# Patient Record
Sex: Male | Born: 1965
Health system: Southern US, Community
[De-identification: ages and names within clinical notes are randomized; demographics above are authoritative.]

## PROBLEM LIST (undated history)

## (undated) DIAGNOSIS — K219 Gastro-esophageal reflux disease without esophagitis: Secondary | ICD-10-CM

## (undated) DIAGNOSIS — E785 Hyperlipidemia, unspecified: Secondary | ICD-10-CM

## (undated) DIAGNOSIS — E119 Type 2 diabetes mellitus without complications: Secondary | ICD-10-CM

## (undated) DIAGNOSIS — I10 Essential (primary) hypertension: Secondary | ICD-10-CM

## (undated) HISTORY — PX: NO PAST SURGERIES: SHX2092

## (undated) HISTORY — DX: Hyperlipidemia, unspecified: E78.5

## (undated) HISTORY — PX: WISDOM TOOTH EXTRACTION: SHX21

---

## 1997-11-13 ENCOUNTER — Emergency Department (HOSPITAL_COMMUNITY): Admission: EM | Admit: 1997-11-13 | Discharge: 1997-11-13 | Payer: Self-pay | Admitting: Emergency Medicine

## 2000-07-29 ENCOUNTER — Emergency Department (HOSPITAL_COMMUNITY): Admission: EM | Admit: 2000-07-29 | Discharge: 2000-07-29 | Payer: Self-pay | Admitting: Emergency Medicine

## 2000-07-29 ENCOUNTER — Encounter: Payer: Self-pay | Admitting: Emergency Medicine

## 2003-08-03 ENCOUNTER — Emergency Department (HOSPITAL_COMMUNITY): Admission: EM | Admit: 2003-08-03 | Discharge: 2003-08-03 | Payer: Self-pay | Admitting: Emergency Medicine

## 2004-03-24 ENCOUNTER — Emergency Department (HOSPITAL_COMMUNITY): Admission: EM | Admit: 2004-03-24 | Discharge: 2004-03-24 | Payer: Self-pay | Admitting: Emergency Medicine

## 2004-04-01 ENCOUNTER — Emergency Department (HOSPITAL_COMMUNITY): Admission: EM | Admit: 2004-04-01 | Discharge: 2004-04-01 | Payer: Self-pay | Admitting: Family Medicine

## 2004-08-10 ENCOUNTER — Emergency Department (HOSPITAL_COMMUNITY): Admission: EM | Admit: 2004-08-10 | Discharge: 2004-08-10 | Payer: Self-pay | Admitting: Emergency Medicine

## 2005-03-11 ENCOUNTER — Emergency Department (HOSPITAL_COMMUNITY): Admission: EM | Admit: 2005-03-11 | Discharge: 2005-03-11 | Payer: Self-pay | Admitting: Emergency Medicine

## 2006-01-25 ENCOUNTER — Emergency Department (HOSPITAL_COMMUNITY): Admission: EM | Admit: 2006-01-25 | Discharge: 2006-01-25 | Payer: Self-pay | Admitting: Emergency Medicine

## 2006-01-27 ENCOUNTER — Emergency Department (HOSPITAL_COMMUNITY): Admission: EM | Admit: 2006-01-27 | Discharge: 2006-01-27 | Payer: Self-pay | Admitting: Emergency Medicine

## 2006-04-27 ENCOUNTER — Emergency Department (HOSPITAL_COMMUNITY): Admission: EM | Admit: 2006-04-27 | Discharge: 2006-04-27 | Payer: Self-pay | Admitting: Emergency Medicine

## 2007-04-18 ENCOUNTER — Emergency Department (HOSPITAL_COMMUNITY): Admission: EM | Admit: 2007-04-18 | Discharge: 2007-04-18 | Payer: Self-pay | Admitting: Emergency Medicine

## 2007-09-14 ENCOUNTER — Emergency Department (HOSPITAL_COMMUNITY): Admission: EM | Admit: 2007-09-14 | Discharge: 2007-09-14 | Payer: Self-pay | Admitting: Emergency Medicine

## 2009-01-01 ENCOUNTER — Emergency Department (HOSPITAL_COMMUNITY): Admission: EM | Admit: 2009-01-01 | Discharge: 2009-01-01 | Payer: Self-pay | Admitting: Emergency Medicine

## 2009-03-17 ENCOUNTER — Emergency Department (HOSPITAL_COMMUNITY): Admission: EM | Admit: 2009-03-17 | Discharge: 2009-03-17 | Payer: Self-pay | Admitting: Emergency Medicine

## 2010-05-21 LAB — GC/CHLAMYDIA PROBE AMP, GENITAL
Chlamydia, DNA Probe: NEGATIVE
GC Probe Amp, Genital: NEGATIVE

## 2010-11-10 LAB — URINE MICROSCOPIC-ADD ON

## 2010-11-10 LAB — URINALYSIS, ROUTINE W REFLEX MICROSCOPIC
Bilirubin Urine: NEGATIVE
Hgb urine dipstick: NEGATIVE
Specific Gravity, Urine: 1.023
pH: 5.5

## 2010-11-10 LAB — URINE CULTURE

## 2010-11-10 LAB — GC/CHLAMYDIA PROBE AMP, GENITAL: Chlamydia, DNA Probe: NEGATIVE

## 2010-11-14 LAB — COMPREHENSIVE METABOLIC PANEL
ALT: 56 — ABNORMAL HIGH
Alkaline Phosphatase: 92
BUN: 9
CO2: 26
GFR calc non Af Amer: >60
Glucose, Bld: 118 — ABNORMAL HIGH
Potassium: 3.9
Total Bilirubin: 1
Total Protein: 6.8

## 2010-11-14 LAB — CBC
HCT: 47.1
Hemoglobin: 15.8
RBC: 5.24
RDW: 13.2

## 2010-11-14 LAB — DIFFERENTIAL
Basophils Absolute: 0.1
Basophils Relative: 1
Eosinophils Absolute: 0.1
Monocytes Relative: 8
Neutro Abs: 7.2
Neutrophils Relative %: 67

## 2010-11-14 LAB — LIPASE, BLOOD: Lipase: 18

## 2010-11-14 LAB — POCT CARDIAC MARKERS: Myoglobin, poc: 55.4

## 2010-11-14 LAB — D-DIMER, QUANTITATIVE: D-Dimer, Quant: 0.25

## 2011-07-13 ENCOUNTER — Encounter (HOSPITAL_COMMUNITY): Payer: Self-pay | Admitting: Emergency Medicine

## 2011-07-13 ENCOUNTER — Emergency Department (HOSPITAL_COMMUNITY)
Admission: EM | Admit: 2011-07-13 | Discharge: 2011-07-13 | Disposition: A | Discharge status: Home / Self Care | Payer: Self-pay | Attending: Emergency Medicine | Admitting: Emergency Medicine

## 2011-07-13 DIAGNOSIS — I10 Essential (primary) hypertension: Secondary | ICD-10-CM | POA: Insufficient documentation

## 2011-07-13 DIAGNOSIS — M545 Low back pain, unspecified: Secondary | ICD-10-CM | POA: Insufficient documentation

## 2011-07-13 DIAGNOSIS — Y92410 Unspecified street and highway as the place of occurrence of the external cause: Secondary | ICD-10-CM | POA: Insufficient documentation

## 2011-07-13 HISTORY — DX: Essential (primary) hypertension: I10

## 2011-07-13 MED ORDER — CYCLOBENZAPRINE HCL 10 MG PO TABS: 10.0000 mg | ORAL_TABLET | Freq: Two times a day (BID) | ORAL | Status: AC | PRN

## 2011-07-13 MED ORDER — ACETAMINOPHEN 325 MG PO TABS
650.0000 mg | ORAL_TABLET | Freq: Once | ORAL | Status: AC
Start: 2011-07-13 — End: 2011-07-13
  Administered 2011-07-13: 650 mg via ORAL
  Filled 2011-07-13: qty 2

## 2011-07-13 MED ORDER — HYDROCODONE-ACETAMINOPHEN 5-325 MG PO TABS: 1.0000 | ORAL_TABLET | ORAL | Status: AC | PRN
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
## 2011-07-13 NOTE — Discharge Instructions (Signed)
Back Exercises   Back exercises help treat and prevent back injuries. The goal of back exercises is to increase the strength of your abdominal and back muscles and the flexibility of your back. These exercises should be started when you no longer have back pain. Back exercises include:   Pelvic Tilt. Lie on your back with your knees bent. Tilt your pelvis until the lower part of your back is against the floor. Hold this position 5 to 10 sec and repeat 5 to 10 times.   Knee to Chest. Pull first 1 knee up against your chest and hold for 20 to 30 seconds, repeat this with the other knee, and then both knees. This may be done with the other leg straight or bent, whichever feels better.   Sit-Ups or Curl-Ups. Bend your knees 90 degrees. Start with tilting your pelvis, and do a partial, slow sit-up, lifting your trunk only 30 to 45 degrees off the floor. Take at least 2 to 3 seconds for each sit-up. Do not do sit-ups with your knees out straight. If partial sit-ups are difficult, simply do the above but with only tightening your abdominal muscles and holding it as directed.   Hip-Lift. Lie on your back with your knees flexed 90 degrees. Push down with your feet and shoulders as you raise your hips a couple inches off the floor; hold for 10 seconds, repeat 5 to 10 times.   Back arches. Lie on your stomach, propping yourself up on bent elbows. Slowly press on your hands, causing an arch in your low back. Repeat 3 to 5 times. Any initial stiffness and discomfort should lessen with repetition over time.   Shoulder-Lifts. Lie face down with arms beside your body. Keep hips and torso pressed to floor as you slowly lift your head and shoulders off the floor.   Do not overdo your exercises, especially in the beginning. Exercises may cause you some mild back discomfort which lasts for a few minutes; however, if the pain is more severe, or lasts for more than 15 minutes, do not continue exercises until you see your caregiver.  Improvement with exercise therapy for back problems is slow.   See your caregivers for assistance with developing a proper back exercise program.   Document Released: 03/12/2004 Document Revised: 01/22/2011 Document Reviewed: 02/02/2005   Exit[employer]® Patient Information ©2012 [employer].     Back Pain, Adult   Low back pain is very common. About 1 in 5 people have back pain. The cause of low back pain is rarely dangerous. The pain often gets better over time. About half of people with a sudden onset of back pain feel better in just 2 weeks. About 8 in 10 people feel better by 6 weeks.   CAUSES   Some common causes of back pain include:   Strain of the muscles or ligaments supporting the spine.   Wear and tear (degeneration) of the spinal discs.   Arthritis.   Direct injury to the back.   DIAGNOSIS   Most of the time, the direct cause of low back pain is not known. However, back pain can be treated effectively even when the exact cause of the pain is unknown. Answering your caregiver's questions about your overall health and symptoms is one of the most accurate ways to make sure the cause of your pain is not dangerous. If your caregiver needs more information, he or she may order lab work or imaging tests (X-rays or MRIs). However, even   if imaging tests show changes in your back, this usually does not require surgery.   HOME CARE INSTRUCTIONS   For many people, back pain returns. Since low back pain is rarely dangerous, it is often a condition that people can learn to manage on their own.   Remain active. It is stressful on the back to sit or stand in one place. Do not sit, drive, or stand in one place for more than 30 minutes at a time. Take short walks on level surfaces as soon as pain allows. Try to increase the length of time you walk each day.   Do not stay in bed. Resting more than 1 or 2 days can delay your recovery.   Do not avoid exercise or work. Your body is made to move. It is not dangerous to be active,  even though your back may hurt. Your back will likely heal faster if you return to being active before your pain is gone.   Pay attention to your body when you bend and lift. Many people have less discomfort when lifting if they bend their knees, keep the load close to their bodies, and avoid twisting. Often, the most comfortable positions are those that put less stress on your recovering back.   Find a comfortable position to sleep. Use a firm mattress and lie on your side with your knees slightly bent. If you lie on your back, put a pillow under your knees.   Only take over-the-counter or prescription medicines as directed by your caregiver. Over-the-counter medicines to reduce pain and inflammation are often the most helpful. Your caregiver may prescribe muscle relaxant drugs. These medicines help dull your pain so you can more quickly return to your normal activities and healthy exercise.   Put ice on the injured area.   Put ice in a plastic bag.   Place a towel between your skin and the bag.   Leave the ice on for 15 to 20 minutes, 3 to 4 times a day for the first 2 to 3 days. After that, ice and heat may be alternated to reduce pain and spasms.   Ask your caregiver about trying back exercises and gentle massage. This may be of some benefit.   Avoid feeling anxious or stressed. Stress increases muscle tension and can worsen back pain. It is important to recognize when you are anxious or stressed and learn ways to manage it. Exercise is a great option.   SEEK MEDICAL CARE IF:   You have pain that is not relieved with rest or medicine.   You have pain that does not improve in 1 week.   You have new symptoms.   You are generally not feeling well.   SEEK IMMEDIATE MEDICAL CARE IF:   You have pain that radiates from your back into your legs.   You develop new bowel or bladder control problems.   You have unusual weakness or numbness in your arms or legs.   You develop nausea or vomiting.   You develop abdominal  pain.   You feel faint.   Document Released: 02/02/2005 Document Revised: 01/22/2011 Document Reviewed: 06/23/2010   ExitCare® Patient Information ©2012 [employer].
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
## 2011-07-15 NOTE — ED Provider Notes (Signed)
Medical screening examination/treatment/procedure(s) were performed by non-physician practitioner and as supervising physician I was immediately available for consultation/collaboration.  Flint Melter, MD 07/15/11 [phone]

## 2012-07-09 ENCOUNTER — Emergency Department (HOSPITAL_COMMUNITY): Admission: EM | Admit: 2012-07-09 | Discharge: 2012-07-09 | Discharge status: Against Medical Advice | Payer: Self-pay

## 2012-07-09 NOTE — ED Notes (Signed)
Registration clerk states that pt did not want to wait to be seen and left ED.

## 2016-02-04 ENCOUNTER — Emergency Department (HOSPITAL_COMMUNITY): Payer: No Typology Code available for payment source

## 2016-02-04 ENCOUNTER — Emergency Department (HOSPITAL_COMMUNITY)
Admission: EM | Admit: 2016-02-04 | Discharge: 2016-02-04 | Disposition: A | Discharge status: Home / Self Care | Payer: No Typology Code available for payment source | Attending: Emergency Medicine | Admitting: Emergency Medicine

## 2016-02-04 ENCOUNTER — Encounter (HOSPITAL_COMMUNITY): Payer: Self-pay

## 2016-02-04 DIAGNOSIS — Y939 Activity, unspecified: Secondary | ICD-10-CM | POA: Insufficient documentation

## 2016-02-04 DIAGNOSIS — I10 Essential (primary) hypertension: Secondary | ICD-10-CM | POA: Diagnosis not present

## 2016-02-04 DIAGNOSIS — F17210 Nicotine dependence, cigarettes, uncomplicated: Secondary | ICD-10-CM | POA: Diagnosis not present

## 2016-02-04 DIAGNOSIS — Y999 Unspecified external cause status: Secondary | ICD-10-CM | POA: Diagnosis not present

## 2016-02-04 DIAGNOSIS — Y92481 Parking lot as the place of occurrence of the external cause: Secondary | ICD-10-CM | POA: Diagnosis not present

## 2016-02-04 DIAGNOSIS — M25512 Pain in left shoulder: Secondary | ICD-10-CM | POA: Insufficient documentation

## 2016-02-04 MED ORDER — METHOCARBAMOL 500 MG PO TABS: 500.0000 mg | ORAL_TABLET | Freq: Two times a day (BID) | ORAL | 0 refills | Status: DC

## 2016-02-04 MED ORDER — LIDOCAINE 5 % EX PTCH: 1.0000 | MEDICATED_PATCH | CUTANEOUS | 0 refills | Status: DC

## 2016-02-04 MED ORDER — NAPROXEN 500 MG PO TABS: 500.0000 mg | ORAL_TABLET | Freq: Two times a day (BID) | ORAL | 0 refills | Status: DC

## 2016-02-04 MED ORDER — IBUPROFEN 200 MG PO TABS
600.0000 mg | ORAL_TABLET | Freq: Once | ORAL | Status: AC
  Administered 2016-02-04: 600 mg via ORAL
  Filled 2016-02-04: qty 3
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
## 2016-02-04 NOTE — Discharge Instructions (Signed)
There were no abnormalities on the x-ray today. Expect your soreness to increase over the next 2-3 days. Take it easy, but do not lay around too much as this may make any stiffness worse. Take 500 mg of naproxen every 12 hours or 800 mg of ibuprofen every 8 hours for the next 3 days. Take these medications with food to avoid upset stomach. Robaxin is a muscle relaxer and may help loosen stiff muscles. Do not take the Robaxin while driving or performing other dangerous activities. Be sure to perform the attached exercises starting with three times a week and working up to performing them daily. This is an essential part of preventing long term problems. Follow up with a primary care provider for any future management of these complaints.

## 2017-02-11 ENCOUNTER — Encounter: Payer: Self-pay | Admitting: Specialist

## 2017-03-15 ENCOUNTER — Emergency Department (HOSPITAL_COMMUNITY): Payer: BLUE CROSS/BLUE SHIELD

## 2017-03-15 ENCOUNTER — Encounter (HOSPITAL_COMMUNITY): Payer: Self-pay | Admitting: Emergency Medicine

## 2017-03-15 ENCOUNTER — Inpatient Hospital Stay (HOSPITAL_COMMUNITY)
Admission: EM | Admit: 2017-03-15 | Discharge: 2017-03-19 | DRG: 645 | Disposition: A | Discharge status: Home / Self Care | Payer: BLUE CROSS/BLUE SHIELD | Attending: Internal Medicine | Admitting: Internal Medicine

## 2017-03-15 DIAGNOSIS — D497 Neoplasm of unspecified behavior of endocrine glands and other parts of nervous system: Secondary | ICD-10-CM | POA: Diagnosis not present

## 2017-03-15 DIAGNOSIS — M21372 Foot drop, left foot: Secondary | ICD-10-CM | POA: Diagnosis present

## 2017-03-15 DIAGNOSIS — I10 Essential (primary) hypertension: Secondary | ICD-10-CM | POA: Diagnosis not present

## 2017-03-15 DIAGNOSIS — M899 Disorder of bone, unspecified: Secondary | ICD-10-CM

## 2017-03-15 DIAGNOSIS — Z79899 Other long term (current) drug therapy: Secondary | ICD-10-CM

## 2017-03-15 DIAGNOSIS — F17210 Nicotine dependence, cigarettes, uncomplicated: Secondary | ICD-10-CM | POA: Diagnosis present

## 2017-03-15 DIAGNOSIS — R209 Unspecified disturbances of skin sensation: Secondary | ICD-10-CM

## 2017-03-15 DIAGNOSIS — E1142 Type 2 diabetes mellitus with diabetic polyneuropathy: Secondary | ICD-10-CM | POA: Diagnosis present

## 2017-03-15 DIAGNOSIS — G9589 Other specified diseases of spinal cord: Secondary | ICD-10-CM | POA: Diagnosis present

## 2017-03-15 DIAGNOSIS — F1010 Alcohol abuse, uncomplicated: Secondary | ICD-10-CM | POA: Diagnosis not present

## 2017-03-15 DIAGNOSIS — E119 Type 2 diabetes mellitus without complications: Secondary | ICD-10-CM

## 2017-03-15 DIAGNOSIS — Z794 Long term (current) use of insulin: Secondary | ICD-10-CM

## 2017-03-15 DIAGNOSIS — M21371 Foot drop, right foot: Secondary | ICD-10-CM

## 2017-03-15 DIAGNOSIS — R531 Weakness: Secondary | ICD-10-CM | POA: Diagnosis not present

## 2017-03-15 DIAGNOSIS — E1165 Type 2 diabetes mellitus with hyperglycemia: Secondary | ICD-10-CM | POA: Diagnosis present

## 2017-03-15 DIAGNOSIS — IMO0001 Reserved for inherently not codable concepts without codable children: Secondary | ICD-10-CM

## 2017-03-15 HISTORY — DX: Type 2 diabetes mellitus without complications: E11.9

## 2017-03-15 LAB — BASIC METABOLIC PANEL
ANION GAP: 6 (ref 5–15)
BUN: 13 mg/dL (ref 6–20)
CO2: 27 mmol/L (ref 22–32)
Calcium: 9.5 mg/dL (ref 8.9–10.3)
Chloride: 106 mmol/L (ref 101–111)
Creatinine, Ser: 0.67 mg/dL (ref 0.61–1.24)
Glucose, Bld: 208 mg/dL — ABNORMAL HIGH (ref 65–99)
POTASSIUM: 3.8 mmol/L (ref 3.5–5.1)
SODIUM: 139 mmol/L (ref 135–145)

## 2017-03-15 LAB — MAGNESIUM: MAGNESIUM: 1.9 mg/dL (ref 1.7–2.4)

## 2017-03-15 LAB — CBC WITH DIFFERENTIAL/PLATELET
BASOS ABS: 0 10*3/uL (ref 0.0–0.1)
BASOS PCT: 0 %
EOS ABS: 0.1 10*3/uL (ref 0.0–0.7)
EOS PCT: 1 %
HCT: 43.4 % (ref 39.0–52.0)
Hemoglobin: 14.6 g/dL (ref 13.0–17.0)
LYMPHS ABS: 3.3 10*3/uL (ref 0.7–4.0)
LYMPHS PCT: 34 %
MCH: 29.5 pg (ref 26.0–34.0)
MCHC: 33.6 g/dL (ref 30.0–36.0)
MCV: 87.7 fL (ref 78.0–100.0)
Monocytes Absolute: 0.8 10*3/uL (ref 0.1–1.0)
Monocytes Relative: 8 %
NEUTROS ABS: 5.3 10*3/uL (ref 1.7–7.7)
NEUTROS PCT: 57 %
PLATELETS: 327 10*3/uL (ref 150–400)
RBC: 4.95 MIL/uL (ref 4.22–5.81)
RDW: 12 % (ref 11.5–15.5)
WBC: 9.5 10*3/uL (ref 4.0–10.5)

## 2017-03-15 LAB — ETHANOL: Alcohol, Ethyl (B): <10 mg/dL (ref <10)

## 2017-03-15 LAB — VITAMIN B12: Vitamin B-12: 1224 pg/mL — ABNORMAL HIGH (ref 180–914)

## 2017-03-15 LAB — CK: Total CK: 77 U/L (ref 49–397)

## 2017-03-15 MED ORDER — GADOBENATE DIMEGLUMINE 529 MG/ML IV SOLN
20.0000 mL | Freq: Once | INTRAVENOUS | Status: AC | PRN
  Administered 2017-03-15: 17 mL via INTRAVENOUS

## 2017-03-15 NOTE — ED Notes (Addendum)
MRI states unable to do the 3 MRIs ordered tonight due to the fact that pt has already had contrast, so must be done tomorrow

## 2017-03-15 NOTE — ED Notes (Signed)
Patient transported to MRI 

## 2017-03-15 NOTE — ED Provider Notes (Signed)
[facility] DEPT Provider Note   CSN: [id number] Arrival date & time: 03/15/17  1304     History   Chief Complaint Chief Complaint  Patient presents with  . Foot Pain    HPI Kyle Novak is a 52 y.o. male with a PMHx of HTN and DM2, who presents to the ED with complaints of bilateral feet and leg paresthesias x 1 month with associated left worse than right foot drop/weakness.  Patient states that one month ago he started having paresthesias and tingling in his feet which occasionally radiate up to his thighs, describes the pain as 6/10 constant tingling in both feet, which seems to worsen when he drinks alcohol, with no treatments tried prior to arrival and no known alleviating factors.  He states that he started noticing that when he walks he has to lift his legs up high because his feet won't come up properly and he tends to slap them down on the ground when he walks.  He has been having worsening weakness in his left foot more than his right, but both feet seem to not dorsiflex properly, and occasionally he'll trip and fall when his feet don't clear the ground fully.  He is compliant with his Tresiba 22 units every evening as well as his Hyzaar, he is not on any other medications at this time.  He denies any injuries or trauma to his feet, legs, or back.  He states that he had some back pain last week, but that self resolved.  He admits that he drinks alcohol almost daily, usually about 2-4 shots of liquor and a few beers, occasionally he will have more up to a pint of liquor in addition to a few beers.  His PCP is at the [facility] clinic.  Of note, he has an appt with a neurologist on 03/31/17 for this issue, that his PCP made him; he's never seen a neurologist before, and he doesn't know the name of the neurologist that he's got the appt with next month.    He denies fevers, chills, CP, SOB, abd pain, N/V/D/C, hematuria, dysuria, incontinence of urine/stool,  saddle anesthesia/cauda equina symptoms, focal myalgias/arthralgias, complete numbness, or any other complaints at this time.   The history is provided by the patient and medical records. No language interpreter was used.  Foot Pain  This is a new problem. The current episode started more than 1 week ago. The problem occurs constantly. The problem has been gradually worsening. Pertinent negatives include no chest pain, no abdominal pain and no shortness of breath. The symptoms are aggravated by drinking. Nothing relieves the symptoms. He has tried nothing for the symptoms. The treatment provided no relief.    Past Medical History:  Diagnosis Date  . Diabetes mellitus without complication ([place])   . Hypertension     There are no active problems to display for this patient.   History reviewed. No pertinent surgical history.     Home Medications    Prior to Admission medications   Medication Sig Start Date End Date Taking? Authorizing Provider  losartan-hydrochlorothiazide (HYZAAR) 100-12.5 MG tablet Take 1 tablet by mouth daily. 01/22/17  Yes [provider]  Multiple Vitamin (MULITIVITAMIN WITH MINERALS) TABS Take 1 tablet by mouth daily.   Yes [provider]  TRESIBA FLEXTOUCH 200 UNIT/ML SOPN Inject 1 each into the skin daily. 01/22/17  Yes [provider]  lidocaine (LIDODERM) 5 % Place 1 patch onto the skin daily. Remove &  Discard patch within 12 hours or as directed by MD Patient not taking: Reported on 03/15/2017 02/04/16   Joy, Helane Gunther, PA-C  methocarbamol (ROBAXIN) 500 MG tablet Take 1 tablet (500 mg total) by mouth 2 (two) times daily. Patient not taking: Reported on 03/15/2017 02/04/16   Joy, Raquel Sarna C, PA-C  naproxen (NAPROSYN) 500 MG tablet Take 1 tablet (500 mg total) by mouth 2 (two) times daily. Patient not taking: Reported on 03/15/2017 02/04/16   Lorayne Bender, PA-C    Family History No family history on file.  Social History Social History     Tobacco Use  . Smoking status: Current Every Day Smoker    Types: Cigarettes  . Smokeless tobacco: Never Used  Substance Use Topics  . Alcohol use: Yes    Comment: OCCASIONAL  . Drug use: No     Allergies   Patient has no known allergies.   Review of Systems Review of Systems  Constitutional: Negative for chills and fever.  Respiratory: Negative for shortness of breath.   Cardiovascular: Negative for chest pain.  Gastrointestinal: Negative for abdominal pain, constipation, diarrhea, nausea and vomiting.  Genitourinary: Negative for difficulty urinating (no incontinence), dysuria and hematuria.  Musculoskeletal: Positive for gait problem. Negative for arthralgias, back pain and myalgias.  Skin: Negative for color change.  Allergic/Immunologic: Positive for immunocompromised state (DM2).  Neurological: Positive for weakness (feet/legs) and numbness (paresthesias feet/legs).  Psychiatric/Behavioral: Negative for confusion.   All other systems reviewed and are negative for acute change except as noted in the HPI.    Physical Exam Updated Vital Signs BP (!) 138/104 (BP Location: Left Arm)   Pulse 98   Temp 98.2 F (36.8 C) (Oral)   Resp 16   SpO2 100%   Physical Exam  Constitutional: He is oriented to person, place, and time. Vital signs are normal. He appears well-developed and well-nourished.  Non-toxic appearance. No distress.  Afebrile, nontoxic, NAD  HENT:  Head: Normocephalic and atraumatic.  Mouth/Throat: Oropharynx is clear and moist and mucous membranes are normal.  Eyes: Conjunctivae and EOM are normal. Right eye exhibits no discharge. Left eye exhibits no discharge.  Neck: Normal range of motion. Neck supple.  Cardiovascular: Normal rate, regular rhythm, normal heart sounds and intact distal pulses. Exam reveals no gallop and no friction rub.  No murmur heard. Pulmonary/Chest: Effort normal and breath sounds normal. No respiratory distress. He has no  decreased breath sounds. He has no wheezes. He has no rhonchi. He has no rales.  Abdominal: Soft. Normal appearance and bowel sounds are normal. He exhibits no distension. There is no tenderness. There is no rigidity, no rebound, no guarding, no CVA tenderness, no tenderness at McBurney's point and negative Murphy's sign.  Musculoskeletal: Normal range of motion.       Lumbar back: Normal.  All spinal levels with FROM intact without spinous process TTP, no bony stepoffs or deformities, no paraspinous muscle TTP or muscle spasms. No overlying skin changes. Neg SLR bilaterally. No focal bony TTP to BLEs, no swelling or crepitus, no deformities. No tenderness to fibular head region.  B/l foot drop with weakness on dorsiflexion L>R, plantarflexion strength 5/5 bilaterally, R great toe extension 4/5 but L great toe extension 3/5, globally diminished sensation on dorsums of feet and up b/l legs without specific dermatomal distribution, sensation on plantar aspects of feet intact, babinski's downgoing bilaterally. Knee/hip strength grossly intact, distal pulses intact. Soft compartments. Pt has to lift legs up higher at from hips  in order to clear his feet when ambulating due to the foot drop.   Neurological: He is alert and oriented to person, place, and time. A sensory deficit is present. Gait abnormal. He displays no Babinski's sign on the right side. He displays no Babinski's sign on the left side.  See MsK exam  Skin: Skin is warm, dry and intact. No rash noted.  Psychiatric: He has a normal mood and affect.  Nursing note and vitals reviewed.    ED Treatments / Results  Labs (all labs ordered are listed, but only abnormal results are displayed) Labs Reviewed  BASIC METABOLIC PANEL - Abnormal; Notable for the following components:      Result Value   Glucose, Bld 208 (*)    All other components within normal limits  CBC WITH DIFFERENTIAL/PLATELET  MAGNESIUM  CK  ETHANOL  VITAMIN B12     EKG  EKG Interpretation None       Radiology Mr Lumbar Spine W Wo Contrast  Result Date: 03/15/2017 CLINICAL DATA:  Back pain for 1 month. Multiple falls. Tingling in both legs. EXAM: MRI LUMBAR SPINE WITHOUT AND WITH CONTRAST TECHNIQUE: Multiplanar and multiecho pulse sequences of the lumbar spine were obtained without and with intravenous contrast. CONTRAST:  82mL MULTIHANCE GADOBENATE DIMEGLUMINE 529 MG/ML IV SOLN COMPARISON:  None. FINDINGS: Segmentation:  Standard. Alignment:  Anatomic. Vertebrae:  No worrisome osseous lesion. Conus medullaris and cauda equina: Conus extends to the L1 level. Normal signal and morphology to the conus. At the L2-3 level, there is an enhancing 5 mm spherical lesion, midline location intradural, which is adjacent to the filum terminale and adjacent to multiple cauda equina nerve roots. The lesion demonstrates intrinsic T1 and T2 hypointensity, with a small area of suspected central necrosis. No other similar lesions are seen. Paraspinal and other soft tissues: Unremarkable. Disc levels: L1-L2:  Normal. L2-L3:  Normal. L3-L4:  Normal disc space.  Mild facet arthropathy.  No impingement. L4-L5:  Normal disc space.  Mild facet arthropathy.  No impingement. L5-S1: Central protrusion. Mild facet arthropathy. No impingement. IMPRESSION: Enhancing intradural midline 5 mm spherical lesion, adjacent to the filum terminale and multiple cauda equina nerve roots. 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 Short-term follow-up 3-6 months recommended with repeat lumbar MRI without and with contrast. In addition, elective, not urgent, neurosurgical consultation may be warranted. L5-S1 central protrusion.  No impingement. Electronically Signed   By: Staci Righter M.D.   On: 03/15/2017 20:47    Procedures Procedures (including critical care time)  Medications Ordered in ED Medications   gadobenate dimeglumine (MULTIHANCE) injection 20 mL (17 mLs Intravenous Contrast Given 03/15/17 2025)     Initial Impression / Assessment and Plan / ED Course  I have reviewed the triage vital signs and the nursing notes.  Pertinent labs & imaging results that were available during my care of the patient were reviewed by me and considered in my medical decision making (see chart for details).     52 y.o. male here with 1 month of b/l feet and leg paresthesias and L>R foot weakness/foot drop. Admits to drinking alcohol often, and feels as though alcohol makes his symptoms worse. Has been falling because his feet don't lift up all the way and he trips. He denies any back pain or cord compression symptoms. On exam, b/l foot drop with weakness on dorsiflexion L>R, plantarflexion strength 5/5, R great toe extension  4/5 but L great toe extension 3/5, globally diminished sensation on dorsums of feet and up legs without dermatomal distribution, sensation on plantar aspects of feet intact; no focal bony TTP; no swelling/crepitus/deformity. No midline spinal TTP. Babinski's downgoing. Pt lifts legs up at the hip to ambulate in order to clear his feet which have foot drop. Very odd presentation, could be neuropathy from diabetes but this shouldn't cause motor issue; or from alcoholism vs vitamin deficiency. Will get labs and lumbar MRI and reassess shortly. Pt declines anything for pain at this time. Discussed case with my attending Dr. Tomi Bamberger who agrees with plan.   9:28 PM CBC w/diff WNL. BMP with hyperglycemia 208 but otherwise unremarkable, with no anion gap or abnormal bicarb. Mg level WNL. CK WNL. EtOH level undetectable. Vitamin B12 pending. MRI lumbar showing enhancing intradural midline 73mm spherical lesion adjacent to the filum terminale and multiple cauda equina nerve roots, could represent schwannoma vs ependymoma vs drop mets; also with L5-S1 central disc protrusion but no impingement. This lesion  could possibly contribute to his paresthesias, but I'm not sure this would explain is b/l foot drop. Will consult neurology to discuss further.   9:42 PM Dr. Cheral Marker of neurology returning page, reviewed images and says this lesion would best explain the symptoms he's having, and could represent a cancerous mass; DDx also includes guillain barre vs CIDP/AIDP vs atypical alcoholic neuropathy although all of these are felt to be less likely; recommends neurosurgical consult and likely admission, wants MRI brain/cervical/thoracic without contrast now since he just got contrast so these will essentially be contrast studies, and he'll likely need LP w/ cell count and diff, protein, glucose, no gram stain, flow cytology, and epstein barr virus labs. Given that he will need admission for this work up, will hold off on LP now, and instead proceed with the MRI brain/cervical/thoracic to try to get those done ASAP. Will consult neurosurgeon now. Dr. Cheral Marker of neurology reports he will consult on pt while he's admitted.   10:05 PM Dr. Cyndy Freeze of neurosurgery returning page and will consult on pt tomorrow and follow along during pt's admission. Will proceed with admission via hospitalist.   10:22 PM Megan of radiology calling stating that because the pt has had contrast he has to wait 24hrs before the brain/cervical/thoracic MRIs can be done, because the protocol would be to have it with and without contrast and since he's had the contrast, he won't have the without contrast images to compare to; she spoke to radiologist who stated that this would not be able to be done tonight due to this issue; the MRI tech also states that they leave now so it wouldn't be able to be done anyway. Will let Dr. Cheral Marker know this issue.   10:31 PM Dr. Cheral Marker calling back about the MRI issue; wants them ordered for 24hrs after his MRI from today (so tomorrow at 8pm) for with and without contrast, still of brain/cervical/thoracic. I  have placed these orders in the system. Still awaiting admission call.  10:46 PM Dr. Aggie Moats of [facility] returning page and will admit. Holding orders to be placed by admitting team. Please see their notes for further documentation of care. I appreciate their help with this pleasant pt's care. Pt stable at time of admission.    Final Clinical Impressions(s) / ED Diagnoses   Final diagnoses:  Paresthesias/numbness  Bilateral foot-drop  Type 2 diabetes mellitus with hyperglycemia, with long-term current use of insulin ([place])  Alcohol abuse  Lesion of lumbar spine    ED Discharge Orders    [address] [place], [place] 03/15/17 2246    Dorie Rank, MD 03/16/17 0002

## 2017-03-15 NOTE — ED Triage Notes (Signed)
Patient c/o tingling to bilateral feet x1 month since being diagnosed with diabetes. Pulses present in bilateral feet with cap refill < 3 seconds. Ambulatory.

## 2017-03-16 ENCOUNTER — Encounter (HOSPITAL_COMMUNITY): Payer: Self-pay | Admitting: Family Medicine

## 2017-03-16 ENCOUNTER — Inpatient Hospital Stay (HOSPITAL_COMMUNITY): Payer: BLUE CROSS/BLUE SHIELD

## 2017-03-16 ENCOUNTER — Other Ambulatory Visit: Payer: Self-pay

## 2017-03-16 DIAGNOSIS — R531 Weakness: Secondary | ICD-10-CM | POA: Diagnosis present

## 2017-03-16 DIAGNOSIS — I10 Essential (primary) hypertension: Secondary | ICD-10-CM | POA: Diagnosis present

## 2017-03-16 DIAGNOSIS — E119 Type 2 diabetes mellitus without complications: Secondary | ICD-10-CM

## 2017-03-16 DIAGNOSIS — M21372 Foot drop, left foot: Secondary | ICD-10-CM | POA: Diagnosis present

## 2017-03-16 DIAGNOSIS — Z794 Long term (current) use of insulin: Secondary | ICD-10-CM | POA: Diagnosis not present

## 2017-03-16 DIAGNOSIS — M899 Disorder of bone, unspecified: Secondary | ICD-10-CM | POA: Diagnosis not present

## 2017-03-16 DIAGNOSIS — F17210 Nicotine dependence, cigarettes, uncomplicated: Secondary | ICD-10-CM | POA: Diagnosis present

## 2017-03-16 DIAGNOSIS — M21371 Foot drop, right foot: Secondary | ICD-10-CM

## 2017-03-16 DIAGNOSIS — D497 Neoplasm of unspecified behavior of endocrine glands and other parts of nervous system: Principal | ICD-10-CM | POA: Diagnosis present

## 2017-03-16 DIAGNOSIS — E1165 Type 2 diabetes mellitus with hyperglycemia: Secondary | ICD-10-CM | POA: Diagnosis present

## 2017-03-16 DIAGNOSIS — G9589 Other specified diseases of spinal cord: Secondary | ICD-10-CM | POA: Diagnosis present

## 2017-03-16 DIAGNOSIS — E1142 Type 2 diabetes mellitus with diabetic polyneuropathy: Secondary | ICD-10-CM | POA: Diagnosis present

## 2017-03-16 DIAGNOSIS — Z79899 Other long term (current) drug therapy: Secondary | ICD-10-CM | POA: Diagnosis not present

## 2017-03-16 DIAGNOSIS — F1010 Alcohol abuse, uncomplicated: Secondary | ICD-10-CM | POA: Diagnosis present

## 2017-03-16 LAB — GLUCOSE, CAPILLARY
GLUCOSE-CAPILLARY: 120 mg/dL — AB (ref 65–99)
GLUCOSE-CAPILLARY: 132 mg/dL — AB (ref 65–99)
GLUCOSE-CAPILLARY: 260 mg/dL — AB (ref 65–99)
Glucose-Capillary: 92 mg/dL (ref 65–99)
Glucose-Capillary: 128 mg/dL — ABNORMAL HIGH (ref 65–99)
Glucose-Capillary: 126 mg/dL — ABNORMAL HIGH (ref 65–99)

## 2017-03-16 LAB — CBG MONITORING, ED: Glucose-Capillary: 251 mg/dL — ABNORMAL HIGH (ref 65–99)

## 2017-03-16 MED ORDER — INSULIN ASPART 100 UNIT/ML ~~LOC~~ SOLN
0.0000 [IU] | SUBCUTANEOUS | Status: DC
  Administered 2017-03-16: 2 [IU] via SUBCUTANEOUS
  Administered 2017-03-16: 8 [IU] via SUBCUTANEOUS
  Administered 2017-03-16: 2 [IU] via SUBCUTANEOUS
  Administered 2017-03-16: 8 [IU] via SUBCUTANEOUS
  Administered 2017-03-17 (×2): 3 [IU] via SUBCUTANEOUS
  Administered 2017-03-17 (×2): 2 [IU] via SUBCUTANEOUS
  Administered 2017-03-17: 11 [IU] via SUBCUTANEOUS
  Filled 2017-03-16: qty 1

## 2017-03-16 MED ORDER — LIDOCAINE HCL 1 % IJ SOLN
INTRAMUSCULAR | Status: AC
  Filled 2017-03-16: qty 20

## 2017-03-16 MED ORDER — FOLIC ACID 1 MG PO TABS
1.0000 mg | ORAL_TABLET | Freq: Every day | ORAL | Status: DC
  Administered 2017-03-16 – 2017-03-19 (×4): 1 mg via ORAL
  Filled 2017-03-16 (×4): qty 1

## 2017-03-16 MED ORDER — LORAZEPAM 1 MG PO TABS: 1.0000 mg | ORAL_TABLET | Freq: Four times a day (QID) | ORAL | Status: AC | PRN

## 2017-03-16 MED ORDER — THIAMINE HCL 100 MG/ML IJ SOLN
100.0000 mg | Freq: Every day | INTRAMUSCULAR | Status: DC
  Filled 2017-03-16: qty 1

## 2017-03-16 MED ORDER — HYDRALAZINE HCL 20 MG/ML IJ SOLN
10.0000 mg | Freq: Three times a day (TID) | INTRAMUSCULAR | Status: DC | PRN
  Administered 2017-03-17: 10 mg via INTRAVENOUS
  Filled 2017-03-16 (×2): qty 1

## 2017-03-16 MED ORDER — ADULT MULTIVITAMIN W/MINERALS CH
1.0000 | ORAL_TABLET | Freq: Every day | ORAL | Status: DC
  Administered 2017-03-16 – 2017-03-19 (×4): 1 via ORAL
  Filled 2017-03-16 (×4): qty 1

## 2017-03-16 MED ORDER — NICOTINE 14 MG/24HR TD PT24
14.0000 mg | MEDICATED_PATCH | Freq: Every day | TRANSDERMAL | Status: DC
  Administered 2017-03-16 – 2017-03-19 (×4): 14 mg via TRANSDERMAL
  Filled 2017-03-16 (×4): qty 1

## 2017-03-16 MED ORDER — INSULIN ASPART 100 UNIT/ML ~~LOC~~ SOLN: 0.0000 [IU] | Freq: Three times a day (TID) | SUBCUTANEOUS | Status: DC

## 2017-03-16 MED ORDER — LOSARTAN POTASSIUM 25 MG PO TABS
25.0000 mg | ORAL_TABLET | Freq: Every day | ORAL | Status: DC
  Administered 2017-03-17: 25 mg via ORAL
  Filled 2017-03-16: qty 1

## 2017-03-16 MED ORDER — VITAMIN B-1 100 MG PO TABS
100.0000 mg | ORAL_TABLET | Freq: Every day | ORAL | Status: DC
  Administered 2017-03-16 – 2017-03-19 (×4): 100 mg via ORAL
  Filled 2017-03-16 (×4): qty 1

## 2017-03-16 MED ORDER — GADOBENATE DIMEGLUMINE 529 MG/ML IV SOLN
15.0000 mL | Freq: Once | INTRAVENOUS | Status: AC | PRN
  Administered 2017-03-16: 15 mL via INTRAVENOUS

## 2017-03-16 MED ORDER — LORAZEPAM 2 MG/ML IJ SOLN: 1.0000 mg | Freq: Four times a day (QID) | INTRAMUSCULAR | Status: AC | PRN

## 2017-03-16 NOTE — Progress Notes (Addendum)
Patient ID: Kyle Novak, male   DOB: [birth year], 52 y.o.   MRN: [id number] Films have been reviewed. There is no compression of the neural elements. No anatomic explanation for weakness in the lower extremities. Need thoracic films to confirm lack of anatomic etiology.  The lesion in the lumbar region should simply be followed with serial imaging There is no role for a biopsy in this setting. This lesion cannot cause bilateral foot drops.

## 2017-03-16 NOTE — Consult Note (Addendum)
NEURO HOSPITALIST CONSULT NOTE   Requestig physician: Dr. Aggie Moats  Reason for Consult: Bilateral foot drop  History obtained from:   Patient and Chart     HPI:                                                                                                                                          Kyle Novak is an 52 y.o. male who presented to the [facility] ED on Monday with a chief complaint of bilateral foot drop, left worse than right. He first noticed initial subtle manifestations of foot drop before Thanksgiving, about mid-November. Prior to that, for about 1 year, he states that he would get an occasional crampy sensation of muscle tightness in his lower left leg, especially at night while trying to sleep. He has recently developed an intermittent crampy and aching sensation involving his left forearm. He states that the foot drop has gradually worsened since November. He has also noticed decreased muscle bulk of his legs below the knees. He has some sensory numbness involving the left leg as well. When he walks, he has to "lift my legs up" higher than normal to prevent his toes from dragging. He has not had any recent trauma.   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 Also noted is an L5-S1 central protrusion without nerve root impingement.  PMHx includes DM and HTN.   Past Medical History:  Diagnosis Date  . Diabetes mellitus without complication ([place])   . Hypertension     Past Surgical History:  Procedure Laterality Date  . NO PAST SURGERIES      Family History  Problem Relation Age of Onset  . Lung cancer Father    Social History:  reports that he has been smoking cigarettes.  he has never  used smokeless tobacco. He reports that he drinks alcohol. He reports that he does not use drugs.  No Known Allergies  MEDICATIONS:  Prior to Admission:  Medications Prior to Admission  Medication Sig Dispense Refill Last Dose  . losartan-hydrochlorothiazide (HYZAAR) 100-12.5 MG tablet Take 1 tablet by mouth daily.  0 03/15/2017 at Unknown time  . Multiple Vitamin (MULITIVITAMIN WITH MINERALS) TABS Take 1 tablet by mouth daily.   Past Week at Unknown time  . TRESIBA FLEXTOUCH 200 UNIT/ML SOPN Inject 1 each into the skin daily.  0 03/14/2017 at Unknown time  . lidocaine (LIDODERM) 5 % Place 1 patch onto the skin daily. Remove & Discard patch within 12 hours or as directed by MD (Patient not taking: Reported on 03/15/2017) 30 patch 0 Not Taking at Unknown time  . methocarbamol (ROBAXIN) 500 MG tablet Take 1 tablet (500 mg total) by mouth 2 (two) times daily. (Patient not taking: Reported on 03/15/2017) 20 tablet 0 Not Taking at Unknown time  . naproxen (NAPROSYN) 500 MG tablet Take 1 tablet (500 mg total) by mouth 2 (two) times daily. (Patient not taking: Reported on 03/15/2017) 30 tablet 0 Not Taking at Unknown time    ROS:                                                                                                                                       As per HPI.   Blood pressure (!) 140/103, pulse 79, temperature 98.3 F (36.8 C), temperature source Oral, resp. rate 20, height 5\' 8"  (1.727 m), weight 73.4 kg (161 lb 13.1 oz), SpO2 100 %.  General Examination:                                                                                                      HEENT-  [place]/AT. Normal external eye and conjunctiva.   Lungs - Respirations unlabored Extremities- No edema  Neurological Examination Mental Status: Alert, oriented, thought content appropriate.  Speech fluent without  evidence of aphasia.  Able to follow all commands without difficulty. No dysarthria Cranial Nerves: II: Visual fields normal bilaterally. PERRL.   III,IV, VI: EOMI without nystagmus. No ptosis.  V,VII: No facial droop. Temp sensation equal bilaterally VIII: hearing intact to voice IX,X: No hypophonia XI: Symmetric XII: No lingual dysarthria Motor: Bilateral upper extremities with normal tone and bulk. 5/5 bilaterally proximally and distally.  Bilateral lower extremities with decreased muscle bulk below knees bilaterally. HF, knee extension 5/5 bilaterally. Knee flexion 5/5 bilaterally. Ankle and toe dorsiflexion is 2/5 bilaterally, worse on the left. Ankle plantar flexion is 5/5 bilaterally.  Sensory: Decreased FT and  temp sensation to distal 1/3 of left thigh and entire left leg below the knee. Mildly decreased proprioception to toes bilaterally.   Sensation to RUE, RLE and LUE normal.  Deep Tendon Reflexes: 2+ bilateral brachioradialis and biceps. 1+ patellae bilaterally. 0 achilles bilaterally. Toes downgoing bilaterally.  Cerebellar: No ataxia with FNF bilaterally. Heel-shin is ataxic bilaterally, worse on the left.  Gait: Wide based steppage gait with foot drop noted bilaterally.    Lab Results: Basic Metabolic Panel: Recent Labs  Lab 03/15/17 1954  NA 139  K 3.8  CL 106  CO2 27  GLUCOSE 208*  BUN 13  CREATININE 0.67  CALCIUM 9.5  MG 1.9    CBC: Recent Labs  Lab 03/15/17 1954  WBC 9.5  NEUTROABS 5.3  HGB 14.6  HCT 43.4  MCV 87.7  PLT 327    Cardiac Enzymes: Recent Labs  Lab 03/15/17 1954  CKTOTAL 77    Lipid Panel: No results for input(s): CHOL, TRIG, HDL, CHOLHDL, VLDL, LDLCALC in the last 168 hours.  Imaging: Mr Lumbar Spine W Wo Contrast  Result Date: 03/15/2017 CLINICAL DATA:  Back pain for 1 month. Multiple falls. Tingling in both legs. EXAM: MRI LUMBAR SPINE WITHOUT AND WITH CONTRAST TECHNIQUE: Multiplanar and multiecho pulse sequences of the  lumbar spine were obtained without and with intravenous contrast. CONTRAST:  8mL MULTIHANCE GADOBENATE DIMEGLUMINE 529 MG/ML IV SOLN COMPARISON:  None. FINDINGS: Segmentation:  Standard. Alignment:  Anatomic. Vertebrae:  No worrisome osseous lesion. Conus medullaris and cauda equina: Conus extends to the L1 level. Normal signal and morphology to the conus. At the L2-3 level, there is an enhancing 5 mm spherical lesion, midline location intradural, which is adjacent to the filum terminale and adjacent to multiple cauda equina nerve roots. The lesion demonstrates intrinsic T1 and T2 hypointensity, with a small area of suspected central necrosis. No other similar lesions are seen. Paraspinal and other soft tissues: Unremarkable. Disc levels: L1-L2:  Normal. L2-L3:  Normal. L3-L4:  Normal disc space.  Mild facet arthropathy.  No impingement. L4-L5:  Normal disc space.  Mild facet arthropathy.  No impingement. L5-S1: Central protrusion. Mild facet arthropathy. No impingement. IMPRESSION: Enhancing intradural midline 5 mm spherical lesion, adjacent to the filum terminale and multiple cauda equina nerve roots. 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 Short-term follow-up 3-6 months recommended with repeat lumbar MRI without and with contrast. In addition, elective, not urgent, neurosurgical consultation may be warranted. L5-S1 central protrusion.  No impingement. Electronically Signed   By: Staci Righter M.D.   On: 03/15/2017 20:47    Assessment: 52 year old male presenting with a 2.5 month history of gradually progressive foot drop with steppage gait 1. Pertinent exam findings include bilateral foot drop with weak ankle and toe dorsiflexion at 2/5 bilaterally, worse on the left; decreased FT and temp sensation to distal 1/3 of left thigh and entire left leg below the knee; mildly decreased proprioception to toes bilaterally;  depressed lower extremity reflexes; lower extremity ataxia; wide based steppage gait.  2. Localization: Bilateral lower extremity ataxia, asymmetric large and small fiber sensory loss involving the LLE and foot drop have more than one localization. The lower extremity ataxia may be cerebellar but this is less likely as there is no upper extremity ataxia. More likely the ataxia is a sensory ataxia given the other sensory findings. The foot drop best localizes as isolated L5 radiculopathies bilaterally.  3. 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 DDx for underlying etiology includes paraneoplastic syndrome, drop metastasis and an autoimmune polyradiculoneuropathy such as CIDP.  4. MRI lumbar spine reveals an enhancing intradural midline 5 mm spherical lesion, adjacent to the filum terminale and multiple cauda equina nerve roots. 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 The lesion is seen anterior and directly abutting the nerve roots of the cauda equina at the midline; tracing the roots caudally suggests that the mass may be directly abutting the L5 nerve roots; the MRI is of insufficient resolution to rule in or rule out possible associated inflammation/infiltration of the contacted nerve roots. The lesion does not exert mass effect but the location suggests that it may be the underlying etiology for the patient's progressive foot drop.   Recommendations: 1. Will need MRI of brain, cervical and thoracic spine with and without contrast to rule out drop metastases. Per Radiology, will need to wait 24 hours after initial L-spine study to wash out contrast before the above MRI exams can be performed.  2. Lumbar puncture for cell count with differential, protein, glucose, cytology, EBV PCR, VDRL.  3. Neurosurgery consult to evaluate the mass  lesion seen on L-spine MRI, for possible biopsy.  4. If the above are unrevealing, will need outpatient Neurology follow up within 1-2 weeks for EMG/NCS to assess for possible polyradiculopathy versus axonal or demyelinating motor neuropathy, as well as to assess the extent of sensory involvement.  5. Physical therapy.  6. CK level.  Electronically signed: Dr. Kerney Elbe 03/16/2017, 4:05 AM

## 2017-03-16 NOTE — H&P (Addendum)
[facility] History and Physical  Kyle Novak WCH:[id number] DOB: 1966-01-01 DOA: 03/15/2017  Referring physician:  PCP: Care, Jinny Blossom [facility]   Chief Complaint: "It's like my legs are sleep."  HPI: Kyle Novak is a 52 y.o. male history significant for diabetes and hypertension presents the emergency room with upper extremity weakness.  Patient states the feeling is hard to lift his feet off the ground.  Has to step high in order to walk.  This is been going on for several weeks.  Continues to worsen.  No past episodes.  Denies any trauma.  Patient have not seen his primary care doctor for this who referred him to neurology.  ED course: MRI shows lesion of the spine.  EDP consulted neurology and neurosurgery.  Admission to [facility] advised for possible cancer treatment.  Hospitalist to admit patient.   Review of Systems:  As per HPI otherwise 10 point review of systems negative.    Past Medical History:  Diagnosis Date  . Diabetes mellitus without complication ([place])   . Hypertension    Past Surgical History:  Procedure Laterality Date  . NO PAST SURGERIES     Social History:  reports that he has been smoking cigarettes.  he has never used smokeless tobacco. He reports that he drinks alcohol. He reports that he does not use drugs.  No Known Allergies  Family History  Problem Relation Age of Onset  . Lung cancer Father      Prior to Admission medications   Medication Sig Start Date End Date Taking? Authorizing Provider  losartan-hydrochlorothiazide (HYZAAR) 100-12.5 MG tablet Take 1 tablet by mouth daily. 01/22/17  Yes [provider]  Multiple Vitamin (MULITIVITAMIN WITH MINERALS) TABS Take 1 tablet by mouth daily.   Yes [provider]  TRESIBA FLEXTOUCH 200 UNIT/ML SOPN Inject 1 each into the skin daily. 01/22/17  Yes [provider]  lidocaine (LIDODERM) 5 % Place 1 patch onto the skin daily. Remove & Discard patch  within 12 hours or as directed by MD Patient not taking: Reported on 03/15/2017 02/04/16   Joy, Helane Gunther, PA-C  methocarbamol (ROBAXIN) 500 MG tablet Take 1 tablet (500 mg total) by mouth 2 (two) times daily. Patient not taking: Reported on 03/15/2017 02/04/16   Joy, Raquel Sarna C, PA-C  naproxen (NAPROSYN) 500 MG tablet Take 1 tablet (500 mg total) by mouth 2 (two) times daily. Patient not taking: Reported on 03/15/2017 02/04/16   Layla Maw   Physical Exam: Vitals:   03/15/17 2047 03/15/17 2148 03/15/17 2200 03/16/17 0000  BP: (!) 145/98 (!) 139/99 (!) 142/104 (!) 149/96  Pulse: 82 86 81 74  Resp: 19 19 13 19   Temp: 98.3 F (36.8 C)     TempSrc: Oral     SpO2: 100% 100% 100% 100%    Wt Readings from Last 3 Encounters:  02/04/16 78 kg (172 lb)    General:  Appears calm and comfortable; a&Ox3 Eyes:  PERRL, EOMI, normal lids, iris ENT:  grossly normal hearing, lips & tongue Neck:  no LAD, masses or thyromegaly Cardiovascular:  RRR, no m/r/g. No LE edema.  Respiratory:  CTA bilaterally, no w/r/r. Normal respiratory effort. Abdomen:  soft, ntnd Skin:  no rash or induration seen on limited exam Musculoskeletal:  grossly normal tone BUE/BLE Psychiatric:  grossly normal mood and affect, speech fluent and appropriate Neurologic:  CN 2-12 grossly intact, moves all extremities in coordinated fashion; except bil foot drop dorsiflexion weakness  in feet, decr sensation on dorsum of feet          Labs on Admission:  Basic Metabolic Panel: Recent Labs  Lab 03/15/17 1954  NA 139  K 3.8  CL 106  CO2 27  GLUCOSE 208*  BUN 13  CREATININE 0.67  CALCIUM 9.5  MG 1.9   Liver Function Tests: No results for input(s): AST, ALT, ALKPHOS, BILITOT, PROT, ALBUMIN in the last 168 hours. No results for input(s): LIPASE, AMYLASE in the last 168 hours. No results for input(s): AMMONIA in the last 168 hours. CBC: Recent Labs  Lab 03/15/17 1954  WBC 9.5  NEUTROABS 5.3  HGB 14.6  HCT 43.4    MCV 87.7  PLT 327   Cardiac Enzymes: Recent Labs  Lab 03/15/17 1954  CKTOTAL 77    BNP (last 3 results) No results for input(s): BNP in the last 8760 hours.  ProBNP (last 3 results) No results for input(s): PROBNP in the last 8760 hours.   Creatinine clearance cannot be calculated (Unknown ideal weight.)  CBG: No results for input(s): GLUCAP in the last 168 hours.  Radiological Exams on Admission: Mr Lumbar Spine W Wo Contrast  Result Date: 03/15/2017 CLINICAL DATA:  Back pain for 1 month. Multiple falls. Tingling in both legs. EXAM: MRI LUMBAR SPINE WITHOUT AND WITH CONTRAST TECHNIQUE: Multiplanar and multiecho pulse sequences of the lumbar spine were obtained without and with intravenous contrast. CONTRAST:  14mL MULTIHANCE GADOBENATE DIMEGLUMINE 529 MG/ML IV SOLN COMPARISON:  None. FINDINGS: Segmentation:  Standard. Alignment:  Anatomic. Vertebrae:  No worrisome osseous lesion. Conus medullaris and cauda equina: Conus extends to the L1 level. Normal signal and morphology to the conus. At the L2-3 level, there is an enhancing 5 mm spherical lesion, midline location intradural, which is adjacent to the filum terminale and adjacent to multiple cauda equina nerve roots. The lesion demonstrates intrinsic T1 and T2 hypointensity, with a small area of suspected central necrosis. No other similar lesions are seen. Paraspinal and other soft tissues: Unremarkable. Disc levels: L1-L2:  Normal. L2-L3:  Normal. L3-L4:  Normal disc space.  Mild facet arthropathy.  No impingement. L4-L5:  Normal disc space.  Mild facet arthropathy.  No impingement. L5-S1: Central protrusion. Mild facet arthropathy. No impingement. IMPRESSION: Enhancing intradural midline 5 mm spherical lesion, adjacent to the filum terminale and multiple cauda equina nerve roots. 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 Short-term follow-up 3-6  months recommended with repeat lumbar MRI without and with contrast. In addition, elective, not urgent, neurosurgical consultation may be warranted. L5-S1 central protrusion.  No impingement. Electronically Signed   By: Staci Righter M.D.   On: 03/15/2017 20:47    EKG: pending  Assessment/Plan Principal Problem:   Spinal cord tumor Active Problems:   Diabetes mellitus without complication ([facility])   Hypertension  Spinal lesion MRIs tomorrow  EDP consulted neuro and neurosurg who will see pt in AM Pt kept here for possible CA tx  Hypertension When necessary hydralazine 10 mg IV as needed for severe blood pressure  DM SSI AC  ETOH abuse Multiple cans of beer and shots, 4 days a week CIWA protocol  Code Status: FC  DVT Prophylaxis: heparin Family Communication: none available, pt's GF is decision maker Disposition Plan: Pending Improvement  Status: medsurg inpt  Elwin Mocha, MD Family Medicine [facility] [email or web address] Password [email or web address]

## 2017-03-16 NOTE — ED Notes (Signed)
Gave report to Clarise Cruz, Therapist, sports.

## 2017-03-16 NOTE — Progress Notes (Signed)
Patient ID: Kyle Novak, male   DOB: 1965-09-17, 52 y.o.   MRN: [id number] I have spoken with radiology, and will proceed with an MRI with out contrast, to rule out a mass lesion in the cervical and or thoracic lesions. Contrast is not necessary. Orders have been submitted.

## 2017-03-16 NOTE — Progress Notes (Addendum)
PROGRESS NOTE  Kyle Novak [id number]:[id number] DOB: 29-Dec-1965 DOA: 03/15/2017 PCP: Care, Jinny Blossom [facility]  HPI/Recap of past 24 hours:  Denies pain, no fever No bowel or bladder issues Able to ambulate with assistant  Assessment/Plan: Principal Problem:   Spinal cord tumor Active Problems:   Diabetes mellitus without complication ([place])   Hypertension   Spinal cord mass ([place])  Weakness with Spinal lesion on mri neurology and neurosurg consulted More mri planned, LP by IR on 1/30 Pt kept here for possible CA tx  Hypertension On losartan-hctz at home He is started on lower dose losartan here, hold hctz for now. Continue titrate bp meds.   Insulin dependent DM2 On tresiba at home Check a1c, on ssi here   ETOH abuse Multiple cans of beer and shots, 4 days a week CIWA protocol  Current everyday smoker: nicotine patch provided.  Code Status: Full DVT Prophylaxis: scd for now , anticipating lumbar puncture   Disposition Plan: not ready to discharge   Consultants:  Neurology  Neurosurgery  IR  Procedures:  Imaging guided lumbar puncture on 1/30  Antibiotics:  none   Objective: BP (!) 140/103 (BP Location: Left Arm)   Pulse 79   Temp 98.3 F (36.8 C) (Oral)   Resp 20   Ht 5\' 8"  (1.727 m)   Wt 73.4 kg (161 lb 13.1 oz)   SpO2 100%   BMI 24.60 kg/m  No intake or output data in the 24 hours ending 03/16/17 0818 Filed Weights   03/16/17 0313  Weight: 73.4 kg (161 lb 13.1 oz)    Exam: Patient is examined daily including today on 03/16/2017, exams remain the same as of yesterday except that has changed    General:  NAD  Cardiovascular: RRR  Respiratory: CTABL  Abdomen: Soft/ND/NT, positive BS  Musculoskeletal: No Edema  Neuro: alert, oriented , bilateral foot drop, ataxia  Data Reviewed: Basic Metabolic Panel: Recent Labs  Lab 03/15/17 1954  NA 139  K 3.8  CL 106  CO2 27  GLUCOSE 208*  BUN 13  CREATININE 0.67    CALCIUM 9.5  MG 1.9   Liver Function Tests: No results for input(s): AST, ALT, ALKPHOS, BILITOT, PROT, ALBUMIN in the last 168 hours. No results for input(s): LIPASE, AMYLASE in the last 168 hours. No results for input(s): AMMONIA in the last 168 hours. CBC: Recent Labs  Lab 03/15/17 1954  WBC 9.5  NEUTROABS 5.3  HGB 14.6  HCT 43.4  MCV 87.7  PLT 327   Cardiac Enzymes:   Recent Labs  Lab 03/15/17 1954  CKTOTAL 77   BNP (last 3 results) No results for input(s): BNP in the last 8760 hours.  ProBNP (last 3 results) No results for input(s): PROBNP in the last 8760 hours.  CBG: Recent Labs  Lab 03/16/17 0300 03/16/17 0520 03/16/17 0747  GLUCAP 251* 92 128*    No results found for this or any previous visit (from the past 240 hour(s)).   Studies: Mr Lumbar Spine W Wo Contrast  Result Date: 03/15/2017 CLINICAL DATA:  Back pain for 1 month. Multiple falls. Tingling in both legs. EXAM: MRI LUMBAR SPINE WITHOUT AND WITH CONTRAST TECHNIQUE: Multiplanar and multiecho pulse sequences of the lumbar spine were obtained without and with intravenous contrast. CONTRAST:  83mL MULTIHANCE GADOBENATE DIMEGLUMINE 529 MG/ML IV SOLN COMPARISON:  None. FINDINGS: Segmentation:  Standard. Alignment:  Anatomic. Vertebrae:  No worrisome osseous lesion. Conus medullaris and cauda equina: Conus extends to the L1  level. Normal signal and morphology to the conus. At the L2-3 level, there is an enhancing 5 mm spherical lesion, midline location intradural, which is adjacent to the filum terminale and adjacent to multiple cauda equina nerve roots. The lesion demonstrates intrinsic T1 and T2 hypointensity, with a small area of suspected central necrosis. No other similar lesions are seen. Paraspinal and other soft tissues: Unremarkable. Disc levels: L1-L2:  Normal. L2-L3:  Normal. L3-L4:  Normal disc space.  Mild facet arthropathy.  No impingement. L4-L5:  Normal disc space.  Mild facet arthropathy.  No  impingement. L5-S1: Central protrusion. Mild facet arthropathy. No impingement. IMPRESSION: Enhancing intradural midline 5 mm spherical lesion, adjacent to the filum terminale and multiple cauda equina nerve roots. 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 Short-term follow-up 3-6 months recommended with repeat lumbar MRI without and with contrast. In addition, elective, not urgent, neurosurgical consultation may be warranted. L5-S1 central protrusion.  No impingement. Electronically Signed   By: Staci Righter M.D.   On: 03/15/2017 20:47    Scheduled Meds: . folic acid  1 mg Oral Daily  . insulin aspart  0-15 Units Subcutaneous Q4H  . multivitamin with minerals  1 tablet Oral Daily  . thiamine  100 mg Oral Daily   Or  . thiamine  100 mg Intravenous Daily    Continuous Infusions:   Time spent: 38mins, case discussed with neurology Dr Aroor over the phone. I have personally reviewed and interpreted on  03/16/2017 daily labs, tele strips, imagings as discussed above under date review session and assessment and plans.   I reviewed all nursing notes, consultant notes,  vitals, pertinent old records  I have discussed plan of care as described above with RN , patient  on 03/16/2017   Florencia Reasons MD, PhD  [facility] Pager [phone]. If 7PM-7AM, please contact night-coverage at [email or web address], password [facility] 03/16/2017, 8:18 AM  LOS: 0 days

## 2017-03-17 ENCOUNTER — Other Ambulatory Visit: Payer: Self-pay | Admitting: Diagnostic Radiology

## 2017-03-17 ENCOUNTER — Inpatient Hospital Stay (HOSPITAL_COMMUNITY): Payer: BLUE CROSS/BLUE SHIELD

## 2017-03-17 LAB — CBC WITH DIFFERENTIAL/PLATELET
BASOS ABS: 0 10*3/uL (ref 0.0–0.1)
BASOS PCT: 0 %
EOS ABS: 0.1 10*3/uL (ref 0.0–0.7)
Eosinophils Relative: 1 %
HEMATOCRIT: 42.5 % (ref 39.0–52.0)
HEMOGLOBIN: 14.5 g/dL (ref 13.0–17.0)
Lymphocytes Relative: 29 %
Lymphs Abs: 3.1 10*3/uL (ref 0.7–4.0)
MCH: 29.7 pg (ref 26.0–34.0)
MCHC: 34.1 g/dL (ref 30.0–36.0)
MCV: 86.9 fL (ref 78.0–100.0)
MONOS PCT: 9 %
Monocytes Absolute: 0.9 10*3/uL (ref 0.1–1.0)
NEUTROS ABS: 6.6 10*3/uL (ref 1.7–7.7)
NEUTROS PCT: 61 %
Platelets: 356 10*3/uL (ref 150–400)
RBC: 4.89 MIL/uL (ref 4.22–5.81)
RDW: 12 % (ref 11.5–15.5)
WBC: 10.7 10*3/uL — ABNORMAL HIGH (ref 4.0–10.5)

## 2017-03-17 LAB — CSF CELL COUNT WITH DIFFERENTIAL
RBC Count, CSF: 0 /mm3
RBC Count, CSF: 1 /mm3 — ABNORMAL HIGH
TUBE #: 4
Tube #: 1
WBC, CSF: 1 /mm3 (ref 0–5)
WBC, CSF: 1 /mm3 (ref 0–5)

## 2017-03-17 LAB — GLUCOSE, CSF: GLUCOSE CSF: 84 mg/dL — AB (ref 40–70)

## 2017-03-17 LAB — GLUCOSE, CAPILLARY
GLUCOSE-CAPILLARY: 163 mg/dL — AB (ref 65–99)
GLUCOSE-CAPILLARY: 166 mg/dL — AB (ref 65–99)
GLUCOSE-CAPILLARY: 169 mg/dL — AB (ref 65–99)
GLUCOSE-CAPILLARY: 320 mg/dL — AB (ref 65–99)
Glucose-Capillary: 124 mg/dL — ABNORMAL HIGH (ref 65–99)
Glucose-Capillary: 137 mg/dL — ABNORMAL HIGH (ref 65–99)

## 2017-03-17 LAB — COMPREHENSIVE METABOLIC PANEL
ALK PHOS: 95 U/L (ref 38–126)
ALT: 24 U/L (ref 17–63)
ANION GAP: 8 (ref 5–15)
AST: 21 U/L (ref 15–41)
Albumin: 4.3 g/dL (ref 3.5–5.0)
BUN: 14 mg/dL (ref 6–20)
CO2: 24 mmol/L (ref 22–32)
Calcium: 9.4 mg/dL (ref 8.9–10.3)
Chloride: 106 mmol/L (ref 101–111)
Creatinine, Ser: 0.8 mg/dL (ref 0.61–1.24)
GFR calc Af Amer: >60 mL/min (ref >60)
GFR calc non Af Amer: >60 mL/min (ref >60)
GLUCOSE: 174 mg/dL — AB (ref 65–99)
POTASSIUM: 3.7 mmol/L (ref 3.5–5.1)
SODIUM: 138 mmol/L (ref 135–145)
Total Bilirubin: 0.8 mg/dL (ref 0.3–1.2)
Total Protein: 7.5 g/dL (ref 6.5–8.1)

## 2017-03-17 LAB — MAGNESIUM: Magnesium: 1.9 mg/dL (ref 1.7–2.4)

## 2017-03-17 LAB — PROTEIN, CSF: TOTAL PROTEIN, CSF: 41 mg/dL (ref 15–45)

## 2017-03-17 LAB — RPR: RPR Ser Ql: NONREACTIVE

## 2017-03-17 LAB — HIV ANTIBODY (ROUTINE TESTING W REFLEX): HIV SCREEN 4TH GENERATION: NONREACTIVE

## 2017-03-17 MED ORDER — HYDROCHLOROTHIAZIDE 12.5 MG PO CAPS
12.5000 mg | ORAL_CAPSULE | Freq: Every day | ORAL | Status: DC
  Administered 2017-03-18 – 2017-03-19 (×2): 12.5 mg via ORAL
  Filled 2017-03-17 (×2): qty 1

## 2017-03-17 MED ORDER — LOSARTAN POTASSIUM-HCTZ 100-12.5 MG PO TABS: 1.0000 | ORAL_TABLET | Freq: Every day | ORAL | Status: DC

## 2017-03-17 MED ORDER — LIDOCAINE HCL 1 % IJ SOLN
INTRAMUSCULAR | Status: AC
  Filled 2017-03-17: qty 20

## 2017-03-17 MED ORDER — ACETAMINOPHEN 325 MG PO TABS: 650.0000 mg | ORAL_TABLET | ORAL | Status: AC | PRN

## 2017-03-17 MED ORDER — INSULIN ASPART 100 UNIT/ML ~~LOC~~ SOLN
0.0000 [IU] | Freq: Three times a day (TID) | SUBCUTANEOUS | Status: DC
  Administered 2017-03-18: 5 [IU] via SUBCUTANEOUS
  Administered 2017-03-18 (×2): 3 [IU] via SUBCUTANEOUS
  Administered 2017-03-19: 13:00:00 5 [IU] via SUBCUTANEOUS
  Administered 2017-03-19: 3 [IU] via SUBCUTANEOUS

## 2017-03-17 MED ORDER — OXYCODONE HCL 5 MG PO TABS
10.0000 mg | ORAL_TABLET | Freq: Four times a day (QID) | ORAL | Status: DC | PRN
  Administered 2017-03-18 – 2017-03-19 (×4): 10 mg via ORAL
  Filled 2017-03-17 (×4): qty 2

## 2017-03-17 MED ORDER — LOSARTAN POTASSIUM 50 MG PO TABS
100.0000 mg | ORAL_TABLET | Freq: Every day | ORAL | Status: DC
  Administered 2017-03-18 – 2017-03-19 (×2): 100 mg via ORAL
  Filled 2017-03-17 (×2): qty 2

## 2017-03-17 MED ORDER — OXYCODONE HCL 5 MG PO TABS
10.0000 mg | ORAL_TABLET | Freq: Once | ORAL | Status: AC
  Administered 2017-03-17: 10 mg via ORAL
  Filled 2017-03-17: qty 2

## 2017-03-17 NOTE — Evaluation (Signed)
Physical Therapy Evaluation Patient Details Name: Kyle Novak MRN: [id number] DOB: 1965/12/12 Today's Date: 03/17/2017   History of Present Illness  Kyle Novak is an 52 y.o. male who presented to the [facility] ED on Monday with a chief complaint of bilateral foot drop, left worse than right.  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  Also noted is an L5-S1 central protrusion without nerve root impingement.  Clinical Impression  Patient presents with decreased independence with mobility due to LE weakness and decreased sensation and high fall risk.  He will benefit from skilled PT in the acute setting to address deficits and improve safety and independence prior to d/c home with intermittent assist and follow up outpatient PT.    Follow Up Recommendations Outpatient PT(neurorehabiliation)    Equipment Recommendations  Rolling walker with 5" wheels    Recommendations for Other Services       Precautions / Restrictions Precautions Precautions: Fall Precaution Comments: reports tripping over his feet at times      Mobility  Bed Mobility Overal bed mobility: Needs Assistance Bed Mobility: Rolling;Sidelying to Sit;Sit to Sidelying Rolling: Supervision Sidelying to sit: Supervision     Sit to sidelying: Supervision General bed mobility comments: cues for technique and education on spinal precautions  Transfers Overall transfer level: Needs assistance Equipment used: None;Rolling walker (2 wheeled) Transfers: Sit to/from Stand Sit to Stand: Supervision         General transfer comment: up without device initially with S for safety  Ambulation/Gait Ambulation/Gait assistance: Supervision;Min guard Ambulation Distance (Feet): 150 Feet Assistive device: Rolling walker (2 wheeled);None Gait Pattern/deviations: Decreased stride length;Steppage;Trunk  flexed     General Gait Details: minguard for safety/balance initially without device, used walker for most of session with S level for safety/cues for posture  Stairs Stairs: Yes Stairs assistance: Supervision Stair Management: Alternating pattern;Step to pattern;Forwards;One rail Right Number of Stairs: 10 General stair comments: cues to use both hands on one rail, but chose to ascend/descend forwards despite demonstrating sidestepping; self selected sequence step through to ascend, step to to descend  Wheelchair Mobility    Modified Rankin (Stroke Patients Only)       Balance Overall balance assessment: Needs assistance   Sitting balance-Leahy Scale: Good       Standing balance-Leahy Scale: Good Standing balance comment: static balance good, dynamic balance NT except for gait, needs UE support for safety                             Pertinent Vitals/Pain Pain Assessment: 0-10 Pain Score: 5  Pain Location: bilateral LE's at knee and down Pain Descriptors / Indicators: Stabbing;Sharp;Shooting;Burning Pain Intervention(s): Monitored during session;Repositioned;Premedicated before session(reports had medication)    Home Living Family/patient expects to be discharged to:: Private residence Living Arrangements: Spouse/significant other Available Help at Discharge: Available PRN/intermittently Type of Home: Apartment Home Access: Stairs to enter Entrance Stairs-Rails: Right Entrance Stairs-Number of Steps: 15 Home Layout: One level Home Equipment: None      Prior Function Level of Independence: Independent         Comments: works as Landscape architect        Extremity/Trunk Assessment   Upper Extremity Assessment Upper Extremity Assessment: Overall WFL for tasks assessed    Lower Extremity Assessment Lower Extremity Assessment: RLE deficits/detail;LLE  deficits/detail RLE Deficits / Details: AROM WFL, except ankle DF AAROM  WFL, strength hip flexion 4/5, knee extension 4/5 (w/pain) ankle DF 2/5; sensation decreased to light touch thighs and below RLE Sensation: decreased light touch LLE Deficits / Details: AROM WFL, except ankle DF AAROM WFL, strength hip flexion 4/5, knee extension 4/5 (w/pain) ankle DF 2/5; sensation decreased to light touch thighs and below LLE Sensation: decreased light touch    Cervical / Trunk Assessment Cervical / Trunk Assessment: Normal  Communication   Communication: No difficulties  Cognition Arousal/Alertness: Awake/alert Behavior During Therapy: WFL for tasks assessed/performed Overall Cognitive Status: Within Functional Limits for tasks assessed                                        General Comments General comments (skin integrity, edema, etc.): Discussed possibility of AFO's, but plan to wait on tx to see if improves sensation or strength     Exercises Other Exercises Other Exercises: self stretch with sheet for hamstrings x 1 each leg 20 sec hold cues for technique Other Exercises: self stretch with sheet for heel cords x 1 each leg 20 sec hold cues for technique   Assessment/Plan    PT Assessment Patient needs continued PT services  PT Problem List Decreased strength;Decreased mobility;Decreased safety awareness;Decreased balance;Decreased knowledge of use of DME;Decreased range of motion       PT Treatment Interventions DME instruction;Functional mobility training;Balance training;Patient/family education;Gait training;Therapeutic activities;Neuromuscular re-education;Stair training;Therapeutic exercise    PT Goals (Current goals can be found in the Care Plan section)  Acute Rehab PT Goals Patient Stated Goal: To go home PT Goal Formulation: With patient Time For Goal Achievement: 03/24/17 Potential to Achieve Goals: Good    Frequency Min 3X/week   Barriers to discharge        Co-evaluation               AM-PAC PT "6 Clicks"  Daily Activity  Outcome Measure Difficulty turning over in bed (including adjusting bedclothes, sheets and blankets)?: None Difficulty moving from lying on back to sitting on the side of the bed? : A Little Difficulty sitting down on and standing up from a chair with arms (e.g., wheelchair, bedside commode, etc,.)?: A Little Help needed moving to and from a bed to chair (including a wheelchair)?: A Little Help needed walking in hospital room?: A Little Help needed climbing 3-5 steps with a railing? : A Little 6 Click Score: 19    End of Session Equipment Utilized During Treatment: Gait belt Activity Tolerance: Patient tolerated treatment well Patient left: in bed;with bed alarm set   PT Visit Diagnosis: Other abnormalities of gait and mobility (R26.89);Muscle weakness (generalized) (M62.81);Other symptoms and signs involving the nervous system (R29.898)    Time: 2694-8546 PT Time Calculation (min) (ACUTE ONLY): 23 min   Charges:   PT Evaluation $PT Eval Moderate Complexity: 1 Mod PT Treatments $Gait Training: 8-22 mins   PT G CodesMagda Kiel, [place] [phone] 03/17/2017   Reginia Naas 03/17/2017, 1:12 PM

## 2017-03-17 NOTE — Progress Notes (Signed)
PROGRESS NOTE    Kyle Novak  WUJ:[id number] DOB: 1965-10-14 DOA: 03/15/2017 PCP: [facility]    Brief Narrative:  52 y.o. male history significant for diabetes and hypertension presents the emergency room with upper extremity weakness.  Patient states the feeling is hard to lift his feet off the ground.  Has to step high in order to walk.  This is been going on for several weeks.  Continues to worsen.  No past episodes.  Denies any trauma.  Patient have not seen his primary care doctor for this who referred him to neurology.  Assessment & Plan:   Principal Problem:   Spinal cord tumor Active Problems:   Diabetes mellitus without complication ([place])   Hypertension   Spinal cord mass ([place])  Weakness with Spinal lesion on mri Neurology and neurosurg consulted and following Patient is s/p LP, results pending Cervical and thoracic MRI reviewed, no other lesions noted Neurosurgery following  Hypertension On losartan-hctz at home, continued Stable  Insulin dependent DM2 On tresiba at home Continue SSI coverage  ETOH abuse Multiple cans of beer and shots, 4 days a week Continue with CIWA protocol  Current everyday smoker: nicotine patch provided.  DVT prophylaxis: SCD's Code Status: Full Family Communication: Pt in room, family not at bedside Disposition Plan: Uncertain at this time  Consultants:   Neurosurgery  Neurology  Procedures:   LP 1/30  Antimicrobials: Anti-infectives (From admission, onward)   None       Subjective: Without complaints. Reports feeling better  Objective: Vitals:   03/16/17 1910 03/16/17 2137 03/17/17 0405 03/17/17 1342  BP: 140/85 (!) 148/90 133/87 (!) 144/96  Pulse: 77 86 (!) 103 81  Resp: 19 20 18 18   Temp: 98.5 F (36.9 C) 99 F (37.2 C) 98.6 F (37 C) 98.6 F (37 C)  TempSrc: Oral Oral Oral Oral  SpO2: 100% 100% 100% 100%  Weight:      Height:        Intake/Output Summary (Last 24  hours) at 03/17/2017 1543 Last data filed at 03/16/2017 1815 Gross per 24 hour  Intake 222 ml  Output -  Net 222 ml   Filed Weights   03/16/17 0313  Weight: 73.4 kg (161 lb 13.1 oz)    Examination:  General exam: Appears calm and comfortable  Respiratory system: Clear to auscultation. Respiratory effort normal. Cardiovascular system: S1 & S2 heard, RRR. Gastrointestinal system: Abdomen is nondistended, soft and nontender. No organomegaly or masses felt. Normal bowel sounds heard. Central nervous system: Alert and oriented. No tremors, seizures Extremities: Symmetric 5 x 5 power. Skin: No rashes, lesions Psychiatry: Judgement and insight appear normal. Mood & affect appropriate.   Data Reviewed: I have personally reviewed following labs and imaging studies  CBC: Recent Labs  Lab 03/15/17 1954 03/17/17 0454  WBC 9.5 10.7*  NEUTROABS 5.3 6.6  HGB 14.6 14.5  HCT 43.4 42.5  MCV 87.7 86.9  PLT 327 956   Basic Metabolic Panel: Recent Labs  Lab 03/15/17 1954 03/17/17 0454  NA 139 138  K 3.8 3.7  CL 106 106  CO2 27 24  GLUCOSE 208* 174*  BUN 13 14  CREATININE 0.67 0.80  CALCIUM 9.5 9.4  MG 1.9 1.9   GFR: Estimated Creatinine Clearance: 105.7 mL/min (by C-G formula based on SCr of 0.8 mg/dL). Liver Function Tests: Recent Labs  Lab 03/17/17 0454  AST 21  ALT 24  ALKPHOS 95  BILITOT 0.8  PROT 7.5  ALBUMIN  4.3   No results for input(s): LIPASE, AMYLASE in the last 168 hours. No results for input(s): AMMONIA in the last 168 hours. Coagulation Profile: No results for input(s): INR, PROTIME in the last 168 hours. Cardiac Enzymes: Recent Labs  Lab 03/15/17 1954  CKTOTAL 77   BNP (last 3 results) No results for input(s): PROBNP in the last 8760 hours. HbA1C: No results for input(s): HGBA1C in the last 72 hours. CBG: Recent Labs  Lab 03/16/17 2108 03/17/17 0002 03/17/17 0402 03/17/17 0728 03/17/17 1318  GLUCAP 132* 120* 163* 166* 124*   Lipid  Profile: No results for input(s): CHOL, HDL, LDLCALC, TRIG, CHOLHDL, LDLDIRECT in the last 72 hours. Thyroid Function Tests: No results for input(s): TSH, T4TOTAL, FREET4, T3FREE, THYROIDAB in the last 72 hours. Anemia Panel: Recent Labs    03/15/17 1954  VITAMINB12 1,224*   Sepsis Labs: No results for input(s): PROCALCITON, LATICACIDVEN in the last 168 hours.  No results found for this or any previous visit (from the past 240 hour(s)).   Radiology Studies: Mr Jeri Cos And Wo Contrast  Result Date: 03/16/2017 CLINICAL DATA:  Initial evaluation for possible malignancy/metastatic disease. Patient with lumbar spine lesion. EXAM: MRI HEAD WITHOUT AND WITH CONTRAST TECHNIQUE: Multiplanar, multiecho pulse sequences of the brain and surrounding structures were obtained without and with intravenous contrast. CONTRAST:  20mL MULTIHANCE GADOBENATE DIMEGLUMINE 529 MG/ML IV SOLN COMPARISON:  Comparison made with prior MRI of the lumbar spine from 03/15/2017. FINDINGS: Brain: Cerebral volume is normal for age. No significant cerebral white matter changes identified. No abnormal foci of restricted diffusion to suggest acute or subacute ischemia. Gray-white matter differentiation well maintained. No encephalomalacia to suggest chronic infarction. No foci of susceptibility artifact to suggest acute or chronic intracranial hemorrhage. No mass lesion, midline shift or mass effect. Ventricles normal size without hydrocephalus. No extra-axial fluid collection. Major dural sinuses are patent. No abnormal enhancement. No findings to suggest metastatic disease. Pituitary gland suprasellar region normal. Midline structures intact and normal. Vascular: Major intracranial vascular flow voids are well maintained. Skull and upper cervical spine: Craniocervical junction normal. Visualized upper cervical spine within normal limits. Bone marrow signal intensity normal. No scalp soft tissue abnormality. Sinuses/Orbits: Globes and  orbital soft tissues within normal limits. Small maxillary sinus retention cyst noted bilaterally. No mastoid effusion. Inner ear structures normal. Other: None. IMPRESSION: Normal MRI of the brain. No intracranial mass lesion or evidence for metastatic disease. Electronically Signed   By: Jeannine Boga M.D.   On: 03/16/2017 21:50   Mr Cervical Spine Wo Contrast  Result Date: 03/16/2017 CLINICAL DATA:  52 year old male with lower extremity weakness, bilateral foot drop not explained on lumbar MRI yesterday without and with contrast. EXAM: MRI CERVICAL AND THORACIC SPINE WITHOUT CONTRAST TECHNIQUE: Multiplanar and multiecho pulse sequences of the cervical and thoracic spine, were obtained without intravenous contrast. COMPARISON:  Lumbar MRI without and with contrast 03/15/2017. FINDINGS: MRI CERVICAL SPINE FINDINGS Alignment: Straightening and mild reversal of cervical lordosis. Vertebrae: Visualized bone marrow signal is within normal limits. No marrow edema or evidence of acute osseous abnormality. Cord: Spinal cord signal is within normal limits at all visualized levels. Posterior Fossa, vertebral arteries, paraspinal tissues: Cervicomedullary junction is within normal limits. Negative visualized brain parenchyma. Small mucous retention cyst in the right maxillary sinus. Preserved major vascular flow voids in the neck. Negative neck soft tissues. Disc levels: C2-C3:  Negative. C3-C4: Disc desiccation with mild circumferential disc bulging. Mildly lobulated posterior and right foraminal components of  disc with endplate spurring. No significant spinal stat no spinal stenosis. Mild right C4 neural foraminal stenosis. C4-C5: Minimal disc bulge and facet hypertrophy. Borderline to mild bilateral C5 foraminal stenosis. C5-C6: Mild circumferential disc bulge. Mild endplate spurring. Borderline to mild bilateral C6 foraminal stenosis. C6-C7: Mild endplate spurring and right facet hypertrophy. No stenosis.  C7-T1: Mild to moderate facet hypertrophy. Mild left C8 neural foraminal stenosis. MRI THORACIC SPINE FINDINGS Thoracic spine segmentation: Appears normal and is concordant with the lumbar spine numbering yesterday. Alignment:  Normal thoracic kyphosis. Vertebrae: Subtle T8 superior endplate deformity appears to be chronic or congenital. Normal vertebral height elsewhere. Normal bone marrow signal. No marrow edema or evidence of acute osseous abnormality. Cord: Spinal cord signal is within normal limits at all visualized levels. Normal conus medullaris at T12-L1. Paraspinal and other soft tissues: Normal visible thoracic and upper abdominal viscera. Negative visualized posterior paraspinal soft tissues. Disc levels: T1-T2: Negative. T2-T3: Mild to moderate facet hypertrophy. Moderate left and mild right T2 neural foraminal stenosis. T3-T4: Mild facet hypertrophy.  No stenosis. T4-T5: Mild facet hypertrophy.  No stenosis. T5-T6: Negative. T6-T7: Negative. T7-T8: Negative. T8-T9: Mild disc desiccation and circumferential disc bulge. Superimposed small central disc protrusion (series 14, image 26). Narrowing of the ventral CSF space but no spinal stenosis. Mild facet hypertrophy. No foraminal stenosis. T9-T10: Mild disc desiccation and circumferential disc bulge. Small right paracentral disc protrusion with annular fissure (series 14, image 29). Narrowing of the ventral CSF space but no spinal stenosis. Mild facet hypertrophy. Borderline to mild left T9 foraminal stenosis. T10-T11: Mild facet hypertrophy. Borderline to mild bilateral T10 foraminal stenosis. T11-T12: Mild facet hypertrophy. Mild left and borderline to mild right T11 foraminal stenosis. T12-L1: Negative. IMPRESSION: 1. Intermittent cervical spine disc and endplate degeneration with no spinal stenosis. Normal cervical spinal cord. 2. Thoracic disc degeneration at T8-T9 and T9-T10. Small thoracic disc herniations at both levels not resulting in thoracic  spinal stenosis. Normal thoracic spinal cord otherwise. 3. Intermittent mild cervical and thoracic neural foraminal stenosis, aside from moderate left thoracic neural foraminal stenosis at the T2 nerve level. 4. Note that this study was done without IV contrast, as Brain MRI without and with contrast is still pending at this time (scheduled for later today), and decision was made to follow-up with postcontrast cervical and/or spine imaging as necessary. But no cervical or thoracic spine lesion is identified for which IV contrast would be valuable. Electronically Signed   By: Genevie Ann M.D.   On: 03/16/2017 12:50   Mr Thoracic Spine Wo Contrast  Result Date: 03/16/2017 CLINICAL DATA:  52 year old male with lower extremity weakness, bilateral foot drop not explained on lumbar MRI yesterday without and with contrast. EXAM: MRI CERVICAL AND THORACIC SPINE WITHOUT CONTRAST TECHNIQUE: Multiplanar and multiecho pulse sequences of the cervical and thoracic spine, were obtained without intravenous contrast. COMPARISON:  Lumbar MRI without and with contrast 03/15/2017. FINDINGS: MRI CERVICAL SPINE FINDINGS Alignment: Straightening and mild reversal of cervical lordosis. Vertebrae: Visualized bone marrow signal is within normal limits. No marrow edema or evidence of acute osseous abnormality. Cord: Spinal cord signal is within normal limits at all visualized levels. Posterior Fossa, vertebral arteries, paraspinal tissues: Cervicomedullary junction is within normal limits. Negative visualized brain parenchyma. Small mucous retention cyst in the right maxillary sinus. Preserved major vascular flow voids in the neck. Negative neck soft tissues. Disc levels: C2-C3:  Negative. C3-C4: Disc desiccation with mild circumferential disc bulging. Mildly lobulated posterior and right foraminal components of  disc with endplate spurring. No significant spinal stat no spinal stenosis. Mild right C4 neural foraminal stenosis. C4-C5: Minimal  disc bulge and facet hypertrophy. Borderline to mild bilateral C5 foraminal stenosis. C5-C6: Mild circumferential disc bulge. Mild endplate spurring. Borderline to mild bilateral C6 foraminal stenosis. C6-C7: Mild endplate spurring and right facet hypertrophy. No stenosis. C7-T1: Mild to moderate facet hypertrophy. Mild left C8 neural foraminal stenosis. MRI THORACIC SPINE FINDINGS Thoracic spine segmentation: Appears normal and is concordant with the lumbar spine numbering yesterday. Alignment:  Normal thoracic kyphosis. Vertebrae: Subtle T8 superior endplate deformity appears to be chronic or congenital. Normal vertebral height elsewhere. Normal bone marrow signal. No marrow edema or evidence of acute osseous abnormality. Cord: Spinal cord signal is within normal limits at all visualized levels. Normal conus medullaris at T12-L1. Paraspinal and other soft tissues: Normal visible thoracic and upper abdominal viscera. Negative visualized posterior paraspinal soft tissues. Disc levels: T1-T2: Negative. T2-T3: Mild to moderate facet hypertrophy. Moderate left and mild right T2 neural foraminal stenosis. T3-T4: Mild facet hypertrophy.  No stenosis. T4-T5: Mild facet hypertrophy.  No stenosis. T5-T6: Negative. T6-T7: Negative. T7-T8: Negative. T8-T9: Mild disc desiccation and circumferential disc bulge. Superimposed small central disc protrusion (series 14, image 26). Narrowing of the ventral CSF space but no spinal stenosis. Mild facet hypertrophy. No foraminal stenosis. T9-T10: Mild disc desiccation and circumferential disc bulge. Small right paracentral disc protrusion with annular fissure (series 14, image 29). Narrowing of the ventral CSF space but no spinal stenosis. Mild facet hypertrophy. Borderline to mild left T9 foraminal stenosis. T10-T11: Mild facet hypertrophy. Borderline to mild bilateral T10 foraminal stenosis. T11-T12: Mild facet hypertrophy. Mild left and borderline to mild right T11 foraminal  stenosis. T12-L1: Negative. IMPRESSION: 1. Intermittent cervical spine disc and endplate degeneration with no spinal stenosis. Normal cervical spinal cord. 2. Thoracic disc degeneration at T8-T9 and T9-T10. Small thoracic disc herniations at both levels not resulting in thoracic spinal stenosis. Normal thoracic spinal cord otherwise. 3. Intermittent mild cervical and thoracic neural foraminal stenosis, aside from moderate left thoracic neural foraminal stenosis at the T2 nerve level. 4. Note that this study was done without IV contrast, as Brain MRI without and with contrast is still pending at this time (scheduled for later today), and decision was made to follow-up with postcontrast cervical and/or spine imaging as necessary. But no cervical or thoracic spine lesion is identified for which IV contrast would be valuable. Electronically Signed   By: Genevie Ann M.D.   On: 03/16/2017 12:50   Mr Lumbar Spine W Wo Contrast  Result Date: 03/15/2017 CLINICAL DATA:  Back pain for 1 month. Multiple falls. Tingling in both legs. EXAM: MRI LUMBAR SPINE WITHOUT AND WITH CONTRAST TECHNIQUE: Multiplanar and multiecho pulse sequences of the lumbar spine were obtained without and with intravenous contrast. CONTRAST:  47mL MULTIHANCE GADOBENATE DIMEGLUMINE 529 MG/ML IV SOLN COMPARISON:  None. FINDINGS: Segmentation:  Standard. Alignment:  Anatomic. Vertebrae:  No worrisome osseous lesion. Conus medullaris and cauda equina: Conus extends to the L1 level. Normal signal and morphology to the conus. At the L2-3 level, there is an enhancing 5 mm spherical lesion, midline location intradural, which is adjacent to the filum terminale and adjacent to multiple cauda equina nerve roots. The lesion demonstrates intrinsic T1 and T2 hypointensity, with a small area of suspected central necrosis. No other similar lesions are seen. Paraspinal and other soft tissues: Unremarkable. Disc levels: L1-L2:  Normal. L2-L3:  Normal. L3-L4:  Normal disc  space.  Mild facet arthropathy.  No impingement. L4-L5:  Normal disc space.  Mild facet arthropathy.  No impingement. L5-S1: Central protrusion. Mild facet arthropathy. No impingement. IMPRESSION: Enhancing intradural midline 5 mm spherical lesion, adjacent to the filum terminale and multiple cauda equina nerve roots. 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 Short-term follow-up 3-6 months recommended with repeat lumbar MRI without and with contrast. In addition, elective, not urgent, neurosurgical consultation may be warranted. L5-S1 central protrusion.  No impingement. Electronically Signed   By: Staci Righter M.D.   On: 03/15/2017 20:47   Dg Fluoro Guided Needle Plc Aspiration/injection Loc  Result Date: 03/17/2017 CLINICAL DATA:  Back pain and tingling in both legs. Nerve root lesion demonstrated on MRI dated 03/15/2017. EXAM: DIAGNOSTIC LUMBAR PUNCTURE UNDER FLUOROSCOPIC GUIDANCE FLUOROSCOPY TIME:  Fluoroscopy Time:  26 seconds Radiation Exposure Index (if provided by the fluoroscopic device): 10.98 mGy PROCEDURE: Informed consent was obtained from the patient prior to the procedure, including potential complications of headache, allergy, and pain. With the patient prone, the lower back was prepped with Betadine. 1% Lidocaine was used for local anesthesia. Lumbar puncture was performed at the L3-4 level using a 21 gauge needle with return of clear colorless CSF. 25 ml of CSF were obtained for laboratory studies. The patient tolerated the procedure well and there were no apparent complications. IMPRESSION: Lumbar puncture performed at L3-4 without difficulty and without immediate complication. Laboratory reports to follow. Electronically Signed   By: Lorriane Shire M.D.   On: 03/17/2017 15:12    Scheduled Meds: . folic acid  1 mg Oral Daily  . [START ON 03/18/2017] losartan  100 mg Oral Daily   And  . [START ON  03/18/2017] hydrochlorothiazide  12.5 mg Oral Daily  . insulin aspart  0-15 Units Subcutaneous Q4H  . lidocaine      . multivitamin with minerals  1 tablet Oral Daily  . nicotine  14 mg Transdermal Daily  . thiamine  100 mg Oral Daily   Or  . thiamine  100 mg Intravenous Daily   Continuous Infusions:   LOS: 1 day   Marylu Lund, MD [facility] Pager [phone]  If 7PM-7AM, please contact night-coverage [email or web address] Password [facility] 03/17/2017, 3:43 PM

## 2017-03-17 NOTE — Progress Notes (Signed)
Subjective: No significant changes today.  Patient does complain of cramps in his left leg during night.  States he has been working with PT who did recommend an AFO.  Patient had LP today awaiting cytology.  Exam: Vitals:   03/17/17 0405 03/17/17 1342  BP: 133/87 (!) 144/96  Pulse: (!) 103 81  Resp: 18 18  Temp: 98.6 F (37 C) 98.6 F (37 C)  SpO2: 100% 100%    Physical Exam   HEENT-  Normocephalic, no lesions, without obvious abnormality.  Normal external eye and conjunctiva.   Cardiovascular- S1-S2 audible, pulses palpable throughout   Lungs-no rhonchi or wheezing noted, no excessive working breathing.  Saturations within normal limits Abdomen- All 4 quadrants palpated and nontender Extremities- Warm, dry and intact Musculoskeletal-no joint tenderness, deformity or swelling Skin-warm and dry, no hyperpigmentation, vitiligo, or suspicious lesions    Neuro:  Mental Status: Alert, oriented, thought content appropriate.  Speech fluent without evidence of aphasia.  Able to follow 3 step commands without difficulty. Cranial Nerves: II:  Visual fields grossly normal,  III,IV, VI: ptosis not present, extra-ocular motions intact bilaterally pupils equal, round, reactive to light and accommodation V,VII: smile symmetric, facial light touch sensation normal bilaterally VIII: hearing normal bilaterally IX,X: uvula rises symmetrically XI: bilateral shoulder shrug XII: midline tongue extension Motor: Upper extremities are 5/5.  Bilateral hip flexion, knee flexion, knee extension 5/5.  Patient is right dorsiflexion, plantarflexion, ankle eversion and ankle inversion are all 4/5.  On the left foot ankle eversion is 2/5, inversion is 0/5, dorsi flexion is 0 4/5 plantar flexion is 4/5 Sensory: Decreased FT and temp sensation to distal 1/3 of left thigh and entire left leg below the knee. Mildly decreased proprioception to toes bilaterally.   Deep Tendon Reflexes: 2+ and symmetric throughout  upper extremities, 1+ at the patella bilaterally and no reflexes at the Achilles. Plantars: Right: downgoing   Left: downgoing Cerebellar: normal finger-to-nose,  and normal heel-to-shin test Gait: Gait was not tested    Medications:  Scheduled: . folic acid  1 mg Oral Daily  . [START ON 03/18/2017] losartan  100 mg Oral Daily   And  . [START ON 03/18/2017] hydrochlorothiazide  12.5 mg Oral Daily  . insulin aspart  0-15 Units Subcutaneous Q4H  . lidocaine      . multivitamin with minerals  1 tablet Oral Daily  . nicotine  14 mg Transdermal Daily  . thiamine  100 mg Oral Daily   Or  . thiamine  100 mg Intravenous Daily   Continuous:  [id number], LORazepam **OR** LORazepam  Pertinent Labs/Diagnostics:   Mr Jeri Cos And Wo Contrast  Result Date: 03/16/2017 CLINICAL DATA:  Initial evaluation for possible malignancy/metastatic disease. Patient with lumbar spine lesion. EXAM: MRI HEAD WITHOUT AND WITH CONTRAST TECHNIQUE: Multiplanar, multiecho pulse sequences of the brain and surrounding structures were obtained without and with intravenous contrast. CONTRAST:  47mL MULTIHANCE GADOBENATE DIMEGLUMINE 529 MG/ML IV SOLN COMPARISON:  Comparison made with prior MRI of the lumbar spine from 03/15/2017. FINDINGS: Brain: Cerebral volume is normal for age. No significant cerebral white matter changes identified. No abnormal foci of restricted diffusion to suggest acute or subacute ischemia. Gray-white matter differentiation well maintained. No encephalomalacia to suggest chronic infarction. No foci of susceptibility artifact to suggest acute or chronic intracranial hemorrhage. No mass lesion, midline shift or mass effect. Ventricles normal size without hydrocephalus. No extra-axial fluid collection. Major dural sinuses are patent. No abnormal enhancement. No findings to suggest metastatic disease.  Pituitary gland suprasellar region normal. Midline structures intact and normal. Vascular: Major  intracranial vascular flow voids are well maintained. Skull and upper cervical spine: Craniocervical junction normal. Visualized upper cervical spine within normal limits. Bone marrow signal intensity normal. No scalp soft tissue abnormality. Sinuses/Orbits: Globes and orbital soft tissues within normal limits. Small maxillary sinus retention cyst noted bilaterally. No mastoid effusion. Inner ear structures normal. Other: None. IMPRESSION: Normal MRI of the brain. No intracranial mass lesion or evidence for metastatic disease. Electronically Signed   By: Jeannine Boga M.D.   On: 03/16/2017 21:50   Mr Cervical Spine Wo Contrast  Result Date: 03/16/2017 CLINICAL DATA:  52 year old male with lower extremity weakness, bilateral foot drop not explained on lumbar MRI yesterday without and with contrast. EXAM: MRI CERVICAL AND THORACIC SPINE WITHOUT CONTRAST TECHNIQUE: Multiplanar and multiecho pulse sequences of the cervical and thoracic spine, were obtained without intravenous contrast. COMPARISON:  Lumbar MRI without and with contrast 03/15/2017. FINDINGS: MRI CERVICAL SPINE FINDINGS Alignment: Straightening and mild reversal of cervical lordosis. Vertebrae: Visualized bone marrow signal is within normal limits. No marrow edema or evidence of acute osseous abnormality. Cord: Spinal cord signal is within normal limits at all visualized levels. Posterior Fossa, vertebral arteries, paraspinal tissues: Cervicomedullary junction is within normal limits. Negative visualized brain parenchyma. Small mucous retention cyst in the right maxillary sinus. Preserved major vascular flow voids in the neck. Negative neck soft tissues. Disc levels: C2-C3:  Negative. C3-C4: Disc desiccation with mild circumferential disc bulging. Mildly lobulated posterior and right foraminal components of disc with endplate spurring. No significant spinal stat no spinal stenosis. Mild right C4 neural foraminal stenosis. C4-C5: Minimal disc  bulge and facet hypertrophy. Borderline to mild bilateral C5 foraminal stenosis. C5-C6: Mild circumferential disc bulge. Mild endplate spurring. Borderline to mild bilateral C6 foraminal stenosis. C6-C7: Mild endplate spurring and right facet hypertrophy. No stenosis. C7-T1: Mild to moderate facet hypertrophy. Mild left C8 neural foraminal stenosis. MRI THORACIC SPINE FINDINGS Thoracic spine segmentation: Appears normal and is concordant with the lumbar spine numbering yesterday. Alignment:  Normal thoracic kyphosis. Vertebrae: Subtle T8 superior endplate deformity appears to be chronic or congenital. Normal vertebral height elsewhere. Normal bone marrow signal. No marrow edema or evidence of acute osseous abnormality. Cord: Spinal cord signal is within normal limits at all visualized levels. Normal conus medullaris at T12-L1. Paraspinal and other soft tissues: Normal visible thoracic and upper abdominal viscera. Negative visualized posterior paraspinal soft tissues. Disc levels: T1-T2: Negative. T2-T3: Mild to moderate facet hypertrophy. Moderate left and mild right T2 neural foraminal stenosis. T3-T4: Mild facet hypertrophy.  No stenosis. T4-T5: Mild facet hypertrophy.  No stenosis. T5-T6: Negative. T6-T7: Negative. T7-T8: Negative. T8-T9: Mild disc desiccation and circumferential disc bulge. Superimposed small central disc protrusion (series 14, image 26). Narrowing of the ventral CSF space but no spinal stenosis. Mild facet hypertrophy. No foraminal stenosis. T9-T10: Mild disc desiccation and circumferential disc bulge. Small right paracentral disc protrusion with annular fissure (series 14, image 29). Narrowing of the ventral CSF space but no spinal stenosis. Mild facet hypertrophy. Borderline to mild left T9 foraminal stenosis. T10-T11: Mild facet hypertrophy. Borderline to mild bilateral T10 foraminal stenosis. T11-T12: Mild facet hypertrophy. Mild left and borderline to mild right T11 foraminal stenosis.  T12-L1: Negative. IMPRESSION: 1. Intermittent cervical spine disc and endplate degeneration with no spinal stenosis. Normal cervical spinal cord. 2. Thoracic disc degeneration at T8-T9 and T9-T10. Small thoracic disc herniations at both levels not resulting in thoracic spinal  stenosis. Normal thoracic spinal cord otherwise. 3. Intermittent mild cervical and thoracic neural foraminal stenosis, aside from moderate left thoracic neural foraminal stenosis at the T2 nerve level. 4. Note that this study was done without IV contrast, as Brain MRI without and with contrast is still pending at this time (scheduled for later today), and decision was made to follow-up with postcontrast cervical and/or spine imaging as necessary. But no cervical or thoracic spine lesion is identified for which IV contrast would be valuable. Electronically Signed   By: Genevie Ann M.D.   On: 03/16/2017 12:50   Mr Thoracic Spine Wo Contrast  Result Date: 03/16/2017 CLINICAL DATA:  52 year old male with lower extremity weakness, bilateral foot drop not explained on lumbar MRI yesterday without and with contrast. EXAM: MRI CERVICAL AND THORACIC SPINE WITHOUT CONTRAST TECHNIQUE: Multiplanar and multiecho pulse sequences of the cervical and thoracic spine, were obtained without intravenous contrast. COMPARISON:  Lumbar MRI without and with contrast 03/15/2017. FINDINGS: MRI CERVICAL SPINE FINDINGS Alignment: Straightening and mild reversal of cervical lordosis. Vertebrae: Visualized bone marrow signal is within normal limits. No marrow edema or evidence of acute osseous abnormality. Cord: Spinal cord signal is within normal limits at all visualized levels. Posterior Fossa, vertebral arteries, paraspinal tissues: Cervicomedullary junction is within normal limits. Negative visualized brain parenchyma. Small mucous retention cyst in the right maxillary sinus. Preserved major vascular flow voids in the neck. Negative neck soft tissues. Disc levels:  C2-C3:  Negative. C3-C4: Disc desiccation with mild circumferential disc bulging. Mildly lobulated posterior and right foraminal components of disc with endplate spurring. No significant spinal stat no spinal stenosis. Mild right C4 neural foraminal stenosis. C4-C5: Minimal disc bulge and facet hypertrophy. Borderline to mild bilateral C5 foraminal stenosis. C5-C6: Mild circumferential disc bulge. Mild endplate spurring. Borderline to mild bilateral C6 foraminal stenosis. C6-C7: Mild endplate spurring and right facet hypertrophy. No stenosis. C7-T1: Mild to moderate facet hypertrophy. Mild left C8 neural foraminal stenosis. MRI THORACIC SPINE FINDINGS Thoracic spine segmentation: Appears normal and is concordant with the lumbar spine numbering yesterday. Alignment:  Normal thoracic kyphosis. Vertebrae: Subtle T8 superior endplate deformity appears to be chronic or congenital. Normal vertebral height elsewhere. Normal bone marrow signal. No marrow edema or evidence of acute osseous abnormality. Cord: Spinal cord signal is within normal limits at all visualized levels. Normal conus medullaris at T12-L1. Paraspinal and other soft tissues: Normal visible thoracic and upper abdominal viscera. Negative visualized posterior paraspinal soft tissues. Disc levels: T1-T2: Negative. T2-T3: Mild to moderate facet hypertrophy. Moderate left and mild right T2 neural foraminal stenosis. T3-T4: Mild facet hypertrophy.  No stenosis. T4-T5: Mild facet hypertrophy.  No stenosis. T5-T6: Negative. T6-T7: Negative. T7-T8: Negative. T8-T9: Mild disc desiccation and circumferential disc bulge. Superimposed small central disc protrusion (series 14, image 26). Narrowing of the ventral CSF space but no spinal stenosis. Mild facet hypertrophy. No foraminal stenosis. T9-T10: Mild disc desiccation and circumferential disc bulge. Small right paracentral disc protrusion with annular fissure (series 14, image 29). Narrowing of the ventral CSF space  but no spinal stenosis. Mild facet hypertrophy. Borderline to mild left T9 foraminal stenosis. T10-T11: Mild facet hypertrophy. Borderline to mild bilateral T10 foraminal stenosis. T11-T12: Mild facet hypertrophy. Mild left and borderline to mild right T11 foraminal stenosis. T12-L1: Negative. IMPRESSION: 1. Intermittent cervical spine disc and endplate degeneration with no spinal stenosis. Normal cervical spinal cord. 2. Thoracic disc degeneration at T8-T9 and T9-T10. Small thoracic disc herniations at both levels not resulting in thoracic spinal  stenosis. Normal thoracic spinal cord otherwise. 3. Intermittent mild cervical and thoracic neural foraminal stenosis, aside from moderate left thoracic neural foraminal stenosis at the T2 nerve level. 4. Note that this study was done without IV contrast, as Brain MRI without and with contrast is still pending at this time (scheduled for later today), and decision was made to follow-up with postcontrast cervical and/or spine imaging as necessary. But no cervical or thoracic spine lesion is identified for which IV contrast would be valuable. Electronically Signed   By: Genevie Ann M.D.   On: 03/16/2017 12:50   Mr Lumbar Spine W Wo Contrast  Result Date: 03/15/2017 CLINICAL DATA:  Back pain for 1 month. Multiple falls. Tingling in both legs. EXAM: MRI LUMBAR SPINE WITHOUT AND WITH CONTRAST TECHNIQUE: Multiplanar and multiecho pulse sequences of the lumbar spine were obtained without and with intravenous contrast. CONTRAST:  83mL MULTIHANCE GADOBENATE DIMEGLUMINE 529 MG/ML IV SOLN COMPARISON:  None. FINDINGS: Segmentation:  Standard. Alignment:  Anatomic. Vertebrae:  No worrisome osseous lesion. Conus medullaris and cauda equina: Conus extends to the L1 level. Normal signal and morphology to the conus. At the L2-3 level, there is an enhancing 5 mm spherical lesion, midline location intradural, which is adjacent to the filum terminale and adjacent to multiple cauda equina  nerve roots. The lesion demonstrates intrinsic T1 and T2 hypointensity, with a small area of suspected central necrosis. No other similar lesions are seen. Paraspinal and other soft tissues: Unremarkable. Disc levels: L1-L2:  Normal. L2-L3:  Normal. L3-L4:  Normal disc space.  Mild facet arthropathy.  No impingement. L4-L5:  Normal disc space.  Mild facet arthropathy.  No impingement. L5-S1: Central protrusion. Mild facet arthropathy. No impingement. IMPRESSION: Enhancing intradural midline 5 mm spherical lesion, adjacent to the filum terminale and multiple cauda equina nerve roots. 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 Short-term follow-up 3-6 months recommended with repeat lumbar MRI without and with contrast. In addition, elective, not urgent, neurosurgical consultation may be warranted. L5-S1 central protrusion.  No impingement. Electronically Signed   By: Staci Righter M.D.   On: 03/15/2017 20:47   Dg Fluoro Guided Needle Plc Aspiration/injection Loc  Result Date: 03/17/2017 CLINICAL DATA:  Back pain and tingling in both legs. Nerve root lesion demonstrated on MRI dated 03/15/2017. EXAM: DIAGNOSTIC LUMBAR PUNCTURE UNDER FLUOROSCOPIC GUIDANCE FLUOROSCOPY TIME:  Fluoroscopy Time:  26 seconds Radiation Exposure Index (if provided by the fluoroscopic device): 10.98 mGy PROCEDURE: Informed consent was obtained from the patient prior to the procedure, including potential complications of headache, allergy, and pain. With the patient prone, the lower back was prepped with Betadine. 1% Lidocaine was used for local anesthesia. Lumbar puncture was performed at the L3-4 level using a 21 gauge needle with return of clear colorless CSF. 25 ml of CSF were obtained for laboratory studies. The patient tolerated the procedure well and there were no apparent complications. IMPRESSION: Lumbar puncture performed at L3-4 without  difficulty and without immediate complication. Laboratory reports to follow. Electronically Signed   By: Lorriane Shire M.D.   On: 03/17/2017 15:12     Etta Quill PA-C [facility] [phone]  Impression: Patient with bilateral lower extremity weakness especially in the ankles.  Patient's left foot is greater than right foot.  As the right foot has 4/5 if not 5/5 strength at the ankle where as left foot has foot drop with significant weakness in the eversion and  eversion strength.MRI lumbar spine reveals an enhancing intradural midline 5 mm spherical lesion, adjacent to the filum terminale and multiple cauda equina nerve roots. 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 The lesion is seen anterior and directly abutting the nerve roots of the cauda equina at the midline; tracing the roots caudally suggests that the mass may be directly abutting the L5 nerve roots.  Neurosurgery has been consulted in addition.  Bar puncture has been obtained today.  Will need further time for cytology.  As noted above if the patient does not have a biopsy patient will definitely need a EMG nerve conduction study in approximately 1-2 weeks.     Recommendations: --Lumbar puncture has been obtained will await further reevaluation lab results.cell count with differential, protein, glucose, cytology, EBV PCR, VDRL.   --Neurosurgery consult has been obtained will await to see if they will do a possible biopsy  --Stated above if unrevealing will need a EMG nerve conduction study in 1-2 weeks. --AFO for left foot as he is having left leg cramping. --Consider baclofen or some form of muscle relaxant at night to help him with left calf cramping   03/17/2017, 3:19 PM

## 2017-03-18 LAB — EPSTEIN BARR VRS(EBV DNA BY PCR)
EBV DNA QN BY PCR: NEGATIVE {copies}/mL
LOG10 EBV DNA QN PCR: UNDETERMINED {Log_copies}/mL

## 2017-03-18 LAB — VDRL, CSF: SYPHILIS VDRL QUANT CSF: NONREACTIVE

## 2017-03-18 LAB — GLUCOSE, CAPILLARY
GLUCOSE-CAPILLARY: 117 mg/dL — AB (ref 65–99)
GLUCOSE-CAPILLARY: 187 mg/dL — AB (ref 65–99)
GLUCOSE-CAPILLARY: 196 mg/dL — AB (ref 65–99)
Glucose-Capillary: 239 mg/dL — ABNORMAL HIGH (ref 65–99)
Glucose-Capillary: 188 mg/dL — ABNORMAL HIGH (ref 65–99)

## 2017-03-18 LAB — HERPES SIMPLEX VIRUS(HSV) DNA BY PCR
HSV 1 DNA: NEGATIVE
HSV 2 DNA: NEGATIVE

## 2017-03-18 LAB — HEPATITIS PANEL, ACUTE
HCV Ab: <0.1 s/co ratio (ref 0.0–0.9)
HEP B C IGM: NEGATIVE
Hep A IgM: NEGATIVE
Hepatitis B Surface Ag: NEGATIVE

## 2017-03-18 NOTE — Care Management Note (Signed)
Case Management Note  Patient Details  Name: Kyle Novak MRN: [id number] Date of Birth: 1965-10-29  Subjective/Objective:  Pt admitted with Spinal Cord Tumor                  Action/Plan: Pt declined DME &  HH at present time.   Expected Discharge Date:                  Expected Discharge Plan:  Home/Self Care  In-House Referral:     Discharge planning Services  CM Consult  Post Acute Care Choice:    Choice offered to:  Patient  DME Arranged:  (Pt declined DME at present time) DME Agency:     HH Arranged:    [place] Agency:     Status of Service:  Completed, signed off  If discussed at [employer], dates discussed:    Additional CommentsPurcell Mouton, RN 03/18/2017, 11:39 AM

## 2017-03-18 NOTE — Progress Notes (Signed)
Physical Therapy Treatment/Discharge Patient Details Name: Kyle Novak MRN: [id number] DOB: [birth year] Today's Date: 03/18/2017    History of Present Illness Kyle Novak is an 52 y.o. male who presented to the [facility] ED on Monday with a chief complaint of bilateral foot drop, left worse than right.  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  Also noted is an L5-S1 central protrusion without nerve root impingement.    PT Comments    Pt was dressed and sitting on EOB on arrival. Pt reported no change in decreased sensation in Bil LE, remained unable to actively dorsiflex L foot, limited active dorsiflexion for R foot. Pt had received L AFO this morning, which he wore while ambulating with PT, no assistive device needed. Pt noted improved ability to walk with AFO, discussed the possibility of a second AFO for the right (observed increased hip/knee flexion during gait and decreased DF) if sensation/strength does not improve and to follow up with neurologic MD at next visit if needed. Reminded pt to check feet daily due to decreased sensation. Pt agreeable to no further acute needs, will sign off.    Follow Up Recommendations  Outpatient PT (neurorehabilitation)     Equipment Recommendations  None recommended by PT    Recommendations for Other Services       Precautions / Restrictions Precautions Precautions: Fall Precaution Comments: foot drop, received fitted AFO for left side (03/18/17) Required Braces or Orthoses: Other Brace/Splint Other Brace/Splint: AFO - left    Mobility  Bed Mobility               General bed mobility comments: Pt on EOB on arriving, reviewed spinal precautions verbally  Transfers Overall transfer level: Modified independent Equipment used: None Transfers: Sit to/from Stand Sit to Stand: Modified independent  (Device/Increase time)         General transfer comment: up without device, increased time  Ambulation/Gait Ambulation/Gait assistance: Supervision;Modified independent (Device/Increase time) Ambulation Distance (Feet): 400 Feet Assistive device: None Gait Pattern/deviations: Step-through pattern;Steppage;Decreased dorsiflexion - right;Decreased dorsiflexion - left;Decreased stride length     General Gait Details: observed gait improvement with L AFO, supervision for safety initially; cues for energy conservation, posture, safety; pt with increased hip/knee flexion on right swing phase to clear right foot (?may possibly need right AFO in future, recommended pt f/u with neurologist)   Stairs Stairs: Yes   Stair Management: One rail Left;Alternating pattern;Forwards Number of Stairs: 10 General stair comments: supervision for safety, used step through sequence for ascending/descending; cues for safety, slow pace  Wheelchair Mobility    Modified Rankin (Stroke Patients Only)       Balance Overall balance assessment: Independent   Sitting balance-Leahy Scale: Normal Sitting balance - Comments: pt donned shoes EOB and reached over to floor      Standing balance-Leahy Scale: Good Standing balance comment: static balance good                            Cognition Arousal/Alertness: Awake/alert Behavior During Therapy: WFL for tasks assessed/performed Overall Cognitive Status: Within Functional Limits for tasks assessed                                        Exercises  General Comments General comments (skin integrity, edema, etc.): recieved AFO for L foot, discussed possible need for another for R, depending on sensation/strength improvements      Pertinent Vitals/Pain Pain Assessment: 0-10 Pain Score: 4  Pain Location: bilateral LE's at knee and down Pain Descriptors / Indicators: Tingling;Numbness Pain Intervention(s): Limited  activity within patient's tolerance;Monitored during session    Home Living                      Prior Function            PT Goals (current goals can now be found in the care plan section) Progress towards PT goals: Goals met/education completed, patient discharged from PT    Frequency           PT Plan Other (comment)(d/c from acute PT)    Co-evaluation              AM-PAC PT "6 Clicks" Daily Activity  Outcome Measure  Difficulty turning over in bed (including adjusting bedclothes, sheets and blankets)?: None Difficulty moving from lying on back to sitting on the side of the bed? : A Little Difficulty sitting down on and standing up from a chair with arms (e.g., wheelchair, bedside commode, etc,.)?: A Little Help needed moving to and from a bed to chair (including a wheelchair)?: A Little Help needed walking in hospital room?: A Little Help needed climbing 3-5 steps with a railing? : A Little 6 Click Score: 19    End of Session   Activity Tolerance: Patient tolerated treatment well Patient left: in bed Nurse Communication: Mobility status;Other (comment)(RN notified that pt does not need acute PT for remainder of stay, safe to ambulate independently) PT Visit Diagnosis: Other abnormalities of gait and mobility (R26.89);Other symptoms and signs involving the nervous system (R29.898)     Time: 8937-3428 PT Time Calculation (min) (ACUTE ONLY): 22 min  Charges:  $Gait Training: 8-22 mins                    G Codes:      [place] Sebastain Fishbaugh, SPT  [place] Nivedita Mirabella 03/18/2017, 4:12 PM

## 2017-03-18 NOTE — Progress Notes (Signed)
Reason for consult:   Subjective: patient feels fine, eager to go home. No change in his leg weakness.    ROS: negative except above   Examination  Vital signs in last 24 hours: Temp:  [98 F (36.7 C)-98.3 F (36.8 C)] 98.3 F (36.8 C) (01/31 1315) Pulse Rate:  [94-131] 118 (01/31 1315) Resp:  [18-20] 18 (01/31 1315) BP: (122-139)/(83-96) 136/92 (01/31 1315) SpO2:  [98 %-100 %] 98 % (01/31 1315)  General: Not in distress, cooperative CVS: pulse-normal rate and rhythm RS: breathing comfortably Extremities: normal   Neuro: MS: Alert, oriented, follows command CN: pupils equal and reactive,  EOMI, face symmetric, tongue midline, normal sensation over face, Motor: 5/5 strength in both upper extremities Lower extremities show 4/5 strength in dorsiflexion on right leg, 2/5 to dorsiflexion on left leg Sensory: reduced sensation to light touch, temp upto knees Coordination: normal Gait: not tested  Basic Metabolic Panel: Recent Labs  Lab 03/15/17 1954 03/17/17 0454  NA 139 138  K 3.8 3.7  CL 106 106  CO2 27 24  GLUCOSE 208* 174*  BUN 13 14  CREATININE 0.67 0.80  CALCIUM 9.5 9.4  MG 1.9 1.9    CBC: Recent Labs  Lab 03/15/17 1954 03/17/17 0454  WBC 9.5 10.7*  NEUTROABS 5.3 6.6  HGB 14.6 14.5  HCT 43.4 42.5  MCV 87.7 86.9  PLT 327 356     Coagulation Studies: No results for input(s): LABPROT, INR in the last 72 hours.  Imaging Reviewed: MRI brain, C and t spine, L spine    ASSESSMENT AND PLAN  30y M with poorly controlled DM presents with numbness in both feet going on for months and several weeks of difficulty walking. Noted to have bilateral foot drop on exam L> R. MRI L spine showed an enhancing 14mm  lesion adjacent to the filum terminale and multiple cauda equina nerve roots. Lesion is favored to represent a schwannoma, however radiology also felt it could be ependymoma, or  drop metastasis.   Patient was admitted and had extensive workup  including MRI brain and C, T spine which showed no enhancing or demyelinating lesions. LP was performed which showed Protein 41, 1 cell, elevated glucose. Cytology was sent and pending, oligoclonal bands also pending. Normal protein reassuring this is not inflammatory polyradiculoneuropathy.    I do not suspect that the spinal lesion is causing his foot drop as it is non compressive and he denies any radicular pain.  Exam more suggestive of peripheral neuropathy given stocking distribution.   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

## 2017-03-18 NOTE — Progress Notes (Signed)
PROGRESS NOTE    Kyle Novak  QIH:[id number] DOB: 1965-03-18 DOA: 03/15/2017 PCP: [facility]    Brief Narrative:  52 y.o. male history significant for diabetes and hypertension presents the emergency room with upper extremity weakness.  Patient states the feeling is hard to lift his feet off the ground.  Has to step high in order to walk.  This is been going on for several weeks.  Continues to worsen.  No past episodes.  Denies any trauma.  Patient have not seen his primary care doctor for this who referred him to neurology.  Assessment & Plan:   Principal Problem:   Spinal cord tumor Active Problems:   Diabetes mellitus without complication ([place])   Hypertension   Spinal cord mass ([place])  Weakness with Spinal lesion on mri Neurology and neurosurg consulted and following Patient is s/p LP, results pending. Thus far unremarkable Cervical and thoracic MRI reviewed, no other lesions noted Neurosurgery and Neurology following. No need for biopsy per Neurosurgery  Hypertension Currently on losartan-hctz at home, continued Remains stable  Insulin dependent DM2 On tresiba at home Continue SSI coverageas tolerated Will check a1c. Last was checked 8 months ago  ETOH abuse Multiple cans of beer and shots, 4 days a week Continue with CIWA protocol Remains without withdrawals  Current everyday smoker: nicotine patch provided. Stable at present  DVT prophylaxis: SCD's Code Status: Full Family Communication: Pt in room, family not at bedside Disposition Plan: Uncertain at this time  Consultants:   Neurosurgery  Neurology  Procedures:   LP 1/30  Antimicrobials: Anti-infectives (From admission, onward)   None      Subjective: No complaints at present  Objective: Vitals:   03/17/17 2124 03/18/17 0419 03/18/17 0930 03/18/17 1315  BP:  (!) 122/96 130/83 (!) 136/92  Pulse: (!) 118 94 98 (!) 118  Resp:  18 20 18   Temp:  98.2 F (36.8  C) 98.3 F (36.8 C) 98.3 F (36.8 C)  TempSrc:  Oral Oral Oral  SpO2:  100% 98% 98%  Weight:      Height:        Intake/Output Summary (Last 24 hours) at 03/18/2017 1710 Last data filed at 03/18/2017 1200 Gross per 24 hour  Intake 120 ml  Output -  Net 120 ml   Filed Weights   03/16/17 0313  Weight: 73.4 kg (161 lb 13.1 oz)    Examination: General exam: Awake, laying in bed, in nad Respiratory system: Normal respiratory effort, no wheezing Cardiovascular system: regular rate, s1, s2 Gastrointestinal system: Soft, nondistended, positive BS Central nervous system: CN2-12 grossly intact, strength intact Extremities: Perfused, no clubbing Skin: Normal skin turgor, no notable skin lesions seen Psychiatry: Mood normal // no visual hallucinations   Data Reviewed: I have personally reviewed following labs and imaging studies  CBC: Recent Labs  Lab 03/15/17 1954 03/17/17 0454  WBC 9.5 10.7*  NEUTROABS 5.3 6.6  HGB 14.6 14.5  HCT 43.4 42.5  MCV 87.7 86.9  PLT 327 875   Basic Metabolic Panel: Recent Labs  Lab 03/15/17 1954 03/17/17 0454  NA 139 138  K 3.8 3.7  CL 106 106  CO2 27 24  GLUCOSE 208* 174*  BUN 13 14  CREATININE 0.67 0.80  CALCIUM 9.5 9.4  MG 1.9 1.9   GFR: Estimated Creatinine Clearance: 105.7 mL/min (by C-G formula based on SCr of 0.8 mg/dL). Liver Function Tests: Recent Labs  Lab 03/17/17 0454  AST 21  ALT 24  ALKPHOS 95  BILITOT 0.8  PROT 7.5  ALBUMIN 4.3   No results for input(s): LIPASE, AMYLASE in the last 168 hours. No results for input(s): AMMONIA in the last 168 hours. Coagulation Profile: No results for input(s): INR, PROTIME in the last 168 hours. Cardiac Enzymes: Recent Labs  Lab 03/15/17 1954  CKTOTAL 77   BNP (last 3 results) No results for input(s): PROBNP in the last 8760 hours. HbA1C: No results for input(s): HGBA1C in the last 72 hours. CBG: Recent Labs  Lab 03/17/17 2346 03/18/17 0417 03/18/17 0802  03/18/17 1149 03/18/17 1631  GLUCAP 169* 117* 239* 188* 187*   Lipid Profile: No results for input(s): CHOL, HDL, LDLCALC, TRIG, CHOLHDL, LDLDIRECT in the last 72 hours. Thyroid Function Tests: No results for input(s): TSH, T4TOTAL, FREET4, T3FREE, THYROIDAB in the last 72 hours. Anemia Panel: Recent Labs    03/15/17 1954  VITAMINB12 1,224*   Sepsis Labs: No results for input(s): PROCALCITON, LATICACIDVEN in the last 168 hours.  Recent Results (from the past 240 hour(s))  CSF culture     Status: None (Preliminary result)   Collection Time: 03/17/17  2:39 PM  Result Value Ref Range Status   Specimen Description CSF  Final   Special Requests NONE  Final   Gram Stain   Final    WBC PRESENT, PREDOMINANTLY MONONUCLEAR NO ORGANISMS SEEN CYTOSPIN SMEAR Gram Stain Report Called to,Read Back By and Verified With: CHEEK,K @ [id number] ON [id number] BY POTEAT,S    Culture   Final    NO GROWTH < 24 HOURS Performed at [facility], [place] [address] [place], [place] [address]    Report Status PENDING  Incomplete     Radiology Studies: Mr Jeri Cos And Wo Contrast  Result Date: 03/16/2017 CLINICAL DATA:  Initial evaluation for possible malignancy/metastatic disease. Patient with lumbar spine lesion. EXAM: MRI HEAD WITHOUT AND WITH CONTRAST TECHNIQUE: Multiplanar, multiecho pulse sequences of the brain and surrounding structures were obtained without and with intravenous contrast. CONTRAST:  55mL MULTIHANCE GADOBENATE DIMEGLUMINE 529 MG/ML IV SOLN COMPARISON:  Comparison made with prior MRI of the lumbar spine from 03/15/2017. FINDINGS: Brain: Cerebral volume is normal for age. No significant cerebral white matter changes identified. No abnormal foci of restricted diffusion to suggest acute or subacute ischemia. Gray-white matter differentiation well maintained. No encephalomalacia to suggest chronic infarction. No foci of susceptibility artifact to suggest acute or chronic intracranial hemorrhage.  No mass lesion, midline shift or mass effect. Ventricles normal size without hydrocephalus. No extra-axial fluid collection. Major dural sinuses are patent. No abnormal enhancement. No findings to suggest metastatic disease. Pituitary gland suprasellar region normal. Midline structures intact and normal. Vascular: Major intracranial vascular flow voids are well maintained. Skull and upper cervical spine: Craniocervical junction normal. Visualized upper cervical spine within normal limits. Bone marrow signal intensity normal. No scalp soft tissue abnormality. Sinuses/Orbits: Globes and orbital soft tissues within normal limits. Small maxillary sinus retention cyst noted bilaterally. No mastoid effusion. Inner ear structures normal. Other: None. IMPRESSION: Normal MRI of the brain. No intracranial mass lesion or evidence for metastatic disease. Electronically Signed   By: Jeannine Boga M.D.   On: 03/16/2017 21:50   Dg Fluoro Guided Needle Plc Aspiration/injection Loc  Result Date: 03/17/2017 CLINICAL DATA:  Back pain and tingling in both legs. Nerve root lesion demonstrated on MRI dated 03/15/2017. EXAM: DIAGNOSTIC LUMBAR PUNCTURE UNDER FLUOROSCOPIC GUIDANCE FLUOROSCOPY TIME:  Fluoroscopy Time:  26 seconds Radiation Exposure Index (if provided by  the fluoroscopic device): 10.98 mGy PROCEDURE: Informed consent was obtained from the patient prior to the procedure, including potential complications of headache, allergy, and pain. With the patient prone, the lower back was prepped with Betadine. 1% Lidocaine was used for local anesthesia. Lumbar puncture was performed at the L3-4 level using a 21 gauge needle with return of clear colorless CSF. 25 ml of CSF were obtained for laboratory studies. The patient tolerated the procedure well and there were no apparent complications. IMPRESSION: Lumbar puncture performed at L3-4 without difficulty and without immediate complication. Laboratory reports to follow.  Electronically Signed   By: Lorriane Shire M.D.   On: 03/17/2017 15:12    Scheduled Meds: . folic acid  1 mg Oral Daily  . losartan  100 mg Oral Daily   And  . hydrochlorothiazide  12.5 mg Oral Daily  . insulin aspart  0-15 Units Subcutaneous TID WC  . multivitamin with minerals  1 tablet Oral Daily  . nicotine  14 mg Transdermal Daily  . thiamine  100 mg Oral Daily   Or  . thiamine  100 mg Intravenous Daily   Continuous Infusions:   LOS: 2 days   Marylu Lund, MD [facility] Pager [phone]  If 7PM-7AM, please contact night-coverage [email or web address] Password [email or web address] 03/18/2017, 5:10 PM

## 2017-03-19 DIAGNOSIS — M899 Disorder of bone, unspecified: Secondary | ICD-10-CM

## 2017-03-19 LAB — HEMOGLOBIN A1C
HEMOGLOBIN A1C: 7.6 % — AB (ref 4.8–5.6)
MEAN PLASMA GLUCOSE: 171.42 mg/dL

## 2017-03-19 LAB — GLUCOSE, CAPILLARY
GLUCOSE-CAPILLARY: 200 mg/dL — AB (ref 65–99)
Glucose-Capillary: 248 mg/dL — ABNORMAL HIGH (ref 65–99)

## 2017-03-19 MED ORDER — CYCLOBENZAPRINE HCL 5 MG PO TABS: 5.0000 mg | ORAL_TABLET | Freq: Every evening | ORAL | 0 refills | Status: DC | PRN

## 2017-03-19 NOTE — Discharge Summary (Signed)
Physician Discharge Summary  BRAY VICKERMAN YTK:[id number] DOB: 1966/01/12 DOA: 03/15/2017  PCP: [facility]  Admit date: 03/15/2017 Discharge date: 03/19/2017  Admitted From: Home Disposition:  Home  Recommendations for Outpatient Follow-up:  1. Follow up with PCP in 2-3 weeks 2. Follow up with Neurosurgery as scheduled 3. Follow up with Neurology as scheduled   Discharge Condition:Stable CODE STATUS:Full Diet recommendation: Diabetic   Brief/Interim Summary: 52 y.o.malehistory significant for diabetes and hypertension presents the emergency room with upper extremity weakness. Patient states the feeling is hard to lift his feet off the ground. Has to step high in order to walk. This is been going on for several weeks. Continues to worsen. No past episodes. Denies any trauma. Patient have not seen his primary care doctor for this who referred him to neurology.  Weakness withSpinal lesionon mri Neurologyand neurosurg consulted and following Patient is s/p LP, results pending. Thus far unremarkable Cervical and thoracic MRI reviewed, no other lesions noted Neurosurgery and Neurology following. 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 Stable at present   Discharge Diagnoses:  Principal Problem:   Spinal cord tumor Active Problems:   Diabetes mellitus without complication ([place])   Hypertension   Spinal cord mass [facility])    Discharge Instructions   Allergies as of 03/19/2017   No Known Allergies     Medication List    STOP taking these medications   lidocaine  5 % Commonly known as:  LIDODERM   methocarbamol 500 MG tablet Commonly known as:  ROBAXIN   naproxen 500 MG tablet Commonly known as:  NAPROSYN     TAKE these medications   cyclobenzaprine 5 MG tablet Commonly known as:  FLEXERIL Take 1 tablet (5 mg total) by mouth at bedtime as needed for muscle spasms.   losartan-hydrochlorothiazide 100-12.5 MG tablet Commonly known as:  HYZAAR Take 1 tablet by mouth daily.   multivitamin with minerals Tabs tablet Take 1 tablet by mouth daily.   TRESIBA FLEXTOUCH 200 UNIT/ML Sopn Generic drug:  Insulin Degludec Inject 1 each into the skin daily.      Follow-up Information    Ashok Pall, MD Follow up in 2 week(s).   Specialty:  Neurosurgery Why:  please call the office to make an appointment.  Contact information: [address] [address] [phone]        [facility]. Schedule an appointment as soon as possible for a visit in 2 week(s).   Specialty:  Family Medicine Contact information: [address] [address] [phone]        [facility]. Schedule an appointment as soon as possible for a visit.   Why:  as scheduled Contact information: [address] [place] [address]     [place] [address] [phone]         No Known Allergies  Consultations:  Neurology  Neurosurgery  Procedures/Studies: Mr Jeri Cos And Wo Contrast  Result Date: 03/16/2017 CLINICAL DATA:  Initial evaluation for possible malignancy/metastatic disease. Patient with lumbar spine lesion. EXAM: MRI HEAD WITHOUT AND WITH CONTRAST TECHNIQUE: Multiplanar, multiecho pulse  sequences of the brain and surrounding structures were obtained without and with intravenous contrast. CONTRAST:  54mL MULTIHANCE GADOBENATE DIMEGLUMINE 529 MG/ML IV SOLN COMPARISON:  Comparison made with prior MRI of the lumbar spine from 03/15/2017. FINDINGS:  Brain: Cerebral volume is normal for age. No significant cerebral white matter changes identified. No abnormal foci of restricted diffusion to suggest acute or subacute ischemia. Gray-white matter differentiation well maintained. No encephalomalacia to suggest chronic infarction. No foci of susceptibility artifact to suggest acute or chronic intracranial hemorrhage. No mass lesion, midline shift or mass effect. Ventricles normal size without hydrocephalus. No extra-axial fluid collection. Major dural sinuses are patent. No abnormal enhancement. No findings to suggest metastatic disease. Pituitary gland suprasellar region normal. Midline structures intact and normal. Vascular: Major intracranial vascular flow voids are well maintained. Skull and upper cervical spine: Craniocervical junction normal. Visualized upper cervical spine within normal limits. Bone marrow signal intensity normal. No scalp soft tissue abnormality. Sinuses/Orbits: Globes and orbital soft tissues within normal limits. Small maxillary sinus retention cyst noted bilaterally. No mastoid effusion. Inner ear structures normal. Other: None. IMPRESSION: Normal MRI of the brain. No intracranial mass lesion or evidence for metastatic disease. Electronically Signed   By: Jeannine Boga M.D.   On: 03/16/2017 21:50   Mr Cervical Spine Wo Contrast  Result Date: 03/16/2017 CLINICAL DATA:  52 year old male with lower extremity weakness, bilateral foot drop not explained on lumbar MRI yesterday without and with contrast. EXAM: MRI CERVICAL AND THORACIC SPINE WITHOUT CONTRAST TECHNIQUE: Multiplanar and multiecho pulse sequences of the cervical and thoracic spine, were obtained without intravenous contrast. COMPARISON:  Lumbar MRI without and with contrast 03/15/2017. FINDINGS: MRI CERVICAL SPINE FINDINGS Alignment: Straightening and mild reversal of cervical lordosis. Vertebrae: Visualized bone marrow signal is within normal limits. No marrow edema or  evidence of acute osseous abnormality. Cord: Spinal cord signal is within normal limits at all visualized levels. Posterior Fossa, vertebral arteries, paraspinal tissues: Cervicomedullary junction is within normal limits. Negative visualized brain parenchyma. Small mucous retention cyst in the right maxillary sinus. Preserved major vascular flow voids in the neck. Negative neck soft tissues. Disc levels: C2-C3:  Negative. C3-C4: Disc desiccation with mild circumferential disc bulging. Mildly lobulated posterior and right foraminal components of disc with endplate spurring. No significant spinal stat no spinal stenosis. Mild right C4 neural foraminal stenosis. C4-C5: Minimal disc bulge and facet hypertrophy. Borderline to mild bilateral C5 foraminal stenosis. C5-C6: Mild circumferential disc bulge. Mild endplate spurring. Borderline to mild bilateral C6 foraminal stenosis. C6-C7: Mild endplate spurring and right facet hypertrophy. No stenosis. C7-T1: Mild to moderate facet hypertrophy. Mild left C8 neural foraminal stenosis. MRI THORACIC SPINE FINDINGS Thoracic spine segmentation: Appears normal and is concordant with the lumbar spine numbering yesterday. Alignment:  Normal thoracic kyphosis. Vertebrae: Subtle T8 superior endplate deformity appears to be chronic or congenital. Normal vertebral height elsewhere. Normal bone marrow signal. No marrow edema or evidence of acute osseous abnormality. Cord: Spinal cord signal is within normal limits at all visualized levels. Normal conus medullaris at T12-L1. Paraspinal and other soft tissues: Normal visible thoracic and upper abdominal viscera. Negative visualized posterior paraspinal soft tissues. Disc levels: T1-T2: Negative. T2-T3: Mild to moderate facet hypertrophy. Moderate left and mild right T2 neural foraminal stenosis. T3-T4: Mild facet hypertrophy.  No stenosis. T4-T5: Mild facet hypertrophy.  No stenosis. T5-T6: Negative. T6-T7: Negative. T7-T8: Negative. T8-T9:  Mild disc desiccation and circumferential disc bulge. Superimposed small central disc protrusion (series 14, image 26). Narrowing of  the ventral CSF space but no spinal stenosis. Mild facet hypertrophy. No foraminal stenosis. T9-T10: Mild disc desiccation and circumferential disc bulge. Small right paracentral disc protrusion with annular fissure (series 14, image 29). Narrowing of the ventral CSF space but no spinal stenosis. Mild facet hypertrophy. Borderline to mild left T9 foraminal stenosis. T10-T11: Mild facet hypertrophy. Borderline to mild bilateral T10 foraminal stenosis. T11-T12: Mild facet hypertrophy. Mild left and borderline to mild right T11 foraminal stenosis. T12-L1: Negative. IMPRESSION: 1. Intermittent cervical spine disc and endplate degeneration with no spinal stenosis. Normal cervical spinal cord. 2. Thoracic disc degeneration at T8-T9 and T9-T10. Small thoracic disc herniations at both levels not resulting in thoracic spinal stenosis. Normal thoracic spinal cord otherwise. 3. Intermittent mild cervical and thoracic neural foraminal stenosis, aside from moderate left thoracic neural foraminal stenosis at the T2 nerve level. 4. Note that this study was done without IV contrast, as Brain MRI without and with contrast is still pending at this time (scheduled for later today), and decision was made to follow-up with postcontrast cervical and/or spine imaging as necessary. But no cervical or thoracic spine lesion is identified for which IV contrast would be valuable. Electronically Signed   By: Genevie Ann M.D.   On: 03/16/2017 12:50   Mr Thoracic Spine Wo Contrast  Result Date: 03/16/2017 CLINICAL DATA:  52 year old male with lower extremity weakness, bilateral foot drop not explained on lumbar MRI yesterday without and with contrast. EXAM: MRI CERVICAL AND THORACIC SPINE WITHOUT CONTRAST TECHNIQUE: Multiplanar and multiecho pulse sequences of the cervical and thoracic spine, were obtained without  intravenous contrast. COMPARISON:  Lumbar MRI without and with contrast 03/15/2017. FINDINGS: MRI CERVICAL SPINE FINDINGS Alignment: Straightening and mild reversal of cervical lordosis. Vertebrae: Visualized bone marrow signal is within normal limits. No marrow edema or evidence of acute osseous abnormality. Cord: Spinal cord signal is within normal limits at all visualized levels. Posterior Fossa, vertebral arteries, paraspinal tissues: Cervicomedullary junction is within normal limits. Negative visualized brain parenchyma. Small mucous retention cyst in the right maxillary sinus. Preserved major vascular flow voids in the neck. Negative neck soft tissues. Disc levels: C2-C3:  Negative. C3-C4: Disc desiccation with mild circumferential disc bulging. Mildly lobulated posterior and right foraminal components of disc with endplate spurring. No significant spinal stat no spinal stenosis. Mild right C4 neural foraminal stenosis. C4-C5: Minimal disc bulge and facet hypertrophy. Borderline to mild bilateral C5 foraminal stenosis. C5-C6: Mild circumferential disc bulge. Mild endplate spurring. Borderline to mild bilateral C6 foraminal stenosis. C6-C7: Mild endplate spurring and right facet hypertrophy. No stenosis. C7-T1: Mild to moderate facet hypertrophy. Mild left C8 neural foraminal stenosis. MRI THORACIC SPINE FINDINGS Thoracic spine segmentation: Appears normal and is concordant with the lumbar spine numbering yesterday. Alignment:  Normal thoracic kyphosis. Vertebrae: Subtle T8 superior endplate deformity appears to be chronic or congenital. Normal vertebral height elsewhere. Normal bone marrow signal. No marrow edema or evidence of acute osseous abnormality. Cord: Spinal cord signal is within normal limits at all visualized levels. Normal conus medullaris at T12-L1. Paraspinal and other soft tissues: Normal visible thoracic and upper abdominal viscera. Negative visualized posterior paraspinal soft tissues. Disc  levels: T1-T2: Negative. T2-T3: Mild to moderate facet hypertrophy. Moderate left and mild right T2 neural foraminal stenosis. T3-T4: Mild facet hypertrophy.  No stenosis. T4-T5: Mild facet hypertrophy.  No stenosis. T5-T6: Negative. T6-T7: Negative. T7-T8: Negative. T8-T9: Mild disc desiccation and circumferential disc bulge. Superimposed small central disc protrusion (series 14, image 26). Narrowing of  the ventral CSF space but no spinal stenosis. Mild facet hypertrophy. No foraminal stenosis. T9-T10: Mild disc desiccation and circumferential disc bulge. Small right paracentral disc protrusion with annular fissure (series 14, image 29). Narrowing of the ventral CSF space but no spinal stenosis. Mild facet hypertrophy. Borderline to mild left T9 foraminal stenosis. T10-T11: Mild facet hypertrophy. Borderline to mild bilateral T10 foraminal stenosis. T11-T12: Mild facet hypertrophy. Mild left and borderline to mild right T11 foraminal stenosis. T12-L1: Negative. IMPRESSION: 1. Intermittent cervical spine disc and endplate degeneration with no spinal stenosis. Normal cervical spinal cord. 2. Thoracic disc degeneration at T8-T9 and T9-T10. Small thoracic disc herniations at both levels not resulting in thoracic spinal stenosis. Normal thoracic spinal cord otherwise. 3. Intermittent mild cervical and thoracic neural foraminal stenosis, aside from moderate left thoracic neural foraminal stenosis at the T2 nerve level. 4. Note that this study was done without IV contrast, as Brain MRI without and with contrast is still pending at this time (scheduled for later today), and decision was made to follow-up with postcontrast cervical and/or spine imaging as necessary. But no cervical or thoracic spine lesion is identified for which IV contrast would be valuable. Electronically Signed   By: Genevie Ann M.D.   On: 03/16/2017 12:50   Mr Lumbar Spine W Wo Contrast  Result Date: 03/15/2017 CLINICAL DATA:  Back pain for 1 month.  Multiple falls. Tingling in both legs. EXAM: MRI LUMBAR SPINE WITHOUT AND WITH CONTRAST TECHNIQUE: Multiplanar and multiecho pulse sequences of the lumbar spine were obtained without and with intravenous contrast. CONTRAST:  38mL MULTIHANCE GADOBENATE DIMEGLUMINE 529 MG/ML IV SOLN COMPARISON:  None. FINDINGS: Segmentation:  Standard. Alignment:  Anatomic. Vertebrae:  No worrisome osseous lesion. Conus medullaris and cauda equina: Conus extends to the L1 level. Normal signal and morphology to the conus. At the L2-3 level, there is an enhancing 5 mm spherical lesion, midline location intradural, which is adjacent to the filum terminale and adjacent to multiple cauda equina nerve roots. The lesion demonstrates intrinsic T1 and T2 hypointensity, with a small area of suspected central necrosis. No other similar lesions are seen. Paraspinal and other soft tissues: Unremarkable. Disc levels: L1-L2:  Normal. L2-L3:  Normal. L3-L4:  Normal disc space.  Mild facet arthropathy.  No impingement. L4-L5:  Normal disc space.  Mild facet arthropathy.  No impingement. L5-S1: Central protrusion. Mild facet arthropathy. No impingement. IMPRESSION: Enhancing intradural midline 5 mm spherical lesion, adjacent to the filum terminale and multiple cauda equina nerve roots. 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 Short-term follow-up 3-6 months recommended with repeat lumbar MRI without and with contrast. In addition, elective, not urgent, neurosurgical consultation may be warranted. L5-S1 central protrusion.  No impingement. Electronically Signed   By: Staci Righter M.D.   On: 03/15/2017 20:47   Dg Fluoro Guided Needle Plc Aspiration/injection Loc  Result Date: 03/17/2017 CLINICAL DATA:  Back pain and tingling in both legs. Nerve root lesion demonstrated on MRI dated 03/15/2017. EXAM: DIAGNOSTIC LUMBAR PUNCTURE UNDER FLUOROSCOPIC GUIDANCE  FLUOROSCOPY TIME:  Fluoroscopy Time:  26 seconds Radiation Exposure Index (if provided by the fluoroscopic device): 10.98 mGy PROCEDURE: Informed consent was obtained from the patient prior to the procedure, including potential complications of headache, allergy, and pain. With the patient prone, the lower back was prepped with Betadine. 1% Lidocaine was used for local anesthesia. Lumbar puncture was performed at the L3-4 level using a 21  gauge needle with return of clear colorless CSF. 25 ml of CSF were obtained for laboratory studies. The patient tolerated the procedure well and there were no apparent complications. IMPRESSION: Lumbar puncture performed at L3-4 without difficulty and without immediate complication. Laboratory reports to follow. Electronically Signed   By: Lorriane Shire M.D.   On: 03/17/2017 15:12     Subjective: Eager to go home  Discharge Exam: Vitals:   03/19/17 0548 03/19/17 0937  BP: 117/87 (!) 147/89  Pulse: (!) 104 (!) 103  Resp: 18 19  Temp: 98.3 F (36.8 C) 98.3 F (36.8 C)  SpO2: 100% 100%   Vitals:   03/18/17 2255 03/19/17 0010 03/19/17 0548 03/19/17 0937  BP: 132/89 (!) 134/96 117/87 (!) 147/89  Pulse: (!) 113 (!) 106 (!) 104 (!) 103  Resp:  19 18 19   Temp: 98.9 F (37.2 C) 99 F (37.2 C) 98.3 F (36.8 C) 98.3 F (36.8 C)  TempSrc: Oral Axillary Oral Oral  SpO2: 100% 99% 100% 100%  Weight:      Height:        General: Pt is alert, awake, not in acute distress Cardiovascular: RRR, S1/S2 +, no rubs, no gallops Respiratory: CTA bilaterally, no wheezing, no rhonchi Abdominal: Soft, NT, ND, bowel sounds + Extremities: no edema, no cyanosis   The results of significant diagnostics from this hospitalization (including imaging, microbiology, ancillary and laboratory) are listed below for reference.     Microbiology: Recent Results (from the past 240 hour(s))  CSF culture     Status: None (Preliminary result)   Collection Time: 03/17/17  2:39 PM   Result Value Ref Range Status   Specimen Description   Final    CSF Performed at [facility] [address], [place], [place] [address]    Special Requests   Final    NONE Performed at [facility], [place] [address], [place], [place] [address]    Gram Stain   Final    WBC PRESENT, PREDOMINANTLY MONONUCLEAR NO ORGANISMS SEEN CYTOSPIN SMEAR Gram Stain Report Called to,Read Back By and Verified With: CHEEK,K @ 2229 ON [id number] BY POTEAT,S Performed at [facility] [address] [place], [place] [address]    Culture   Final    NO GROWTH 1 DAY Performed at [facility], [place] [address], [place],  [address]    Report Status PENDING  Incomplete     Labs: BNP (last 3 results) No results for input(s): BNP in the last 8760 hours. Basic Metabolic Panel: Recent Labs  Lab 03/15/17 1954 03/17/17 0454  NA 139 138  K 3.8 3.7  CL 106 106  CO2 27 24  GLUCOSE 208* 174*  BUN 13 14  CREATININE 0.67 0.80  CALCIUM 9.5 9.4  MG 1.9 1.9   Liver Function Tests: Recent Labs  Lab 03/17/17 0454  AST 21  ALT 24  ALKPHOS 95  BILITOT 0.8  PROT 7.5  ALBUMIN 4.3   No results for input(s): LIPASE, AMYLASE in the last 168 hours. No results for input(s): AMMONIA in the last 168 hours. CBC: Recent Labs  Lab 03/15/17 1954 03/17/17 0454  WBC 9.5 10.7*  NEUTROABS 5.3 6.6  HGB 14.6 14.5  HCT 43.4 42.5  MCV 87.7 86.9  PLT 327 356   Cardiac Enzymes: Recent Labs  Lab 03/15/17 1954  CKTOTAL 77   BNP: Invalid input(s): POCBNP CBG: Recent Labs  Lab 03/18/17 1149 03/18/17 1631 03/18/17 2059 03/19/17  0715 03/19/17 1148  GLUCAP 188* 187* 196* 200* 248*   D-Dimer No results for input(s): DDIMER in the last 72 hours. Hgb A1c Recent Labs    03/19/17 0524  HGBA1C 7.6*   Lipid Profile No results for input(s): CHOL, HDL, LDLCALC, TRIG, CHOLHDL, LDLDIRECT in the last 72 hours. Thyroid function studies No  results for input(s): TSH, T4TOTAL, T3FREE, THYROIDAB in the last 72 hours.  Invalid input(s): FREET3 Anemia work up No results for input(s): VITAMINB12, FOLATE, FERRITIN, TIBC, IRON, RETICCTPCT in the last 72 hours. Urinalysis    Component Value Date/Time   COLORURINE YELLOW 04/18/2007 1240   APPEARANCEUR CLEAR 04/18/2007 1240   LABSPEC 1.023 04/18/2007 1240   PHURINE 5.5 04/18/2007 1240   GLUCOSEU NEGATIVE 04/18/2007 1240   HGBUR NEGATIVE 04/18/2007 1240   BILIRUBINUR NEGATIVE 04/18/2007 1240   KETONESUR NEGATIVE 04/18/2007 1240   PROTEINUR NEGATIVE 04/18/2007 1240   UROBILINOGEN 0.2 04/18/2007 1240   NITRITE NEGATIVE 04/18/2007 1240   LEUKOCYTESUR SMALL (A) 04/18/2007 1240   Sepsis Labs Invalid input(s): PROCALCITONIN,  WBC,  LACTICIDVEN Microbiology Recent Results (from the past 240 hour(s))  CSF culture     Status: None (Preliminary result)   Collection Time: 03/17/17  2:39 PM  Result Value Ref Range Status   Specimen Description   Final    CSF Performed at [facility], [place] [address], [place], [place] [address]    Special Requests   Final    NONE Performed at [facility], [place] [address], [place], [place] [address]    Gram Stain   Final    WBC PRESENT, PREDOMINANTLY MONONUCLEAR NO ORGANISMS SEEN CYTOSPIN SMEAR Gram Stain Report Called to,Read Back By and Verified With: CHEEK,K @ 8016 ON [id number] BY POTEAT,S Performed at [facility] [address] [place], [place] [address]    Culture   Final    NO GROWTH 1 DAY Performed at [facility], [place] [address], [place], [place] [address]    Report Status PENDING  Incomplete     SIGNED:   Marylu Lund, MD  [facility] 03/19/2017, 12:00 PM  If 7PM-7AM, please contact night-coverage [email or web address] Password [email or web address]

## 2017-03-19 NOTE — Progress Notes (Signed)
Patient ID: Kyle Novak, male   DOB: Nov 05, 1965, 52 y.o.   MRN: [id number] I will see Kyle Novak in the office. I have included my information on the discharge follow up.

## 2017-03-19 NOTE — Progress Notes (Signed)
Patient verbalized understanding of discharge instructions. Patient was given prescription and AVS. Patient is stable at discharge. 

## 2017-03-22 LAB — CSF CULTURE: CULTURE: NO GROWTH

## 2017-03-22 LAB — CSF CULTURE W GRAM STAIN

## 2017-03-23 LAB — OLIGOCLONAL BANDS, CSF + SERM

## 2017-03-25 LAB — B. BURGDORFI ANTIBODIES, CSF: LYME AB: 1.8 {index} — AB (ref <1.0)

## 2017-03-31 ENCOUNTER — Other Ambulatory Visit: Payer: Self-pay

## 2017-03-31 ENCOUNTER — Ambulatory Visit: Payer: BLUE CROSS/BLUE SHIELD | Attending: Internal Medicine | Admitting: Physical Therapy

## 2017-03-31 ENCOUNTER — Ambulatory Visit: Payer: BLUE CROSS/BLUE SHIELD | Admitting: Diagnostic Neuroimaging

## 2017-03-31 ENCOUNTER — Encounter: Payer: Self-pay | Admitting: Physical Therapy

## 2017-03-31 ENCOUNTER — Encounter: Payer: Self-pay | Admitting: Diagnostic Neuroimaging

## 2017-03-31 VITALS — BP 152/107 | HR 90 | Ht 68.0 in | Wt 172.2 lb

## 2017-03-31 DIAGNOSIS — G621 Alcoholic polyneuropathy: Secondary | ICD-10-CM

## 2017-03-31 DIAGNOSIS — G544 Lumbosacral root disorders, not elsewhere classified: Secondary | ICD-10-CM

## 2017-03-31 DIAGNOSIS — R2689 Other abnormalities of gait and mobility: Secondary | ICD-10-CM | POA: Diagnosis present

## 2017-03-31 DIAGNOSIS — E1142 Type 2 diabetes mellitus with diabetic polyneuropathy: Secondary | ICD-10-CM

## 2017-03-31 DIAGNOSIS — M21372 Foot drop, left foot: Secondary | ICD-10-CM

## 2017-03-31 DIAGNOSIS — M21371 Foot drop, right foot: Secondary | ICD-10-CM

## 2017-03-31 DIAGNOSIS — M6281 Muscle weakness (generalized): Secondary | ICD-10-CM | POA: Diagnosis not present

## 2017-03-31 MED ORDER — AMITRIPTYLINE HCL 25 MG PO TABS: 25.0000 mg | ORAL_TABLET | Freq: Every day | ORAL | 6 refills | Status: DC

## 2017-03-31 MED ORDER — GABAPENTIN 300 MG PO CAPS: 300.0000 mg | ORAL_CAPSULE | Freq: Two times a day (BID) | ORAL | 12 refills | Status: DC

## 2017-03-31 NOTE — Patient Instructions (Addendum)
Heel Raise: Bilateral (Standing)    Rise on balls of feet. Repeat __10__ times per set. Do _2-3_ sets per session. Do __1-2__ sessions per day.  http://orth.exer.us/38   Copyright  VHI. All rights reserved.  Gastroc / Heel Cord Stretch - On Step    Stand with heels over edge of stair. Holding rail, lower heels until stretch is felt in calf of legs. Repeat _2__ times. Do _3__ times per day. HOLD 30-45 secs  Copyright  VHI. All rights reserved.

## 2017-03-31 NOTE — Patient Instructions (Signed)
-   start gabapentin 300mg  at bedtime time x 1 week; then increase to 300mg  twice a day   - after 1 more week, may start amitriptyline 25mg  at bedtime  - check EMG / NCS (electrical nerve testing)  - continue diabetes treatment  - gradually reduce alcohol use; caution with withdrawal symptoms (tremor, heart racing, sweating)

## 2017-03-31 NOTE — Progress Notes (Signed)
[facility]  PATIENT: Kyle Novak DOB: 16-May-1965  REFERRING CLINICIAN: Ritta Slot, MD HISTORY FROM: patient, chart review REASON FOR VISIT: new consult    HISTORICAL  CHIEF COMPLAINT:  Chief Complaint  Patient presents with  . New Patient (Initial Visit)    HISTORY OF PRESENT ILLNESS:   52 year old male with history of diabetes, high blood pressure, alcohol abuse, here for evaluation of lower extremity numbness, weakness, pain.  January 2018 patient was diagnosed with diabetes and hemoglobin A1c was 15.  In December 2018 patient started to have shooting electrical pains in his knees, thighs and calves.  Over 4 weeks symptoms continue to worsen, leading to bilateral foot drop, numbness in his feet and legs, numbness up to his thighs.  Now patient having some numbness and tingling in his fingers and hands.  Patient was referred to me for consult.  Before he could come for this appointment patient was admitted to the hospital due to numbness, weakness in his legs.  MRI of the lumbar spine showed a small intrathecal cystic enhancing lesion, possibly a schwannoma or ependymoma.  MRI of the brain, cervical thoracic spine were otherwise unremarkable.  This was felt to be an incidental finding.  Lumbar puncture showed normal protein, slightly elevated glucose, and no white blood cells.  Patient was diagnosed with bilateral foot drop and neuropathy and recommended for outpatient follow-up.  Diabetes control has improved.  Patient is on insulin.  Last hemoglobin A1c was 7.6.  Patient has daily tobacco abuse, smoking 1 pack of cigarettes over 1-2 days.  Patient has almost daily alcohol use drinking 2-3 shots per day, sometimes more on the weekend.  Sometimes he goes 2-3 days without drinking.  This is been going on for at least 4-5 years.  He denies any withdrawal symptoms when he stops drinking.    REVIEW OF SYSTEMS: Full 14 system review of systems performed and  negative with exception of: Sleepiness restless legs numbness.  ALLERGIES: No Known Allergies  HOME MEDICATIONS: Outpatient Medications Prior to Visit  Medication Sig Dispense Refill  . cyclobenzaprine (FLEXERIL) 5 MG tablet Take 1 tablet (5 mg total) by mouth at bedtime as needed for muscle spasms. 30 tablet 0  . losartan-hydrochlorothiazide (HYZAAR) 100-12.5 MG tablet Take 1 tablet by mouth daily.  0  . Multiple Vitamin (MULITIVITAMIN WITH MINERALS) TABS Take 1 tablet by mouth daily.    . TRESIBA FLEXTOUCH 200 UNIT/ML SOPN Inject 1 each into the skin daily.  0   Facility-Administered Medications Prior to Visit  Medication Dose Route Frequency Provider Last Rate Last Dose  . acetaminophen (TYLENOL) tablet 650 mg  650 mg Oral Q4H PRN Lorriane Shire, MD        PAST MEDICAL HISTORY: Past Medical History:  Diagnosis Date  . Diabetes mellitus without complication ([place])   . Hypertension     PAST SURGICAL HISTORY: Past Surgical History:  Procedure Laterality Date  . NO PAST SURGERIES      FAMILY HISTORY: Family History  Problem Relation Age of Onset  . Lung cancer Father     SOCIAL HISTORY:  Social History   Socioeconomic History  . Marital status: Single    Spouse name: Not on file  . Number of children: Not on file  . Years of education: Not on file  . Highest education level: Not on file  Social Needs  . Financial resource strain: Not on file  . Food insecurity - worry: Not on file  . Food  insecurity - inability: Not on file  . Transportation needs - medical: Not on file  . Transportation needs - non-medical: Not on file  Occupational History  . Not on file  Tobacco Use  . Smoking status: Current Every Day Smoker    Types: Cigarettes  . Smokeless tobacco: Never Used  Substance and Sexual Activity  . Alcohol use: Yes    Comment: OCCASIONAL  . Drug use: No  . Sexual activity: Not on file  Other Topics Concern  . Not on file  Social History Narrative  .  Not on file     PHYSICAL EXAM  GENERAL EXAM/CONSTITUTIONAL: Vitals:  Vitals:   03/31/17 0950  BP: (!) 152/107  Pulse: 90  Weight: 172 lb 3.2 oz (78.1 kg)  Height: 5\' 8"  (1.727 m)     Body mass index is 26.18 kg/m.  Visual Acuity Screening   Right eye Left eye Both eyes  Without correction: 20/200 20/20   With correction:        Patient is in no distress; well developed, nourished and groomed; neck is supple  CARDIOVASCULAR:  Examination of carotid arteries is normal; no carotid bruits  Regular rate and rhythm, no murmurs  Examination of peripheral vascular system by observation and palpation is normal  EYES:  Ophthalmoscopic exam of optic discs and posterior segments is normal; no papilledema or hemorrhages  MUSCULOSKELETAL:  Gait, strength, tone, movements noted in Neurologic exam below  NEUROLOGIC: MENTAL STATUS:  No flowsheet data found.  awake, alert, oriented to person, place and time  recent and remote memory intact  normal attention and concentration  language fluent, comprehension intact, naming intact,   fund of knowledge appropriate  CRANIAL NERVE:   2nd - no papilledema on fundoscopic exam  2nd, 3rd, 4th, 6th - pupils equal and reactive to light, visual fields full to confrontation, extraocular muscles intact, no nystagmus  5th - facial sensation symmetric  7th - facial strength symmetric  8th - hearing intact  9th - palate elevates symmetrically, uvula midline  11th - shoulder shrug symmetric  12th - tongue protrusion midline  MOTOR:   normal bulk and tone, full strength in the BUE, BLE; EXCEPT BILATERAL FOOT DORSIFLEXION (RIGHT 2-3, LEFT 1-2) AND BILATERAL GREAT TOE EXTENSION (RIGHT 3, LEFT 0)  SENSORY:   normal and symmetric to light touch, pinprick, temperature, vibration; EXCEPT DECR PP AND TEMP IN FEET  COORDINATION:   finger-nose-finger, fine finger movements normal  REFLEXES:   deep tendon reflexes --> BUE 2,  KNEES 2, ANKLES 0  GAIT/STATION:   CAUTIOUS STEPPAGE GAIT; BILATERAL FOOT DROP    DIAGNOSTIC DATA (LABS, IMAGING, TESTING) - I reviewed patient records, labs, notes, testing and imaging myself where available.  Lab Results  Component Value Date   WBC 10.7 (H) 03/17/2017   HGB 14.5 03/17/2017   HCT 42.5 03/17/2017   MCV 86.9 03/17/2017   PLT 356 03/17/2017      Component Value Date/Time   NA 138 03/17/2017 0454   K 3.7 03/17/2017 0454   CL 106 03/17/2017 0454   CO2 24 03/17/2017 0454   GLUCOSE 174 (H) 03/17/2017 0454   BUN 14 03/17/2017 0454   CREATININE 0.80 03/17/2017 0454   CALCIUM 9.4 03/17/2017 0454   PROT 7.5 03/17/2017 0454   ALBUMIN 4.3 03/17/2017 0454   AST 21 03/17/2017 0454   ALT 24 03/17/2017 0454   ALKPHOS 95 03/17/2017 0454   BILITOT 0.8 03/17/2017 0454   GFRNONAA >60 03/17/2017 0454  GFRAA >60 03/17/2017 0454   No results found for: CHOL, HDL, LDLCALC, LDLDIRECT, TRIG, CHOLHDL  Hgb A1c MFr Bld  Date Value Ref Range Status  03/19/2017 7.6 (H) 4.8 - 5.6 % Final    Comment:    (NOTE) Pre diabetes:          5.7%-6.4% Diabetes:              >6.4% Glycemic control for   <7.0% adults with diabetes    Lab Results  Component Value Date   VITAMINB12 1,224 (H) 03/15/2017   No results found for: TSH  03/15/17 MRI lumbar spine (with and without) [I reviewed images myself and agree with interpretation. -VRP]  - Enhancing intradural midline 5 mm spherical lesion, adjacent to the filum terminale and multiple cauda equina nerve roots. 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 Short-term follow-up 3-6 months recommended with repeat lumbar MRI without and with contrast. In addition, elective, not urgent, neurosurgical consultation may be warranted. - L5-S1 central protrusion.  No impingement.  03/16/17 MRI brain (with and without) [I reviewed images myself and agree with  interpretation. -VRP]  - Normal MRI of the brain. No intracranial mass lesion or evidence for metastatic disease.  03/16/17 MRI cervical / thoracic spine (without) [I reviewed images myself and agree with interpretation. -VRP]  1. Intermittent cervical spine disc and endplate degeneration with no spinal stenosis. Normal cervical spinal cord. 2. Thoracic disc degeneration at T8-T9 and T9-T10. Small thoracic disc herniations at both levels not resulting in thoracic spinal stenosis. Normal thoracic spinal cord otherwise. 3. Intermittent mild cervical and thoracic neural foraminal stenosis, aside from moderate left thoracic neural foraminal stenosis at the T2 nerve level. 4. Note that this study was done without IV contrast, as Brain MRI without and with contrast is still pending at this time (scheduled for later today), and decision was made to follow-up with postcontrast cervical and/or spine imaging as necessary. But no cervical or thoracic spine lesion is identified for which IV contrast would be valuable.  03/17/17 CSF studies  - WBC 1-->1, RBC 1-->0, protein 41, glucose 84, OCB zero, HSV PCR negative, culture negative, B burgodorfi Ab 1.8 (but IgM was normal)     ASSESSMENT AND PLAN  52 y.o. year old male here with new onset pain, weakness, numbness in lower extremities starting in December 2018, with fairly rapid worsening symptoms over 2-4 weeks.  This could represent autoimmune or inflammatory peripheral neuropathy.  Also could be related to diabetic plexopathy.  Diabetic and alcoholic neuropathy are also possible.   Ddx: neuropathy (diabetic vs alcoholic) vs bilateral lumbosacral plexopathy (diabetic)  1. Diabetic polyneuropathy associated with type 2 diabetes mellitus ([place])   2. Alcoholic peripheral neuropathy ([place])   3. Bilateral foot-drop   4. Lesion of lumbosacral nerve root      PLAN:  NEUROPATHY PAIN - start gabapentin 300mg  at bedtime time x 1 week; then increase to 300mg   twice a day  - after 1 more week, may start amitriptyline 25mg  at bedtime  NEUROPATHY WORKUP - check EMG / NCS (electrical nerve testing; rule out demyelinating neuropathy)  NEUROPATHY TREATMENT - continue diabetes treatment - gradually reduce alcohol use; caution with withdrawal symptoms (tremor, heart racing, sweating)  LUMBAR SPINE LESION - follow up with neurosurgery re: lumbar root  lesion (likely incidental finding)   Orders Placed This Encounter  Procedures  . NCV with EMG(electromyography)   Meds  ordered this encounter  Medications  . gabapentin (NEURONTIN) 300 MG capsule    Sig: Take 1 capsule (300 mg total) by mouth 2 (two) times daily.    Dispense:  60 capsule    Refill:  12  . amitriptyline (ELAVIL) 25 MG tablet    Sig: Take 1 tablet (25 mg total) by mouth at bedtime.    Dispense:  30 tablet    Refill:  6   Return for for NCV/EMG.    Penni Bombard, MD 6/43/8377, 93:96 AM Certified in Neurology, Neurophysiology and Neuroimaging  [facility] [address], [place], [place] [address] [phone]

## 2017-04-01 ENCOUNTER — Other Ambulatory Visit: Payer: Self-pay

## 2017-04-01 NOTE — Therapy (Signed)
[facility] [address] [place], [place], [address] Phone: [phone]   Fax:  [phone]  Physical Therapy Evaluation  Patient Details  Name: Kyle Novak MRN: [id number] Date of Birth: 1966-01-23 Referring Provider: Dr. Marylu Novak   Encounter Date: 03/31/2017  PT End of Session - 04/01/17 1058    Visit Number  1    Number of Visits  1    Authorization Type  BCBS    Authorization - Number of Visits  30    PT Start Time  0850    PT Stop Time  0930    PT Time Calculation (min)  40 min       Past Medical History:  Diagnosis Date  . Diabetes mellitus without complication ([place])   . Hypertension     Past Surgical History:  Procedure Laterality Date  . NO PAST SURGERIES      There were no vitals filed for this visit.   Subjective Assessment - 04/01/17 1038    Subjective  Pt reports he has had foot drop for past 2-3 months; reports 3 falls since November; states tingling in bil. LE's has progessively worsened since November - now extends up entire legs;  pt states he has first appt with neurologist after PT evaluation today                                                                                                                                                                                    Pertinent History  spinal tumor diagnosed on 03-15-17; hospitalization 03-15-17 - 03-19-17 with worsening gait and difficulty lifting feet off ground     Patient Stated Goals  want to get leg musles stronger so he doesn't have to wear braces    Currently in Pain?  Yes    Pain Score  5     Pain Location  Foot    Pain Orientation  Right;Left    Pain Descriptors / Indicators  Tingling;Pins and needles;Throbbing;Numbness    Pain Onset  More than a month ago    Pain Frequency  Constant         [facility] PT Assessment - 04/01/17 0001      Assessment   Medical Diagnosis  Bil. foot drop due to  diabetic neuropathy    Referring Provider  Dr. Marylu Novak    Onset Date/Surgical Date  -- Nov. 2018      Precautions   Precautions  Fall    Required Braces or Orthoses  Other Brace/Splint pt has bilateral AFO's      Restrictions   Weight Bearing Restrictions  No      Balance  Screen   Has the patient fallen in the past 6 months  Yes    How many times?  3-4    Has the patient had a decrease in activity level because of a fear of falling?   Yes    Is the patient reluctant to leave their home because of a fear of falling?   No      Prior Function   Level of Independence  Independent    Vocation  Full time employment      ROM / Strength   AROM / PROM / Strength  Strength      Strength   Overall Strength Comments  Bil. LE strength is WNL's with exception of bil. dorsiflexors     Strength Assessment Site  Ankle    Right/Left Ankle  Right;Left    Right Ankle Dorsiflexion  2+/5    Right Ankle Plantar Flexion  4/5    Left Ankle Dorsiflexion  2-/5 1-2-/5    Left Ankle Plantar Flexion  -- NT due to pt's fear of Lt ankle turning- pt able to PF in si      Ambulation/Gait   Ambulation/Gait  Yes    Ambulation/Gait Assistance  6: Modified independent (Device/Increase time)    Ambulation Distance (Feet)  100 Feet    Assistive device  None    Gait Pattern  Decreased dorsiflexion - right;Decreased dorsiflexion - left    Ambulation Surface  Level;Indoor    Gait Comments  Pt wearing bil.  AFO's             Objective measurements completed on examination: See above findings.              PT Education - 04/01/17 1057    Education provided  Yes    Education Details  heel cord stretches and PF strengthening/AROM    Person(s) Educated  Patient;Spouse    Methods  Explanation;Demonstration;Handout    Comprehension  Verbalized understanding;Returned demonstration                  Plan - 04/01/17 1627    History and Personal Factors relevant to plan of care:  DM    Clinical  Presentation  Evolving    Clinical Decision Making  Moderate    Rehab Potential  Fair    PT Frequency  One time visit    PT Duration  -- eval only per pt request - pt is scheduled to see neurologist    PT Treatment/Interventions  Therapeutic exercise;Patient/family education    PT Next Visit Plan  N/A - eval only per pt request    PT Home Exercise Plan  heel cord stretching and ankle ROM and PF strengthening    Consulted and Agree with Plan of Care  Patient       Patient will benefit from skilled therapeutic intervention in order to improve the following deficits and impairments:  Abnormal gait, Decreased balance, Decreased strength  Visit Diagnosis: Muscle weakness (generalized) - Plan: PT plan of care cert/re-cert  Other abnormalities of gait and mobility - Plan: PT plan of care cert/re-cert     Problem List Patient Active Problem List   Diagnosis Date Noted  . 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 Diabetes mellitus without complication ([place])   . Hypertension     Kyle Novak, Kyle Novak, PT 04/01/2017, [facility] [address] [place], [place], [address] Phone: [phone]  Fax:  [phone]  Name: Kyle Novak MRN: [id number] Date of Birth: Sep 29, 1965

## 2017-04-02 ENCOUNTER — Ambulatory Visit: Payer: BLUE CROSS/BLUE SHIELD | Admitting: Physical Therapy

## 2017-04-08 ENCOUNTER — Ambulatory Visit (INDEPENDENT_AMBULATORY_CARE_PROVIDER_SITE_OTHER): Payer: BLUE CROSS/BLUE SHIELD | Admitting: Diagnostic Neuroimaging

## 2017-04-08 DIAGNOSIS — G621 Alcoholic polyneuropathy: Secondary | ICD-10-CM

## 2017-04-08 DIAGNOSIS — E1142 Type 2 diabetes mellitus with diabetic polyneuropathy: Secondary | ICD-10-CM | POA: Diagnosis not present

## 2017-04-08 DIAGNOSIS — M21371 Foot drop, right foot: Secondary | ICD-10-CM

## 2017-04-08 DIAGNOSIS — Z0289 Encounter for other administrative examinations: Secondary | ICD-10-CM

## 2017-04-08 DIAGNOSIS — M21372 Foot drop, left foot: Secondary | ICD-10-CM

## 2017-04-09 NOTE — Procedures (Signed)
[facility]  NCS (NERVE CONDUCTION STUDY) WITH EMG (ELECTROMYOGRAPHY) REPORT   STUDY DATE: 04/08/17 PATIENT NAME: Kyle Novak DOB: 09-02-1965 MRN: [id number]  ORDERING CLINICIAN: Andrey Spearman, MD   TECHNOLOGIST: Oneita Jolly ELECTROMYOGRAPHER: Earlean Polka. Chanise Habeck, MD  CLINICAL INFORMATION: 52 year old male with progressive lower extremity numbness, pain and weakness from Jan - Feb 2019. Evaluate for CIDP.  FINDINGS: NERVE CONDUCTION STUDY: Right median and ulnar motor responses are normal.  Right ulnar f wave latency is normal.  Right peroneal motor response is prolonged distal latency (7.4 ms) decreased amplitude, slow conduction velocity (33 and 35 m/s).  Left peroneal motor response could not be obtained.  Bilateral tibial motor responses have prolonged distal latencies, normal amplitudes, and slow conduction velocities (37 m/s on the right, 36 m/s on left).  Right radial, right median, right ulnar sensory responses are normal.  Bilateral sural sensory responses have prolonged peak latencies and normal amplitudes.  Right superficial peroneal sensory response is prolonged peak latency and decreased amplitude.  Left superficial peroneal sensory response could not be obtained.   NEEDLE ELECTROMYOGRAPHY: Needle examination of bilateral lower extremities shows 3+ positive sharp waves and fibrillation potentials in bilateral tibialis anterior muscles at rest.    No muscle activity noted in left tibialis anterior on exertion.  Decreased recruitment noted in the right tibialis anterior on exertion.   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  The bilateral peroneal nerves are significantly affected.    INTERPRETING PHYSICIAN:  Penni Bombard, MD Certified in Neurology, Neurophysiology and Neuroimaging  [facility] [address], [place], [place] [address] [phone]   [facility]    Nerve / Sites Muscle Latency Ref. Amplitude Ref. Rel Amp Segments Distance Velocity Ref. Area    ms ms mV mV %  cm m/s m/s mVms  R Median - APB     Wrist APB 3.7 ?4.4 7.8 ?4.0 100 Wrist - APB 7   28.0     Upper arm APB 8.2  7.4  95.7 Upper arm - Wrist 23 51 ?49 28.0  R Ulnar - ADM     Wrist ADM 2.7 ?3.3 11.6 ?6.0 100 Wrist - ADM 7   35.2     B.Elbow ADM 6.5  11.1  95.8 B.Elbow - Wrist 21 54 ?49 35.5     A.Elbow ADM 8.4  10.6  95.9 A.Elbow - B.Elbow 10 52 ?49 34.7         A.Elbow - Wrist      R Peroneal - EDB     Ankle EDB 7.4 ?6.5 0.2 ?2.0 100 Ankle - EDB 9   0.8     Fib head EDB 16.2  0.3  112 Fib head - Ankle 31 35 ?44 0.9     Pop fossa EDB 19.8  0.2  82.2 Pop fossa - Fib head 12 33 ?44 0.7         Pop fossa - Ankle      L Peroneal - EDB     Ankle EDB NR ?6.5 NR ?2.0 NR Ankle - EDB 9   NR     Fib head EDB NR  NR  NR Fib head - Ankle 31 NR ?44 NR         Pop fossa - Ankle      R Tibial - AH     Ankle AH 4.3 ?5.8 4.4 ?4.0 100 Ankle - AH 9  15.2     Pop fossa AH 14.2  4.1  93.1 Pop fossa - Ankle 37 37 ?41 12.5  L Tibial - AH     Ankle AH 4.8 ?5.8 9.4 ?4.0 100 Ankle - AH 9   23.0     Pop fossa AH 15.2  6.5  69.1 Pop fossa - Ankle 37 36 ?41 17.7                 SNC    Nerve / Sites Rec. Site Peak Lat Ref.  Amp Ref. Segments Distance Peak Diff Ref.    ms ms V V  cm ms ms  R Radial - Anatomical snuff box (Forearm)     Forearm Wrist 2.6 ?2.9 15 ?15 Forearm - Wrist 10    R Sural - Ankle (Calf)     Calf Ankle 5.1 ?4.4 7 ?6 Calf - Ankle 14    L Sural - Ankle (Calf)     Calf Ankle 4.7 ?4.4 5 ?6 Calf - Ankle 14    R Superficial peroneal - Ankle     Lat leg Ankle 5.8 ?4.4 2 ?6 Lat leg - Ankle 14    L Superficial peroneal - Ankle     Lat leg Ankle NR ?4.4 NR ?6 Lat leg - Ankle 14    R Median, Ulnar - Transcarpal comparison     Median Palm Wrist 2.4 ?2.2 51 ?35 Median Palm - Wrist 8       Ulnar Palm Wrist 2.1 ?2.2 15 ?12 Ulnar  Palm - Wrist 8          Median Palm - Ulnar Palm  0.3 ?0.4  R Median - Orthodromic (Dig II, Mid palm)     Dig II Wrist 3.3 ?3.4 22 ?10 Dig II - Wrist 13    R Ulnar - Orthodromic, (Dig V, Mid palm)     Dig V Wrist 2.6 ?3.1 6 ?5 Dig V - Wrist 44                       F  Wave    Nerve F Lat Ref.   ms ms  R Tibial - AH 57.8 ?56.0  R Ulnar - ADM 29.5 ?32.0  L Tibial - AH 59.2 ?56.0               EMG full       EMG Summary Table    Spontaneous MUAP Recruitment  Muscle IA Fib PSW Fasc Other Amp Dur. Poly Pattern  R. Lumbar paraspinals Normal None None None _______ Normal Normal Normal Normal  R. Gluteus medius Normal None None None _______ Normal Normal Normal Normal  R. Vastus medialis Normal None None None _______ Normal Normal Normal Normal  R. Tibialis anterior Normal 3+ 3+ None _______ Decreased Normal Normal Reduced  R. Gastrocnemius (Medial head) Normal None None None _______ Normal Normal Normal Normal  L. Tibialis anterior Normal 3+ 3+ None _______ Normal Normal Normal No Activity

## 2017-04-19 ENCOUNTER — Telehealth: Payer: Self-pay | Admitting: Diagnostic Neuroimaging

## 2017-04-19 NOTE — Telephone Encounter (Signed)
Spoke to pt and he stated that the gabapentin the first 2 days he may have had some slight relief.  He states that the gabapentin makes him drowsiness so during the day he does not use.   The amitriptyline and gabapentin at night does not help.  Please advise.  Works with machines, standing.

## 2017-04-19 NOTE — Telephone Encounter (Signed)
Pt called gabapentin (NEURONTIN) 300 MG capsule is not helping. He is at work, legs are throbbing. Pt states he does not take the medication in the mornings when he is working, it makes him sleepy. He is having trouble sleeping at night due to the pain very bad at night. Please call to advise.

## 2017-04-20 NOTE — Telephone Encounter (Signed)
May increase amitriptyline to 50mg  at bedtime; and / or increase gabapentin to 600mg  at bedtime. -VRP

## 2017-04-20 NOTE — Telephone Encounter (Signed)
Pt called back today, he is not working, he is in severe pain. He is aware Rn is waiting on providers recommendations.

## 2017-04-20 NOTE — Telephone Encounter (Signed)
Spoke to pt and relayed that he needs to try the regimen of taking gabapetin 600mg  po one hr prior to bedtime and the amitriptyline 50mg  po qhs and see how he does.  Neuropathy worse due to stopped drinking, this may have intensified pain.  He verbalized understanding.

## 2017-04-20 NOTE — Telephone Encounter (Signed)
See other note

## 2017-05-10 MED ORDER — GABAPENTIN 300 MG PO CAPS: ORAL_CAPSULE | ORAL | 6 refills | Status: DC

## 2017-05-10 MED ORDER — AMITRIPTYLINE HCL 25 MG PO TABS: 50.0000 mg | ORAL_TABLET | Freq: Every day | ORAL | 6 refills | Status: DC

## 2017-05-10 NOTE — Addendum Note (Signed)
Addended by: Brandon Melnick on: 05/10/2017 03:42 PM   Modules accepted: Orders

## 2017-05-10 NOTE — Telephone Encounter (Signed)
Pt called back, he is out of the amitriptyline and would like refill sent today please.

## 2017-05-10 NOTE — Telephone Encounter (Addendum)
Patients states since increasing dosages of gabapentin (NEURONTIN) 300 MG capsule and amitriptyline (ELAVIL) 25 MG tablet he needs new Rx's called to [facility].

## 2017-05-10 NOTE — Telephone Encounter (Signed)
Done

## 2017-06-25 ENCOUNTER — Encounter (HOSPITAL_COMMUNITY): Payer: Self-pay

## 2017-06-25 ENCOUNTER — Emergency Department (HOSPITAL_COMMUNITY)
Admission: EM | Admit: 2017-06-25 | Discharge: 2017-06-25 | Disposition: A | Discharge status: Home / Self Care | Payer: Medicaid Other | Attending: Emergency Medicine | Admitting: Emergency Medicine

## 2017-06-25 DIAGNOSIS — E119 Type 2 diabetes mellitus without complications: Secondary | ICD-10-CM | POA: Insufficient documentation

## 2017-06-25 DIAGNOSIS — Z7722 Contact with and (suspected) exposure to environmental tobacco smoke (acute) (chronic): Secondary | ICD-10-CM | POA: Insufficient documentation

## 2017-06-25 DIAGNOSIS — R200 Anesthesia of skin: Secondary | ICD-10-CM | POA: Diagnosis present

## 2017-06-25 DIAGNOSIS — I10 Essential (primary) hypertension: Secondary | ICD-10-CM | POA: Insufficient documentation

## 2017-06-25 DIAGNOSIS — G629 Polyneuropathy, unspecified: Secondary | ICD-10-CM

## 2017-06-25 DIAGNOSIS — Z794 Long term (current) use of insulin: Secondary | ICD-10-CM | POA: Diagnosis not present

## 2017-06-25 DIAGNOSIS — Z79899 Other long term (current) drug therapy: Secondary | ICD-10-CM | POA: Insufficient documentation

## 2017-06-25 LAB — CBC
HCT: 44.1 % (ref 39.0–52.0)
Hemoglobin: 14.8 g/dL (ref 13.0–17.0)
MCH: 28.8 pg (ref 26.0–34.0)
MCHC: 33.6 g/dL (ref 30.0–36.0)
MCV: 85.8 fL (ref 78.0–100.0)
Platelets: 320 10*3/uL (ref 150–400)
RBC: 5.14 MIL/uL (ref 4.22–5.81)
RDW: 12.6 % (ref 11.5–15.5)
WBC: 10.2 10*3/uL (ref 4.0–10.5)

## 2017-06-25 LAB — BASIC METABOLIC PANEL
Anion gap: 9 (ref 5–15)
BUN: 12 mg/dL (ref 6–20)
CO2: 26 mmol/L (ref 22–32)
Calcium: 9.6 mg/dL (ref 8.9–10.3)
Chloride: 103 mmol/L (ref 101–111)
Creatinine, Ser: 0.79 mg/dL (ref 0.61–1.24)
GFR calc Af Amer: >60 mL/min (ref >60)
GFR calc non Af Amer: >60 mL/min (ref >60)
Glucose, Bld: 263 mg/dL — ABNORMAL HIGH (ref 65–99)
Potassium: 4.1 mmol/L (ref 3.5–5.1)
Sodium: 138 mmol/L (ref 135–145)

## 2017-06-25 MED ORDER — OXYCODONE-ACETAMINOPHEN 5-325 MG PO TABS: 2.0000 | ORAL_TABLET | Freq: Three times a day (TID) | ORAL | 0 refills | Status: DC | PRN

## 2017-06-25 NOTE — ED Notes (Signed)
Pt able to bare weight on legs and transfer from wheelchair to stretcher without difficulty

## 2017-06-25 NOTE — ED Triage Notes (Signed)
Pt presents for evaluation of numbness/tingling to thumbs bilaterally and legs bilaterally. Pt states he is having difficulty walking. States that the numbness started about 7 months ago. States has been taking amitriptyline and gabapentin daily but endorses taking more than prescribed. States pain in lower back, had MRI in January.

## 2017-06-25 NOTE — ED Notes (Signed)
ED Provider at bedside. 

## 2017-06-30 NOTE — ED Provider Notes (Signed)
[place] [facility] EMERGENCY DEPARTMENT Provider Note   CSN: [id number] Arrival date & time: 06/25/17  1119     History   Chief Complaint Chief Complaint  Patient presents with  . Numbness    HPI Kyle Novak is a 52 y.o. male.  HPI   52 year old male with persistent neuropathy.  He describes ongoing numbness/tingling in bilateral lower extremities from feet to his thighs.  Pins-and-needles sensation.  More painful at times.  He is also been having similar sensation in his hands/forearms.  Worse in his thumbs.  Is a history of diabetes and long-standing alcohol abuse although he is now sober.  He has been evaluated for these complaints previously and is on amitriptyline and gabapentin.  He reports compliance with his medications.  He has been evaluated by neurology for the same complaints.  Is presenting today because he feels like is not getting much improvement with his medications.  Past Medical History:  Diagnosis Date  . Diabetes mellitus without complication ([place])   . Hypertension     Patient Active Problem List   Diagnosis Date Noted  . 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 Spinal cord mass ([place]) 03/16/2017  . Diabetes mellitus without complication ([place])   . Hypertension     Past Surgical History:  Procedure Laterality Date  . NO PAST SURGERIES          Home Medications    Prior to Admission medications   Medication Sig Start Date End Date Taking? Authorizing Provider  amitriptyline (ELAVIL) 25 MG tablet Take 2 tablets (50 mg total) by mouth at bedtime. 05/10/17   Penumalli, Earlean Polka, MD  cyclobenzaprine (FLEXERIL) 5 MG tablet Take 1 tablet (5 mg total) by mouth at bedtime as needed for muscle spasms. 03/19/17   Donne Hazel, MD  gabapentin (NEURONTIN) 300 MG capsule Take 1 cap in AM and 2 caps in PM (one hour prior to bedtime). 05/10/17   Penumalli, Earlean Polka, MD  losartan-hydrochlorothiazide (HYZAAR) 100-12.5 MG tablet Take 1 tablet by mouth daily.  01/22/17   [provider]  Multiple Vitamin (MULITIVITAMIN WITH MINERALS) TABS Take 1 tablet by mouth daily.    [provider]  oxyCODONE-acetaminophen (PERCOCET/ROXICET) 5-325 MG tablet Take 2 tablets by mouth every 8 (eight) hours as needed for severe pain. 06/25/17   Virgel Manifold, MD  TRESIBA FLEXTOUCH 200 UNIT/ML SOPN Inject 1 each into the skin daily. 01/22/17   [provider]    Family History Family History  Problem Relation Age of Onset  . Lung cancer Father     Social History Social History   Tobacco Use  . Smoking status: Current Every Day Smoker    Types: Cigarettes  . Smokeless tobacco: Never Used  Substance Use Topics  . Alcohol use: Yes    Comment: OCCASIONAL  . Drug use: No     Allergies   Patient has no known allergies.   Review of Systems Review of Systems   Physical Exam Updated Vital Signs BP (!) 141/115 (BP Location: Right Arm)   Pulse 99   Temp 99 F (37.2 C) (Oral)   Resp 18   Ht 5' 8.5" (1.74 m)   Wt 73.5 kg (162 lb)   SpO2 100%   BMI 24.27 kg/m   Physical Exam  Constitutional: He appears well-developed and well-nourished. No distress.  HENT:  Head: Normocephalic and atraumatic.  Eyes: Conjunctivae are normal. Right eye exhibits no discharge. Left eye exhibits no discharge.  Neck:  Neck supple.  Cardiovascular: Normal rate, regular rhythm and normal heart sounds. Exam reveals no gallop and no friction rub.  No murmur heard. Pulmonary/Chest: Effort normal and breath sounds normal. No respiratory distress.  Abdominal: Soft. He exhibits no distension. There is no tenderness.  Musculoskeletal: He exhibits no edema or tenderness.  Neurological: He is alert. No cranial nerve deficit. He exhibits normal muscle tone. Coordination normal.  Skin: Skin is warm and dry.  Psychiatric: He has a normal mood and affect. His behavior is normal. Thought content normal.  Nursing note and vitals reviewed.    ED Treatments  / Results  Labs (all labs ordered are listed, but only abnormal results are displayed) Labs Reviewed  BASIC METABOLIC PANEL - Abnormal; Notable for the following components:      Result Value   Glucose, Bld 263 (*)    All other components within normal limits  CBC    EKG None  Radiology No results found.  Procedures Procedures (including critical care time)  Medications Ordered in ED Medications - No data to display   Initial Impression / Assessment and Plan / ED Course  I have reviewed the triage vital signs and the nursing notes.  Pertinent labs & imaging results that were available during my care of the patient were reviewed by me and considered in my medical decision making (see chart for details).     52 year old male with symptoms consistent with peripheral neuropathy.  Likely related to long history of alcohol abuse although currently sober.  He also has a past history of diabetes.  Is currently on gabapentin and amitriptyline.  Unfortunately, there is not much more I can add from emergency medicine standpoint for his symptoms.  We will give a short course of pain medicine for a couple days but advised that he feels like he needs treatment beyond what he is currently getting that he needs to discuss further with his neurologist.  He may ultimately benefit from pain management.  Final Clinical Impressions(s) / ED Diagnoses   Final diagnoses:  Polyneuropathy    ED Discharge Orders        Ordered    oxyCODONE-acetaminophen (PERCOCET/ROXICET) 5-325 MG tablet  Every 8 hours PRN     06/25/17 1520       Virgel Manifold, MD 06/30/17 1220

## 2017-07-16 ENCOUNTER — Other Ambulatory Visit: Payer: Self-pay

## 2017-07-16 ENCOUNTER — Encounter (HOSPITAL_COMMUNITY): Payer: Self-pay | Admitting: Emergency Medicine

## 2017-07-16 ENCOUNTER — Emergency Department (HOSPITAL_COMMUNITY)
Admission: EM | Admit: 2017-07-16 | Discharge: 2017-07-16 | Disposition: A | Discharge status: Home / Self Care | Payer: Medicaid Other | Attending: Emergency Medicine | Admitting: Emergency Medicine

## 2017-07-16 DIAGNOSIS — M79604 Pain in right leg: Secondary | ICD-10-CM | POA: Insufficient documentation

## 2017-07-16 DIAGNOSIS — M79605 Pain in left leg: Secondary | ICD-10-CM | POA: Insufficient documentation

## 2017-07-16 DIAGNOSIS — Z5321 Procedure and treatment not carried out due to patient leaving prior to being seen by health care provider: Secondary | ICD-10-CM | POA: Diagnosis not present

## 2017-07-16 NOTE — ED Triage Notes (Signed)
Pt to ER reports bilateral leg pain related to his neuropathy. Pt ambulatory. Reports pain radiates from feet to lower back. Strength intact.

## 2017-07-16 NOTE — ED Notes (Signed)
Reports compliance with amitriptyline and gabapentin, last dose last night -states no relief.

## 2017-07-19 NOTE — ED Notes (Signed)
Pt left ed and went to pcp on 07/16/17   Follow up call made  0915  06/03 19 s Anuj Summons rn

## 2017-08-27 ENCOUNTER — Encounter (HOSPITAL_COMMUNITY): Payer: Self-pay | Admitting: Emergency Medicine

## 2017-08-27 ENCOUNTER — Emergency Department (HOSPITAL_COMMUNITY)
Admission: EM | Admit: 2017-08-27 | Discharge: 2017-08-27 | Disposition: A | Discharge status: Home / Self Care | Payer: Medicaid Other | Attending: Emergency Medicine | Admitting: Emergency Medicine

## 2017-08-27 ENCOUNTER — Emergency Department (HOSPITAL_COMMUNITY): Payer: Medicaid Other

## 2017-08-27 DIAGNOSIS — S8391XA Sprain of unspecified site of right knee, initial encounter: Secondary | ICD-10-CM | POA: Diagnosis not present

## 2017-08-27 DIAGNOSIS — E119 Type 2 diabetes mellitus without complications: Secondary | ICD-10-CM | POA: Diagnosis not present

## 2017-08-27 DIAGNOSIS — M25561 Pain in right knee: Secondary | ICD-10-CM

## 2017-08-27 DIAGNOSIS — F17210 Nicotine dependence, cigarettes, uncomplicated: Secondary | ICD-10-CM | POA: Diagnosis not present

## 2017-08-27 DIAGNOSIS — Y939 Activity, unspecified: Secondary | ICD-10-CM | POA: Insufficient documentation

## 2017-08-27 DIAGNOSIS — Y929 Unspecified place or not applicable: Secondary | ICD-10-CM | POA: Diagnosis not present

## 2017-08-27 DIAGNOSIS — Z79899 Other long term (current) drug therapy: Secondary | ICD-10-CM | POA: Diagnosis not present

## 2017-08-27 DIAGNOSIS — Y999 Unspecified external cause status: Secondary | ICD-10-CM | POA: Diagnosis not present

## 2017-08-27 DIAGNOSIS — I10 Essential (primary) hypertension: Secondary | ICD-10-CM | POA: Diagnosis not present

## 2017-08-27 DIAGNOSIS — S8991XA Unspecified injury of right lower leg, initial encounter: Secondary | ICD-10-CM | POA: Diagnosis present

## 2017-08-27 DIAGNOSIS — W19XXXA Unspecified fall, initial encounter: Secondary | ICD-10-CM | POA: Diagnosis not present

## 2017-08-27 DIAGNOSIS — S838X1A Sprain of other specified parts of right knee, initial encounter: Secondary | ICD-10-CM

## 2017-08-27 MED ORDER — HYDROCODONE-ACETAMINOPHEN 5-325 MG PO TABS: 2.0000 | ORAL_TABLET | ORAL | 0 refills | Status: DC | PRN

## 2017-08-27 NOTE — ED Notes (Signed)
PA made aware of HR at d/c.

## 2017-08-27 NOTE — Discharge Instructions (Addendum)
Return if any problems.

## 2017-08-27 NOTE — ED Triage Notes (Signed)
Patient reports fall x2 days ago. C/o right knee pain. Hx neuropathy. Ambulatory with cane.

## 2017-08-29 NOTE — ED Provider Notes (Signed)
[facility] DEPT Provider Note   CSN: [id number] Arrival date & time: 08/27/17  1918     History   Chief Complaint Chief Complaint  Patient presents with  . Knee Pain    HPI Kyle Novak is a 52 y.o. male.  The history is provided by the patient. No language interpreter was used.  Knee Pain   This is a new problem. The problem occurs constantly. The pain is present in the right knee. The pain is at a severity of 6/10. The pain is moderate. He has tried nothing for the symptoms. The treatment provided no relief.  Pt fell and hit his knee.   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  Pt is suppose to have a 6 month repeat MRi.  That would be this month.    Past Medical History:  Diagnosis Date  . Diabetes mellitus without complication ([place])   . Hypertension     Patient Active Problem List   Diagnosis Date Noted  . 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 Spinal cord mass ([place]) 03/16/2017  . Diabetes mellitus without complication ([place])   . Hypertension     Past Surgical History:  Procedure Laterality Date  . NO PAST SURGERIES          Home Medications    Prior to Admission medications   Medication Sig Start Date End Date Taking? Authorizing Provider  AMLODIPINE BESYLATE PO Take 1 tablet by mouth daily.   Yes [provider]  ibuprofen (ADVIL,MOTRIN) 200 MG tablet Take 200 mg by mouth every 6 (six) hours as needed for moderate pain.   Yes [provider]  Menthol, Topical Analgesic, (ICY HOT EX) Apply 1 application topically daily as needed (pain).   Yes [provider]  Multiple Vitamin (MULITIVITAMIN WITH MINERALS) TABS Take 1 tablet by mouth daily.   Yes [provider]  TRESIBA FLEXTOUCH 200 UNIT/ML SOPN Inject 1 each into the skin daily. 01/22/17  Yes [provider]  HYDROcodone-acetaminophen (NORCO/VICODIN) 5-325  MG tablet Take 2 tablets by mouth every 4 (four) hours as needed. 08/27/17   Fransico Meadow, PA-C    Family History Family History  Problem Relation Age of Onset  . Lung cancer Father     Social History Social History   Tobacco Use  . Smoking status: Current Every Day Smoker    Types: Cigarettes  . Smokeless tobacco: Never Used  Substance Use Topics  . Alcohol use: Yes    Comment: OCCASIONAL  . Drug use: No     Allergies   Patient has no known allergies.   Review of Systems Review of Systems  All other systems reviewed and are negative.    Physical Exam Updated Vital Signs BP (!) 154/112 (BP Location: Left Arm)   Pulse (!) 116   Temp 98.7 F (37.1 C) (Oral)   Resp 15   SpO2 97%   Physical Exam  Constitutional: He is oriented to person, place, and time. He appears well-developed and well-nourished.  HENT:  Head: Normocephalic.  Eyes: EOM are normal.  Neck: Normal range of motion.  Pulmonary/Chest: Effort normal.  Abdominal: He exhibits no distension.  Musculoskeletal: Normal range of motion. He exhibits tenderness.  Tender right knee,  Small effusion nv and ns intact  Neurological: He is alert and oriented to person, place, and time.  Psychiatric: He  has a normal mood and affect.  Nursing note and vitals reviewed.    ED Treatments / Results  Labs (all labs ordered are listed, but only abnormal results are displayed) Labs Reviewed - No data to display  EKG None  Radiology Dg Knee Complete 4 Views Right  Result Date: 08/27/2017 CLINICAL DATA:  Patient reports fall x2 days ago. C/o right knee pain. Hx neuropathy. Ambulatory with cane. EXAM: RIGHT KNEE - COMPLETE 4+ VIEW COMPARISON:  None. FINDINGS: No evidence of fracture, dislocation, or joint effusion. No evidence of arthropathy or other focal bone abnormality. Soft tissues are unremarkable. IMPRESSION: Negative. Electronically Signed   By: Lajean Manes M.D.   On: 08/27/2017 21:15     Procedures Procedures (including critical care time)  Medications Ordered in ED Medications - No data to display   Initial Impression / Assessment and Plan / ED Course  I have reviewed the triage vital signs and the nursing notes.  Pertinent labs & imaging results that were available during my care of the patient were reviewed by me and considered in my medical decision making (see chart for details).     Pt placed in a knee sleeve.  He is advised to follow up with his MD for recheck of knee or Orthopaedsit.  Pt needs to recheck with his Neurologist  Final Clinical Impressions(s) / ED Diagnoses   Final diagnoses:  Acute pain of right knee  Sprain of other ligament of right knee, initial encounter    ED Discharge Orders        Ordered    HYDROcodone-acetaminophen (NORCO/VICODIN) 5-325 MG tablet  Every 4 hours PRN     08/27/17 2133    An After Visit Summary was printed and given to the patient.   Fransico Meadow, [place] 08/29/17 1123    Daleen Bo, MD 08/29/17 [phone]

## 2017-09-09 DIAGNOSIS — Z0271 Encounter for disability determination: Secondary | ICD-10-CM

## 2017-09-16 ENCOUNTER — Emergency Department (HOSPITAL_COMMUNITY)
Admission: EM | Admit: 2017-09-16 | Discharge: 2017-09-16 | Disposition: A | Discharge status: Home / Self Care | Payer: BLUE CROSS/BLUE SHIELD | Attending: Emergency Medicine | Admitting: Emergency Medicine

## 2017-09-16 ENCOUNTER — Other Ambulatory Visit: Payer: Self-pay

## 2017-09-16 ENCOUNTER — Encounter (HOSPITAL_COMMUNITY): Payer: Self-pay

## 2017-09-16 DIAGNOSIS — E119 Type 2 diabetes mellitus without complications: Secondary | ICD-10-CM | POA: Insufficient documentation

## 2017-09-16 DIAGNOSIS — Z794 Long term (current) use of insulin: Secondary | ICD-10-CM | POA: Insufficient documentation

## 2017-09-16 DIAGNOSIS — Z79899 Other long term (current) drug therapy: Secondary | ICD-10-CM | POA: Insufficient documentation

## 2017-09-16 DIAGNOSIS — S8991XD Unspecified injury of right lower leg, subsequent encounter: Secondary | ICD-10-CM | POA: Diagnosis not present

## 2017-09-16 DIAGNOSIS — F17210 Nicotine dependence, cigarettes, uncomplicated: Secondary | ICD-10-CM | POA: Diagnosis not present

## 2017-09-16 DIAGNOSIS — W19XXXA Unspecified fall, initial encounter: Secondary | ICD-10-CM | POA: Insufficient documentation

## 2017-09-16 DIAGNOSIS — I10 Essential (primary) hypertension: Secondary | ICD-10-CM | POA: Insufficient documentation

## 2017-09-16 MED ORDER — ACETAMINOPHEN 500 MG PO TABS
1000.0000 mg | ORAL_TABLET | Freq: Once | ORAL | Status: AC
  Administered 2017-09-16: 1000 mg via ORAL
  Filled 2017-09-16: qty 2

## 2017-09-16 MED ORDER — IBUPROFEN 200 MG PO TABS
400.0000 mg | ORAL_TABLET | Freq: Once | ORAL | Status: AC
  Administered 2017-09-16: 400 mg via ORAL
  Filled 2017-09-16: qty 2

## 2017-09-16 MED ORDER — AMLODIPINE BESYLATE 5 MG PO TABS
10.0000 mg | ORAL_TABLET | Freq: Once | ORAL | Status: AC
  Administered 2017-09-16: 10 mg via ORAL
  Filled 2017-09-16: qty 2

## 2017-09-16 NOTE — ED Triage Notes (Signed)
Pt reports fall approximately a month ago. Pt was seen shortly thereafter, and told to keep off it. Pt states that his pain has just gotten worse and right leg has become more swollen

## 2017-09-16 NOTE — ED Provider Notes (Signed)
[facility] DEPT Provider Note   CSN: [id number] Arrival date & time: 09/16/17  0845     History   Chief Complaint Chief Complaint  Patient presents with  . Leg Pain    HPI Kyle Novak is a 52 y.o. male.  Patient s/p fall approximately 2 weeks ago with twisting/turning injury to right knee. Pain/swelling to knee has persisted since. Was seen in ED previously a couple days post injury, had xrays, no fx. No f/u since. Denies any reinjury or knee injury. Is ambulatory however w pain to knee. Pain dull, moderate, worse w movement knee or wt bearing. No erythema. No fever or chills. No hip or ankle pain. No new numbness/weakness. No leg swelling - pain/swelling localized to anterior knee.   The history is provided by the patient.  Leg Pain   Pertinent negatives include no numbness.    Past Medical History:  Diagnosis Date  . Diabetes mellitus without complication ([place])   . Hypertension     Patient Active Problem List   Diagnosis Date Noted  . 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 Spinal cord mass ([place]) 03/16/2017  . Diabetes mellitus without complication ([place])   . Hypertension     Past Surgical History:  Procedure Laterality Date  . NO PAST SURGERIES          Home Medications    Prior to Admission medications   Medication Sig Start Date End Date Taking? Authorizing Provider  AMLODIPINE BESYLATE PO Take 1 tablet by mouth daily.    [provider]  HYDROcodone-acetaminophen (NORCO/VICODIN) 5-325 MG tablet Take 2 tablets by mouth every 4 (four) hours as needed. 08/27/17   Fransico Meadow, PA-C  ibuprofen (ADVIL,MOTRIN) 200 MG tablet Take 200 mg by mouth every 6 (six) hours as needed for moderate pain.    [provider]  Menthol, Topical Analgesic, (ICY HOT EX) Apply 1 application topically daily as needed (pain).    [provider]  Multiple Vitamin (MULITIVITAMIN WITH MINERALS) TABS Take 1 tablet by mouth daily.     [provider]  TRESIBA FLEXTOUCH 200 UNIT/ML SOPN Inject 1 each into the skin daily. 01/22/17   [provider]    Family History Family History  Problem Relation Age of Onset  . Lung cancer Father     Social History Social History   Tobacco Use  . Smoking status: Current Every Day Smoker    Types: Cigarettes  . Smokeless tobacco: Never Used  Substance Use Topics  . Alcohol use: Yes    Comment: OCCASIONAL  . Drug use: No     Allergies   Patient has no known allergies.   Review of Systems Review of Systems  Constitutional: Negative for fever.  Cardiovascular: Negative for leg swelling.  Skin: Negative for rash and wound.  Neurological: Negative for weakness and numbness.     Physical Exam Updated Vital Signs BP (!) 179/114 (BP Location: Right Arm) Comment: per pt, did not take bp meds this am  Pulse (!) 117   Temp 98.6 F (37 C)   Resp 14   Wt 73.5 kg (162 lb)   SpO2 100%   BMI 24.27 kg/m   Physical Exam  Constitutional: He appears well-developed and well-nourished. No distress.  HENT:  Head: Atraumatic.  Eyes: Conjunctivae are normal.  Neck: Neck supple. No tracheal deviation present.  Cardiovascular: Normal rate and intact distal pulses.  Pulmonary/Chest: Effort normal. No accessory muscle usage. No respiratory distress.  Musculoskeletal:  Mild swelling anterior to right knee. No large effusion noted. Knee grossly stable. Distal pulses palp. No lower leg swelling/pain/tenderness. No pain w rom at hip or ankle. No pain w passive rom at knee. No erythema, increased warmth or other sign of infection.   Neurological: He is alert.  Skin: Skin is warm and dry. No rash noted.  Psychiatric: He has a normal mood and affect.  Nursing note and vitals reviewed.    ED Treatments / Results  Labs (all labs ordered are listed, but only abnormal results are displayed) Labs Reviewed - No data to display  EKG None  Radiology No results  found.  Procedures Procedures (including critical care time)  Medications Ordered in ED Medications  ibuprofen (ADVIL,MOTRIN) tablet 400 mg (has no administration in time range)  acetaminophen (TYLENOL) tablet 1,000 mg (has no administration in time range)     Initial Impression / Assessment and Plan / ED Course  I have reviewed the triage vital signs and the nursing notes.  Pertinent labs & imaging results that were available during my care of the patient were reviewed by me and considered in my medical decision making (see chart for details).  Reviewed nursing notes and prior charts for additional history. Reviewed prior films, degenerative changes, no fx.   Discussed with pt possible quadriceps injury, possible other muscular, ligamentous, meniscal injury.  In room, hr 88, rr 14. bp is high - pt indicates has bp meds at home, but has not yet taken today.  Acetaminophen and motrin po. Knee immobilizer.   Discussed outpt mri and ortho f/u.  Also rec close pcp f/u re bp.   Pt given a dose of his bp med.   Pt currently appears stable for d/c.     Final Clinical Impressions(s) / ED Diagnoses   Final diagnoses:  None    ED Discharge Orders    None       Lajean Saver, MD 09/16/17 [phone]

## 2017-09-16 NOTE — Discharge Instructions (Signed)
It was our pleasure to provide your ER care today - we hope that you feel better.  We are concerned you may have a muscular/quadriceps, ligamentous, or meniscal injury to your knee.  Follow up with outpatient MRI - we have made referral for that test.   Also follow up with orthopedist in the next 1-2 weeks - see referral - call office to arrange appointment.   Take acetaminophen and/or ibuprofen as need for pain. Use knee immobilizer/brace as need.   Also, your blood pressure is high today - take your medication as prescribed, limit salt intake, and follow up with primary care doctor in the coming week for recheck.   Return to ER if worse, new symptoms, fevers, severe leg swelling, other concern.

## 2017-09-27 ENCOUNTER — Other Ambulatory Visit: Payer: Self-pay

## 2017-09-27 ENCOUNTER — Emergency Department (HOSPITAL_COMMUNITY)
Admission: EM | Admit: 2017-09-27 | Discharge: 2017-09-27 | Disposition: A | Discharge status: Home / Self Care | Payer: BLUE CROSS/BLUE SHIELD | Attending: Emergency Medicine | Admitting: Emergency Medicine

## 2017-09-27 DIAGNOSIS — M25569 Pain in unspecified knee: Secondary | ICD-10-CM | POA: Diagnosis not present

## 2017-09-27 DIAGNOSIS — Z5321 Procedure and treatment not carried out due to patient leaving prior to being seen by health care provider: Secondary | ICD-10-CM | POA: Diagnosis not present

## 2017-10-11 ENCOUNTER — Other Ambulatory Visit: Payer: Self-pay

## 2017-10-11 ENCOUNTER — Encounter

## 2017-10-11 ENCOUNTER — Encounter (INDEPENDENT_AMBULATORY_CARE_PROVIDER_SITE_OTHER): Payer: Self-pay | Admitting: Physician Assistant

## 2017-10-11 ENCOUNTER — Ambulatory Visit (INDEPENDENT_AMBULATORY_CARE_PROVIDER_SITE_OTHER): Payer: Self-pay | Admitting: Physician Assistant

## 2017-10-11 VITALS — BP 161/114 | HR 131 | Temp 98.3°F | Ht 68.5 in | Wt 148.2 lb

## 2017-10-11 DIAGNOSIS — M21372 Foot drop, left foot: Secondary | ICD-10-CM

## 2017-10-11 DIAGNOSIS — M25561 Pain in right knee: Secondary | ICD-10-CM

## 2017-10-11 DIAGNOSIS — G8929 Other chronic pain: Secondary | ICD-10-CM

## 2017-10-11 DIAGNOSIS — E1142 Type 2 diabetes mellitus with diabetic polyneuropathy: Secondary | ICD-10-CM

## 2017-10-11 DIAGNOSIS — M21371 Foot drop, right foot: Secondary | ICD-10-CM

## 2017-10-11 DIAGNOSIS — I10 Essential (primary) hypertension: Secondary | ICD-10-CM

## 2017-10-11 LAB — POCT GLYCOSYLATED HEMOGLOBIN (HGB A1C): Hemoglobin A1C: 6.7 % — AB (ref 4.0–5.6)

## 2017-10-11 MED ORDER — DULOXETINE HCL 60 MG PO CPEP: 60.0000 mg | ORAL_CAPSULE | Freq: Every day | ORAL | 5 refills | Status: DC

## 2017-10-11 MED ORDER — TRESIBA FLEXTOUCH 200 UNIT/ML ~~LOC~~ SOPN: 24.0000 [IU] | PEN_INJECTOR | Freq: Every day | SUBCUTANEOUS | 5 refills | Status: DC

## 2017-10-11 MED ORDER — LOSARTAN POTASSIUM 50 MG PO TABS: 50.0000 mg | ORAL_TABLET | Freq: Every day | ORAL | 5 refills | Status: DC

## 2017-10-11 MED ORDER — AMLODIPINE BESYLATE 10 MG PO TABS: 10.0000 mg | ORAL_TABLET | Freq: Every day | ORAL | 5 refills | Status: DC

## 2017-10-11 MED ORDER — GABAPENTIN 300 MG PO CAPS: 600.0000 mg | ORAL_CAPSULE | Freq: Three times a day (TID) | ORAL | 5 refills | Status: DC

## 2017-10-11 MED ORDER — ACETAMINOPHEN-CODEINE #3 300-30 MG PO TABS: 1.0000 | ORAL_TABLET | Freq: Every day | ORAL | 0 refills | Status: AC

## 2017-10-11 MED FILL — AMLODIPINE BESYLATE 10 MG T: 10 | 30 days supply | Qty: 30 | Fill #0

## 2017-10-11 MED FILL — LOSARTAN POTASSIUM 50 MG TA: 50 | 30 days supply | Qty: 30 | Fill #0

## 2017-10-11 MED FILL — GABAPENTIN 300 MG CAPSULE: 300 | 30 days supply | Qty: 180 | Fill #0

## 2017-10-11 MED FILL — DULoxetine HCL 60 MG CPEP: 60 | 30 days supply | Qty: 30 | Fill #0

## 2017-10-11 NOTE — Progress Notes (Signed)
Subjective:  Patient ID: Kyle Novak, male    DOB: [birth year]  Age: 52 y.o. MRN: [id number]  CC: DM  HPI Kyle Novak is a 52 y.o. male with a medical history of alcohol abuse, DM2, diabetic polyneuropathy, bilateral foot drop, HTN, and lesion of lumbosacral nerve root presents as a new patient for management of DM2 and HTN. 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 MRI lumbar spine from 03/16/17 revealed 5 mm spherical lesion adjacent to the filum terminate and multiple cauda equina nerve roots. MR lumbar spine with and without contrast was recommend after 3-6 months but patient is uninsured and unable to afford. He was also advised to see a neurosurgeon but patient could not afford    Pt has bilateral foot drop which he says occurred suddenly. There is also bilateral lower extremity "burning". Has not been able to see a neurologist but previous work up includes EBV, Oligoclonal bands (CSF), HSV, CK, Magnesium, Ethanol, RPR, CSF culture, and HIV which were all negative/normal. Positive Lyme Ab at 1.8. Unfortunately, patient had an injury to his right knee one month ago with a fall while walking. His right knee "buckled" and landed with his knee hyperflexed. His knee subsequently swelled and had severe pain. He was seen at the ED six weeks ago and had XR right knee done which was negative. MR of right knee was subsequently ordered but patient could not afford to get done. Pt does not endorse any other associated symptoms, current proximal muscle weakness, ascending neuropathy, or urinary/fecal incontinence. Says amitriptyline is ineffective for neuropathy and thinks he is becoming "immune" to his current dose of Gabapentin.     Outpatient Medications Prior to Visit  Medication Sig Dispense Refill  . amitriptyline (ELAVIL) 25 MG tablet Take 50 mg by mouth 3 (three) times daily. Takes 50 mg in am, 50 mg at lunch, 128m at bedtime  6  . amLODipine (NORVASC) 10 MG  tablet Take 1 tablet by mouth daily.     .Marland Novak (NEURONTIN) 300 MG capsule Take 600 mg by mouth 3 (three) times daily.  6  . ibuprofen (ADVIL,MOTRIN) 200 MG tablet Take 200 mg by mouth every 6 (six) hours as needed for moderate pain.    . Menthol, Topical Analgesic, (ICY HOT EX) Apply 1 application topically daily as needed (pain).    . Multiple Vitamin (MULITIVITAMIN WITH MINERALS) TABS Take 1 tablet by mouth daily.    . TRESIBA FLEXTOUCH 200 UNIT/ML SOPN Inject 1 each into the skin daily.  0  . HYDROcodone-acetaminophen (NORCO/VICODIN) 5-325 MG tablet Take 2 tablets by mouth every 4 (four) hours as needed. (Patient not taking: Reported on 09/16/2017) 10 tablet 0   Facility-Administered Medications Prior to Visit  Medication Dose Route Frequency Provider Last Rate Last Dose  . acetaminophen (TYLENOL) tablet 650 mg  650 mg Oral Q4H PRN MLorriane Shire MD         ROS Review of Systems  Constitutional: Negative for chills, fever and malaise/fatigue.  Eyes: Negative for blurred vision.  Respiratory: Negative for shortness of breath.   Cardiovascular: Negative for chest pain and palpitations.  Gastrointestinal: Negative for abdominal pain and nausea.  Genitourinary: Negative for dysuria and hematuria.  Musculoskeletal: Negative for joint pain and myalgias.       Leg pain bilateral   Skin: Negative for rash.  Neurological: Positive for focal weakness (of bilateral lower extremities ). Negative for tingling and headaches.  Burning pain of leg bilaterally  Psychiatric/Behavioral: Negative for depression. The patient is not nervous/anxious.     Objective:  BP (!) 161/114 (BP Location: Left Arm, Patient Position: Sitting, Cuff Size: Normal)   Pulse (!) 131   Temp 98.3 F (36.8 C) (Oral)   Ht 5' 8.5" (1.74 m)   Wt 148 lb 3.2 oz (67.2 kg)   SpO2 98%   BMI 22.21 kg/m   BP/Weight 10/11/2017 03/10/4823 0/0/3704  Systolic BP 888 916 945  Diastolic BP 038 882 800  Wt. (Lbs) 148.2  - 162  BMI 22.21 - 24.27      Physical Exam  Constitutional: He is oriented to person, place, and time.  Well developed, thin, NAD, polite, wearing drop foot support bilaterally, using cane for ambulation.  HENT:  Head: Normocephalic and atraumatic.  Eyes: No scleral icterus.  Neck: Normal range of motion. Neck supple. No thyromegaly present.  Cardiovascular: Normal rate, regular rhythm and normal heart sounds.  Pulmonary/Chest: Effort normal and breath sounds normal.  Musculoskeletal: He exhibits no edema.  Right knee with mild effusion and mild tenderness. Bilateral lower extremities appear moderately atrophic  Neurological: He is alert and oriented to person, place, and time.  Skin: Skin is warm and dry. No rash noted. No erythema. No pallor.  Psychiatric: He has a normal mood and affect. His behavior is normal. Thought content normal.  Vitals reviewed.    Assessment & Plan:   1. Diabetic polyneuropathy associated with type 2 diabetes mellitus (HCC) - HgB A1c 6.7% - Refill TRESIBA FLEXTOUCH 200 UNIT/ML SOPN; Inject 24 Units into the skin daily.  Dispense: 3 pen; Refill: 5 - Refill gabapentin (NEURONTIN) 300 MG capsule; Take 2 capsules (600 mg total) by mouth 3 (three) times daily.  Dispense: 180 capsule; Refill: 5 - Begin DULoxetine (CYMBALTA) 60 MG capsule; Take 1 capsule (60 mg total) by mouth daily.  Dispense: 30 capsule; Refill: 5  2. Chronic pain of right knee - Begin acetaminophen-codeine (TYLENOL #3) 300-30 MG tablet; Take 1 tablet by mouth at bedtime for 14 days.  Dispense: 14 tablet; Refill: 0  3. Hypertension, unspecified type - Refill amLODipine (NORVASC) 10 MG tablet; Take 1 tablet (10 mg total) by mouth daily.  Dispense: 30 tablet; Refill: 5 - Begin losartan (COZAAR) 50 MG tablet; Take 1 tablet (50 mg total) by mouth daily.  Dispense: 30 tablet; Refill: 5  4. Foot drop, bilateral - Protein electrophoresis, serum (MM/POEMS work up) - Protein Electrophoresis,  Urine Rflx. (MM/POEMS work up) - Sedimentation Rate - C-reactive protein - ANA w/Reflex - Vitamin B12 - Rapid development of bilateral foot drop warrants neurological work up to possibly include genetic testing for conditions such as MS, ALS, CMT, and other pathologies such as his spinal lesion or Lyme disease which was mildly elevated (CBC and CMP normal, collecting ESR today). Awaiting CAFA completion.  Meds ordered this encounter  Medications  . DISCONTD: DULoxetine (CYMBALTA) 60 MG capsule    Sig: Take 1 capsule (60 mg total) by mouth daily.    Dispense:  30 capsule    Refill:  5    Order Specific Question:   Supervising Provider    Answer:   Charlott Rakes [[id number]]  . acetaminophen-codeine (TYLENOL #3) 300-30 MG tablet    Sig: Take 1 tablet by mouth at bedtime for 14 days.    Dispense:  14 tablet    Refill:  0    Order Specific Question:   Supervising Provider    Answer:  NEWLIN, ENOBONG [[id number]]  . TRESIBA FLEXTOUCH 200 UNIT/ML SOPN    Sig: Inject 24 Units into the skin daily.    Dispense:  3 pen    Refill:  5    Order Specific Question:   Supervising Provider    Answer:   Charlott Rakes [[id number]]  . gabapentin (NEURONTIN) 300 MG capsule    Sig: Take 2 capsules (600 mg total) by mouth 3 (three) times daily.    Dispense:  180 capsule    Refill:  5    Order Specific Question:   Supervising Provider    Answer:   Charlott Rakes [[id number]]  . amLODipine (NORVASC) 10 MG tablet    Sig: Take 1 tablet (10 mg total) by mouth daily.    Dispense:  30 tablet    Refill:  5    Order Specific Question:   Supervising Provider    Answer:   Charlott Rakes [[id number]]  . losartan (COZAAR) 50 MG tablet    Sig: Take 1 tablet (50 mg total) by mouth daily.    Dispense:  30 tablet    Refill:  5    Order Specific Question:   Supervising Provider    Answer:   Charlott Rakes [[id number]]  . DULoxetine (CYMBALTA) 60 MG capsule    Sig: Take 1 capsule (60 mg total) by mouth daily.    Dispense:  30 capsule     Refill:  5    Order Specific Question:   Supervising Provider    Answer:   Charlott Rakes [[id number]]    Follow-up: Return in about 4 weeks (around 11/08/2017) for Knee pain.   [name] PA

## 2017-10-11 NOTE — Progress Notes (Signed)
Pt fell about a month ago and now has pain and swelling in his right knee

## 2017-10-11 NOTE — Patient Instructions (Signed)
Knee Effusion Knee effusion means that you have extra fluid in your knee. This can cause pain. Your knee may be more difficult to bend and move. Follow these instructions at home:  Use crutches as told by your doctor.  Wear a knee brace as told by your doctor.  Apply ice to the swollen area: ? Put ice in a plastic bag. ? Place a towel between your skin and the bag. ? Leave the ice on for 20 minutes, 2-3 times per day.  Keep your knee raised (elevated) when you are sitting or lying down.  Take medicines only as told by your doctor.  Do any rehabilitation or strengthening exercises as told by your doctor.  Rest your knee as told by your doctor. You may start doing your normal activities again when your doctor says it is okay.  Keep all follow-up visits as told by your doctor. This is important. Contact a doctor if:  You continue to have pain in your knee. Get help right away if:  You have increased swelling or redness of your knee.  You have severe pain in your knee.  You have a fever. This information is not intended to replace advice given to you by your health care provider. Make sure you discuss any questions you have with your health care provider. Document Released: 03/07/2010 Document Revised: 07/11/2015 Document Reviewed: 09/18/2013 Elsevier Interactive Patient Education  2018 [employer].

## 2017-10-12 ENCOUNTER — Other Ambulatory Visit (INDEPENDENT_AMBULATORY_CARE_PROVIDER_SITE_OTHER): Payer: Self-pay | Admitting: Physician Assistant

## 2017-10-12 DIAGNOSIS — E1142 Type 2 diabetes mellitus with diabetic polyneuropathy: Secondary | ICD-10-CM

## 2017-10-12 MED ORDER — INSULIN DEGLUDEC 100 UNIT/ML ~~LOC~~ SOPN: 24.0000 [IU] | PEN_INJECTOR | Freq: Every day | SUBCUTANEOUS | 1 refills | Status: DC

## 2017-10-12 MED FILL — !TRESIBA FLEXTOUCH 100 UNIT: 100/ML (3) | 12 days supply | Qty: 3 | Fill #0

## 2017-10-13 ENCOUNTER — Other Ambulatory Visit (INDEPENDENT_AMBULATORY_CARE_PROVIDER_SITE_OTHER): Payer: Self-pay | Admitting: Physician Assistant

## 2017-10-13 DIAGNOSIS — R748 Abnormal levels of other serum enzymes: Secondary | ICD-10-CM

## 2017-10-13 LAB — PROTEIN ELECTROPHORESIS, SERUM
A/G Ratio: 1.3 (ref 0.7–1.7)
ALBUMIN ELP: 4.5 g/dL — AB (ref 2.9–4.4)
ALPHA 1: 0.3 g/dL (ref 0.0–0.4)
Alpha 2: 0.8 g/dL (ref 0.4–1.0)
Beta: 1.3 g/dL (ref 0.7–1.3)
Gamma Globulin: 1 g/dL (ref 0.4–1.8)
Globulin, Total: 3.4 g/dL (ref 2.2–3.9)
Total Protein: 7.9 g/dL (ref 6.0–8.5)

## 2017-10-13 LAB — PROTEIN ELECTROPHORESIS, URINE REFLEX
ALBUMIN ELP UR: 38.8 %
ALPHA-1-GLOBULIN, U: 2.5 %
Alpha-2-Globulin, U: 11.4 %
BETA GLOBULIN, U: 21.5 %
Gamma Globulin, U: 25.8 %
Protein, Ur: 30.4 mg/dL

## 2017-10-13 LAB — VITAMIN B12

## 2017-10-13 LAB — SEDIMENTATION RATE: SED RATE: 2 mm/h (ref 0–30)

## 2017-10-13 LAB — ANA W/REFLEX: ANA: NEGATIVE

## 2017-10-13 LAB — C-REACTIVE PROTEIN: CRP: <1 mg/L (ref 0–10)

## 2017-10-14 ENCOUNTER — Telehealth (INDEPENDENT_AMBULATORY_CARE_PROVIDER_SITE_OTHER): Payer: Self-pay

## 2017-10-14 NOTE — Telephone Encounter (Signed)
Patient is aware to stop taking b12, will recheck B12 in a few months; patient also aware that he will be fine with the medications prescribed for pain. Nat Christen, CMA

## 2017-10-14 NOTE — Telephone Encounter (Signed)
-----   Message from [name], PA-C sent at 10/13/2017  5:50 PM EDT ----- Specialized testing negative except for extremely his Vit B12. I would like to send a fresh sample of blood to a hematologist for microscopic review. This would be send to look for signs of blood disorders.

## 2017-10-14 NOTE — Telephone Encounter (Signed)
He will be fine with the medications I have prescribed to him for pain. Please tell him to stop taking Vitamin B12. No need for hematology review. Will retest Vit B12 level in a few months.

## 2017-10-14 NOTE — Telephone Encounter (Signed)
Patient called in stating PCP was supposed to prescribe an antiinflammatory. Please advise. While on the call patient was informed of results being negative except for Vitamin B12 being too high. Patient states he takes daily b12, wants to know if he should stop. He is aware that PCP would like to send a fresh blood sample for microscopic review to look for signs of blood disorders. Wants to know how soon he should return to have that done. Nat Christen, CMA

## 2017-10-25 ENCOUNTER — Ambulatory Visit: Payer: Self-pay | Attending: Family Medicine

## 2017-10-26 MED FILL — $TRESIBA FLEXTOUCH 200 UNIT: 200 | 75 days supply | Qty: 9 | Fill #0

## 2017-11-05 ENCOUNTER — Encounter: Payer: Self-pay | Admitting: Diagnostic Neuroimaging

## 2017-11-05 ENCOUNTER — Ambulatory Visit: Payer: BLUE CROSS/BLUE SHIELD | Admitting: Diagnostic Neuroimaging

## 2017-11-05 ENCOUNTER — Telehealth: Payer: Self-pay | Admitting: *Deleted

## 2017-11-05 VITALS — BP 135/94 | HR 110 | Ht 68.5 in | Wt 148.6 lb

## 2017-11-05 DIAGNOSIS — E1142 Type 2 diabetes mellitus with diabetic polyneuropathy: Secondary | ICD-10-CM

## 2017-11-05 DIAGNOSIS — G544 Lumbosacral root disorders, not elsewhere classified: Secondary | ICD-10-CM

## 2017-11-05 DIAGNOSIS — R292 Abnormal reflex: Secondary | ICD-10-CM

## 2017-11-05 DIAGNOSIS — G621 Alcoholic polyneuropathy: Secondary | ICD-10-CM | POA: Diagnosis not present

## 2017-11-05 DIAGNOSIS — M21371 Foot drop, right foot: Secondary | ICD-10-CM

## 2017-11-05 DIAGNOSIS — M21372 Foot drop, left foot: Secondary | ICD-10-CM

## 2017-11-05 NOTE — Telephone Encounter (Signed)
Relayed to pt that Dr. Leta Baptist ordered MRI cervical and lumbar after he left.  Wanted to let him know.   He verbalized understanding.

## 2017-11-05 NOTE — Progress Notes (Signed)
[facility]  PATIENT: Kyle Novak DOB: [birth year]  REFERRING CLINICIAN: Ritta Slot, MD HISTORY FROM: patient, chart review REASON FOR VISIT: follow up    HISTORICAL  CHIEF COMPLAINT:  Chief Complaint  Patient presents with  . NX  Bilateral foot drop/ neuropathy    Rm 7,  . referral Linzie Collin, NP    Seen 03/2017 by you.      HISTORY OF PRESENT ILLNESS:   UPDATE (11/05/17, VRP): Since last visit, doing poorly. Symptoms are progressive and severe. More pain, weakness, muscle wasting, weight loss. He injured his right knee and having morepain. No alleviating or aggravating factors. Tolerating gabapentin and cymbalta (x 1 month) but no major pain relief.Marland Kitchen    PRIOR HPI (03/31/17, VRP): 52 year old male with history of diabetes, high blood pressure, alcohol abuse, here for evaluation of lower extremity numbness, weakness, pain.  January 2018 patient was diagnosed with diabetes and hemoglobin A1c was 15.  In December 2018 patient started to have shooting electrical pains in his knees, thighs and calves.  Over 4 weeks symptoms continue to worsen, leading to bilateral foot drop, numbness in his feet and legs, numbness up to his thighs.  Now patient having some numbness and tingling in his fingers and hands.  Patient was referred to me for consult.  Before he could come for this appointment patient was admitted to the hospital due to numbness, weakness in his legs.  MRI of the lumbar spine showed a small intrathecal cystic enhancing lesion, possibly a schwannoma or ependymoma.  MRI of the brain, cervical thoracic spine were otherwise unremarkable.  This was felt to be an incidental finding.  Lumbar puncture showed normal protein, slightly elevated glucose, and no white blood cells.  Patient was diagnosed with bilateral foot drop and neuropathy and recommended for outpatient follow-up.  Diabetes control has improved.  Patient is on insulin.  Last hemoglobin A1c was  7.6.  Patient has daily tobacco abuse, smoking 1 pack of cigarettes over 1-2 days.  Patient has almost daily alcohol use drinking 2-3 shots per day, sometimes more on the weekend.  Sometimes he goes 2-3 days without drinking.  This is been going on for at least 4-5 years.  He denies any withdrawal symptoms when he stops drinking.   REVIEW OF SYSTEMS: Full 14 system review of systems performed and negative with exception of: weight loss fatigue swelling in knee constipation feeling cold.   ALLERGIES: No Known Allergies  HOME MEDICATIONS: Outpatient Medications Prior to Visit  Medication Sig Dispense Refill  . amLODipine (NORVASC) 10 MG tablet Take 1 tablet (10 mg total) by mouth daily. 30 tablet 5  . DULoxetine (CYMBALTA) 60 MG capsule Take 1 capsule (60 mg total) by mouth daily. 30 capsule 5  . gabapentin (NEURONTIN) 300 MG capsule Take 2 capsules (600 mg total) by mouth 3 (three) times daily. 180 capsule 5  . ibuprofen (ADVIL,MOTRIN) 200 MG tablet Take 200 mg by mouth every 6 (six) hours as needed for moderate pain.    Marland Kitchen insulin degludec (TRESIBA FLEXTOUCH) 100 UNIT/ML SOPN FlexTouch Pen Inject 0.24 mLs (24 Units total) into the skin daily. 1 pen 1  . losartan (COZAAR) 50 MG tablet Take 1 tablet (50 mg total) by mouth daily. 30 tablet 5  . Menthol, Topical Analgesic, (ICY HOT EX) Apply 1 application topically daily as needed (pain).    . Multiple Vitamin (MULITIVITAMIN WITH MINERALS) TABS Take 1 tablet by mouth daily.    Tyler Aas FLEXTOUCH 200  UNIT/ML SOPN Inject 24 Units into the skin daily. 3 pen 5   Facility-Administered Medications Prior to Visit  Medication Dose Route Frequency Provider Last Rate Last Dose  . acetaminophen (TYLENOL) tablet 650 mg  650 mg Oral Q4H PRN Lorriane Shire, MD        PAST MEDICAL HISTORY: Past Medical History:  Diagnosis Date  . Diabetes mellitus without complication ([place])   . Hypertension     PAST SURGICAL HISTORY: Past Surgical History:    Procedure Laterality Date  . NO PAST SURGERIES      FAMILY HISTORY: Family History  Problem Relation Age of Onset  . Lung cancer Father     SOCIAL HISTORY:  Social History   Socioeconomic History  . Marital status: Single    Spouse name: Not on file  . Number of children: Not on file  . Years of education: Not on file  . Highest education level: Not on file  Occupational History  . Not on file  Social Needs  . Financial resource strain: Not on file  . Food insecurity:    Worry: Not on file    Inability: Not on file  . Transportation needs:    Medical: Not on file    Non-medical: Not on file  Tobacco Use  . Smoking status: Current Every Day Smoker    Types: Cigarettes  . Smokeless tobacco: Never Used  Substance and Sexual Activity  . Alcohol use: Yes    Comment: OCCASIONAL  . Drug use: No  . Sexual activity: Not on file  Lifestyle  . Physical activity:    Days per week: Not on file    Minutes per session: Not on file  . Stress: Not on file  Relationships  . Social connections:    Talks on phone: Not on file    Gets together: Not on file    Attends religious service: Not on file    Active member of club or organization: Not on file    Attends meetings of clubs or organizations: Not on file    Relationship status: Not on file  . Intimate partner violence:    Fear of current or ex partner: Not on file    Emotionally abused: Not on file    Physically abused: Not on file    Forced sexual activity: Not on file  Other Topics Concern  . Not on file  Social History Narrative  . Not on file     PHYSICAL EXAM  GENERAL EXAM/CONSTITUTIONAL: Vitals:  Vitals:   11/05/17 0953  BP: (!) 135/94  Pulse: (!) 110  Weight: 148 lb 9.6 oz (67.4 kg)  Height: 5' 8.5" (1.74 m)   Wt Readings from Last 8 Encounters:  11/05/17 148 lb 9.6 oz (67.4 kg)  10/11/17 148 lb 3.2 oz (67.2 kg)  09/16/17 162 lb (73.5 kg)  06/25/17 162 lb (73.5 kg)  03/31/17 172 lb 3.2 oz (78.1  kg)  03/16/17 161 lb 13.1 oz (73.4 kg)  02/04/16 172 lb (78 kg)    Body mass index is 22.27 kg/m. No exam data present  Patient is in no distress; well developed, nourished and groomed; neck is supple  CARDIOVASCULAR:  Examination of carotid arteries is normal; no carotid bruits  Regular rate and rhythm, no murmurs  Examination of peripheral vascular system by observation and palpation is normal  EYES:  Ophthalmoscopic exam of optic discs and posterior segments is normal; no papilledema or hemorrhages  MUSCULOSKELETAL:  Gait, strength, tone, movements  noted in Neurologic exam below  NEUROLOGIC: MENTAL STATUS:  No flowsheet data found.  awake, alert, oriented to person, place and time  recent and remote memory intact  normal attention and concentration  language fluent, comprehension intact, naming intact,   fund of knowledge appropriate  CRANIAL NERVE:   2nd - no papilledema on fundoscopic exam  2nd, 3rd, 4th, 6th - pupils equal and reactive to light, visual fields full to confrontation, extraocular muscles intact, no nystagmus  5th - facial sensation symmetric  7th - facial strength symmetric  8th - hearing intact  9th - palate elevates symmetrically, uvula midline  11th - shoulder shrug symmetric  12th - tongue protrusion midline  MOTOR:   ATROPHY IN THIGHS AND CALVES  NO FASCICULATIONS  Full strength in the BUE  BLE --> FULL STRENGTH EXCEPT BILATERAL FOOT DORSIFLEXION (RIGHT 2-3, LEFT 1-2)   SENSORY:   normal and symmetric to light touch, pinprick, temperature, vibration; EXCEPT DECR VIB IN FEET  COORDINATION:   finger-nose-finger, fine finger movements --> MILD ACTION TREMOR  REFLEXES:   deep tendon reflexes --> BUE 3, KNEES 1, ANKLES 0; BORDERLINE HOFFMANS  GAIT/STATION:   CAUTIOUS STEPPAGE GAIT; BILATERAL FOOT DROP    DIAGNOSTIC DATA (LABS, IMAGING, TESTING) - I reviewed patient records, labs, notes, testing and imaging  myself where available.  Lab Results  Component Value Date   WBC 10.2 06/25/2017   HGB 14.8 06/25/2017   HCT 44.1 06/25/2017   MCV 85.8 06/25/2017   PLT 320 06/25/2017      Component Value Date/Time   NA 138 06/25/2017 1159   K 4.1 06/25/2017 1159   CL 103 06/25/2017 1159   CO2 26 06/25/2017 1159   GLUCOSE 263 (H) 06/25/2017 1159   BUN 12 06/25/2017 1159   CREATININE 0.79 06/25/2017 1159   CALCIUM 9.6 06/25/2017 1159   PROT 7.9 10/11/2017 1647   ALBUMIN 4.3 03/17/2017 0454   AST 21 03/17/2017 0454   ALT 24 03/17/2017 0454   ALKPHOS 95 03/17/2017 0454   BILITOT 0.8 03/17/2017 0454   GFRNONAA >60 06/25/2017 1159   GFRAA >60 06/25/2017 1159   No results found for: CHOL, HDL, LDLCALC, LDLDIRECT, TRIG, CHOLHDL  Hemoglobin A1C  Date Value Ref Range Status  10/11/2017 6.7 (A) 4.0 - 5.6 % Final   Hgb A1c MFr Bld  Date Value Ref Range Status  03/19/2017 7.6 (H) 4.8 - 5.6 % Final    Comment:    (NOTE) Pre diabetes:          5.7%-6.4% Diabetes:              >6.4% Glycemic control for   <7.0% adults with diabetes    Lab Results  Component Value Date   VITAMINB12 >2000 (H) 10/11/2017   No results found for: TSH   03/15/17 MRI lumbar spine (with and without) [I reviewed images myself and agree with interpretation. -VRP]  - Enhancing intradural midline 5 mm spherical lesion, adjacent to the filum terminale and multiple cauda equina nerve roots. 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 Short-term follow-up 3-6 months recommended with repeat lumbar MRI without and with contrast. In addition, elective, not urgent, neurosurgical consultation may be warranted. - L5-S1 central protrusion.  No impingement.  03/16/17 MRI brain (with and without) [I reviewed images myself and agree with interpretation. -VRP]  - Normal MRI of the brain. No intracranial mass lesion or evidence for  metastatic  disease.  03/16/17 MRI cervical / thoracic spine (without) [I reviewed images myself and agree with interpretation. -VRP]  1. Intermittent cervical spine disc and endplate degeneration with no spinal stenosis. Normal cervical spinal cord. 2. Thoracic disc degeneration at T8-T9 and T9-T10. Small thoracic disc herniations at both levels not resulting in thoracic spinal stenosis. Normal thoracic spinal cord otherwise. 3. Intermittent mild cervical and thoracic neural foraminal stenosis, aside from moderate left thoracic neural foraminal stenosis at the T2 nerve level. 4. Note that this study was done without IV contrast, as Brain MRI without and with contrast is still pending at this time (scheduled for later today), and decision was made to follow-up with postcontrast cervical and/or spine imaging as necessary. But no cervical or thoracic spine lesion is identified for which IV contrast would be valuable.  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  The bilateral peroneal nerves are significantly affected.     ASSESSMENT AND PLAN  52 y.o. year old male here with new onset pain, weakness, numbness in lower extremities starting in December 2018, with fairly rapid worsening symptoms over 2-4 weeks.  This could represent autoimmune or inflammatory peripheral neuropathy.  Also could be related to diabetic plexopathy.  Diabetic and alcoholic neuropathy are also possible.   Ddx: neuropathy (diabetic vs alcoholic) vs bilateral lumbosacral plexopathy (diabetic)  1. Diabetic polyneuropathy associated with type 2 diabetes mellitus ([place])   2. Alcoholic peripheral neuropathy ([place])   3. Bilateral foot-drop   4. Lesion of lumbosacral nerve root      PLAN:  NEUROPATHY PAIN  (worsening) - continue gabapentin (600mg  three times a day; may need to titrate higher for pain control) - continue cymbalta  NEUROPATHY TREATMENT - continue diabetes treatment - continue etoh cessation  LUMBAR SPINE LESION - repeat MRI lumbar spine - follow up with neurosurgery re: lumbar root  lesion (likely incidental finding)  UPPER EXT HYPERREFLEXIA (new) - possible related to thin habitus; check MRI cervical spine to rule out spinal stenosis  WEIGHT LOSS - Disuse atrophy? Motor neuropathy? Encouraged protein in take and exercise. - follow up with PCP  Orders Placed This Encounter  Procedures  . MR CERVICAL SPINE W WO CONTRAST  . MR Lumbar Spine W Wo Contrast   Return in about 6 months (around 05/06/2018).    Penni Bombard, MD 6/60/6301, 6:01 AM Certified in Neurology, Neurophysiology and Neuroimaging  [facility] [address] [place], [place] [address] [phone]

## 2017-11-08 ENCOUNTER — Telehealth: Payer: Self-pay | Admitting: Diagnostic Neuroimaging

## 2017-11-08 MED FILL — ?DULoxetine HCL 60MG CPEP: 60 | 30 days supply | Qty: 30 | Fill #1

## 2017-11-08 NOTE — Telephone Encounter (Signed)
[employer] Auth: [id number] (exp. 11/08/17 to 12/07/17) order sent to GI. They will reach out to the pt to schedule.

## 2017-11-10 ENCOUNTER — Other Ambulatory Visit: Payer: Self-pay

## 2017-11-10 ENCOUNTER — Encounter (INDEPENDENT_AMBULATORY_CARE_PROVIDER_SITE_OTHER): Payer: Self-pay | Admitting: Physician Assistant

## 2017-11-10 ENCOUNTER — Ambulatory Visit (INDEPENDENT_AMBULATORY_CARE_PROVIDER_SITE_OTHER): Payer: BLUE CROSS/BLUE SHIELD | Admitting: Physician Assistant

## 2017-11-10 VITALS — BP 140/96 | HR 104 | Temp 97.9°F | Ht 68.5 in | Wt 148.2 lb

## 2017-11-10 DIAGNOSIS — M25561 Pain in right knee: Secondary | ICD-10-CM | POA: Diagnosis not present

## 2017-11-10 DIAGNOSIS — G8929 Other chronic pain: Secondary | ICD-10-CM

## 2017-11-10 MED ORDER — ACETAMINOPHEN-CODEINE #3 300-30 MG PO TABS: 1.0000 | ORAL_TABLET | Freq: Four times a day (QID) | ORAL | 0 refills | Status: AC | PRN

## 2017-11-10 NOTE — Patient Instructions (Signed)
Anterior Cruciate Ligament Tear Ligaments are strong bands of tissue that connect bones to each other. The anterior cruciate ligament (ACL) connects your shin bone to your thigh bone. A tear in this ligament can cause pain and make it hard for you to put weight on your leg (use your leg to support your body weight). There are three types of ACL injuries:  Grade 1. In this type, the ligament is stretched.  Grade 2. In this type, the ligament is partially torn.  Grade 3. In this type, the ligament is completely torn.  What are the causes? This condition happens when too much pressure is put on the ACL. It may happen if:  You twist your knee, especially with your foot planted.  You make a quick change in direction (cut and pivot).  You slow down quickly while running.  You land a jump without bending your knee.  You forcefully straighten your knee more than normal (hyperextend your knee).  You are hit in the knee.  You hit your knee on the ground.  What increases the risk? This condition is more likely to develop in:  Women.  People who play sports that involve jumping or changing directions often. These include: ? Football. ? Basketball. ? Volleyball. ? Soccer. ? Skiing. ? Hockey. ? Gymnastics  What are the signs or symptoms? Symptoms of this condition include:  A popping sound at the time of injury.  A feeling that your knee is bending abnormally at the time of injury.  Pain.  Swelling.  Tenderness.  Instability when you try to put weight on your injured leg.  Inability to move your knee as far as normal.  Difficulty walking.  How is this diagnosed? This condition may be diagnosed based on:  Your symptoms.  Your medical history.  A physical exam.  Tests, such as: ? An X-ray. This may be done to check for bone injuries. ? MRI. This may be done to see if the tear is partial or complete and to check for additional injuries.  During your physical  exam, your provider will test your knee to see if it moves more than it should (laxity). How is this treated? This condition may be treated with:  Resting your knee.  Avoiding activities that cause pain, instability, or swelling.  Raising (elevating) your knee while resting.  Keeping weight off your leg until pain and swelling improve. You may need to use crutches or a walker.  Icing your knee.  Taking medicine to reduce pain and swelling.  Wearing a knee brace.  Doing range-of-motion, strengthening, and stretching exercises (physical therapy).  Surgery. This may be needed if you are very active and have a complete tear.  Follow these instructions at home: If you have a brace:  Wear it as told by your health care provider. Remove it only as told by your health care provider.  Loosen the brace if your toes tingle, become numb, or turn cold and blue.  Do not let your brace get wet if it is not waterproof.  Keep the brace clean. Managing pain, stiffness, and swelling  If directed, put ice on your knee: ? Put ice in a plastic bag. ? Place a towel between your skin and the bag. ? Leave the ice on for 20 minutes, 2-3 times a day.  Move your foot often to avoid stiffness and to lessen swelling.  Elevate your knee above the level of your heart while you are sitting or lying down. Driving  Do not drive or operate heavy machinery while taking prescription pain medicine.  Ask your health care provider when it is safe to drive if you have a brace on your leg. Activity  Return to your normal activities as told by your health care provider. Ask your health care provider what activities are safe for you.  Do exercises as told by your health care provider. General instructions  Do not use the injured limb to support your body weight until your health care provider says that you can. Use crutches or a walker as told by your health care provider.  Do not use any tobacco  products, such as cigarettes, chewing tobacco, and e-cigarettes. Tobacco can delay healing. If you need help quitting, ask your health care provider.  Take over-the-counter and prescription medicines only as told by your health care provider.  Keep all follow-up visits as told by your health care provider. This is important. How is this prevented?  Warm up and stretch before being active.  Cool down and stretch after being active.  Give your body time to rest between periods of activity.  Make sure to use equipment that fits you.  If you wear cleats, make sure they are appropriate for your playing surface.  Be safe and responsible while being active to avoid falls.  Maintain physical fitness, including: ? Strength. ? Flexibility. Contact a health care provider if:  Your symptoms are not improving.  Your symptoms are getting worse. This information is not intended to replace advice given to you by your health care provider. Make sure you discuss any questions you have with your health care provider. Document Released: 09/03/2004 Document Revised: 10/08/2015 Document Reviewed: 01/19/2015 Elsevier Interactive Patient Education  2017 [employer].

## 2017-11-10 NOTE — Progress Notes (Signed)
Subjective:  Patient ID: Kyle Novak, male    DOB: Jun 20, 1965  Age: 52 y.o. MRN: [id number]  CC: f/u knee pain  HPI KEALAN BUCHAN is a 52 y.o. male with a medical history of alcohol abuse, DM2, diabetic polyneuropathy, bilateral foot drop, HTN, and lesion of lumbosacral nerve root presents to f/u on right knee pain. Right knee pain is severe, affecting his gait, and is keeping him up at night. Rated 9/10. Onset two months ago after a fall in which his knee was hyperflexed. Since then he has felt a "pressure and stabbing pain". Gabapentin and Cymbalta have not been helpful. Volataren gel and IcyHot have been minimally helpful. XR right knee done on 08/27/17 was negative. Was seen by an orthopedic specialist outside of [facility] but was told he can't do anything until he gets an MRI. Does not endorse any other associated symptoms.    Outpatient Medications Prior to Visit  Medication Sig Dispense Refill  . amLODipine (NORVASC) 10 MG tablet Take 1 tablet (10 mg total) by mouth daily. 30 tablet 5  . DULoxetine (CYMBALTA) 60 MG capsule Take 1 capsule (60 mg total) by mouth daily. 30 capsule 5  . gabapentin (NEURONTIN) 300 MG capsule Take 2 capsules (600 mg total) by mouth 3 (three) times daily. 180 capsule 5  . ibuprofen (ADVIL,MOTRIN) 200 MG tablet Take 200 mg by mouth every 6 (six) hours as needed for moderate pain.    Marland Kitchen losartan (COZAAR) 50 MG tablet Take 1 tablet (50 mg total) by mouth daily. 30 tablet 5  . Multiple Vitamin (MULITIVITAMIN WITH MINERALS) TABS Take 1 tablet by mouth daily.    . TRESIBA FLEXTOUCH 200 UNIT/ML SOPN Inject 24 Units into the skin daily. 3 pen 5  . insulin degludec (TRESIBA FLEXTOUCH) 100 UNIT/ML SOPN FlexTouch Pen Inject 0.24 mLs (24 Units total) into the skin daily. (Patient not taking: Reported on 11/10/2017) 1 pen 1  . Menthol, Topical Analgesic, (ICY HOT EX) Apply 1 application topically daily as needed (pain).     Facility-Administered Medications Prior  to Visit  Medication Dose Route Frequency Provider Last Rate Last Dose  . acetaminophen (TYLENOL) tablet 650 mg  650 mg Oral Q4H PRN Lorriane Shire, MD         ROS Review of Systems  Constitutional: Negative for chills, fever and malaise/fatigue.  Eyes: Negative for blurred vision.  Respiratory: Negative for shortness of breath.   Cardiovascular: Negative for chest pain and palpitations.  Gastrointestinal: Negative for abdominal pain and nausea.  Genitourinary: Negative for dysuria and hematuria.  Musculoskeletal: Positive for joint pain. Negative for myalgias.  Skin: Negative for rash.  Neurological: Negative for tingling and headaches.  Psychiatric/Behavioral: Negative for depression. The patient is not nervous/anxious.     Objective:  BP (!) 140/96 (BP Location: Left Arm, Patient Position: Sitting, Cuff Size: Normal)   Pulse (!) 104   Temp 97.9 F (36.6 C) (Oral)   Ht 5' 8.5" (1.74 m)   Wt 148 lb 3.2 oz (67.2 kg)   SpO2 97%   BMI 22.21 kg/m   BP/Weight 11/10/2017 11/05/2017 03/19/69  Systolic BP 219 758 832  Diastolic BP 96 94 549  Wt. (Lbs) 148.2 148.6 148.2  BMI 22.21 22.27 22.21      Physical Exam  Constitutional: He is oriented to person, place, and time.  Well developed, well nourished, NAD, polite  HENT:  Head: Normocephalic and atraumatic.  Eyes: No scleral icterus.  Neck: Normal range of motion.  Neck supple. No thyromegaly present.  Cardiovascular: Normal rate, regular rhythm and normal heart sounds.  Pulmonary/Chest: Effort normal and breath sounds normal.  Musculoskeletal:  Right knee with positive anterior drawer. No abnormal laxity of MCL/LCL. No patellar apprehension. Normal aROM of right knee. Right knee without ecchymosis, edema, erythema, or increased warmth.   Neurological: He is alert and oriented to person, place, and time.  Antalgic gait favoring right side.  Skin: Skin is warm and dry. No rash noted. No erythema. No pallor.  Psychiatric: He  has a normal mood and affect. His behavior is normal. Thought content normal.  Vitals reviewed.    Assessment & Plan:   1. Chronic pain of right knee - Suspect ACL vs PCL tear - MR Knee Right Wo Contrast; Future - Begin Tylenol #3 x14 days, #56, zero refills   Meds ordered this encounter  Medications  . acetaminophen-codeine (TYLENOL #3) 300-30 MG tablet    Sig: Take 1 tablet by mouth every 6 (six) hours as needed for up to 14 days for moderate pain.    Dispense:  56 tablet    Refill:  0    Order Specific Question:   Supervising Provider    Answer:   Charlott Rakes [[id number]]    Follow-up: Return in about 4 weeks (around 12/08/2017) for knee pain.   [name] PA

## 2017-11-14 ENCOUNTER — Ambulatory Visit
Admission: RE | Admit: 2017-11-14 | Discharge: 2017-11-14 | Disposition: A | Discharge status: Home / Self Care | Payer: Medicaid Other | Source: Ambulatory Visit | Attending: Diagnostic Neuroimaging | Admitting: Diagnostic Neuroimaging

## 2017-11-14 ENCOUNTER — Ambulatory Visit
Admission: RE | Admit: 2017-11-14 | Discharge: 2017-11-14 | Disposition: A | Discharge status: Home / Self Care | Payer: BLUE CROSS/BLUE SHIELD | Source: Ambulatory Visit | Attending: Physician Assistant | Admitting: Physician Assistant

## 2017-11-14 DIAGNOSIS — M21371 Foot drop, right foot: Secondary | ICD-10-CM

## 2017-11-14 DIAGNOSIS — M25561 Pain in right knee: Principal | ICD-10-CM

## 2017-11-14 DIAGNOSIS — G544 Lumbosacral root disorders, not elsewhere classified: Secondary | ICD-10-CM | POA: Diagnosis not present

## 2017-11-14 DIAGNOSIS — M21372 Foot drop, left foot: Principal | ICD-10-CM

## 2017-11-14 DIAGNOSIS — G8929 Other chronic pain: Secondary | ICD-10-CM

## 2017-11-14 DIAGNOSIS — R292 Abnormal reflex: Secondary | ICD-10-CM

## 2017-11-14 MED ORDER — GADOBENATE DIMEGLUMINE 529 MG/ML IV SOLN
13.0000 mL | Freq: Once | INTRAVENOUS | Status: AC | PRN
  Administered 2017-11-14: 13 mL via INTRAVENOUS

## 2017-11-16 ENCOUNTER — Telehealth (INDEPENDENT_AMBULATORY_CARE_PROVIDER_SITE_OTHER): Payer: Self-pay

## 2017-11-16 MED FILL — AMLODIPINE BESYLATE 10 MG T: 10 | 30 days supply | Qty: 30 | Fill #1

## 2017-11-16 MED FILL — LOSARTAN POTASSIUM 50 MG TA: 50 | 30 days supply | Qty: 30 | Fill #1

## 2017-11-16 NOTE — Telephone Encounter (Signed)
Patient is aware that MRI is normal. Nat Christen, CMA

## 2017-11-16 NOTE — Telephone Encounter (Signed)
-----   Message from [name], PA-C sent at 11/15/2017  5:46 PM EDT ----- Normal MRI of the right knee.

## 2017-11-17 ENCOUNTER — Telehealth: Payer: Self-pay | Admitting: *Deleted

## 2017-11-17 NOTE — Telephone Encounter (Signed)
Spoke with patient and informed him the his MRI cervical spine and MRI lumber spine results are stable when compared to previous MRIs. Advised he continue with plan. He had no questions, verbalized understanding, appreciation of call.

## 2017-11-22 ENCOUNTER — Telehealth (INDEPENDENT_AMBULATORY_CARE_PROVIDER_SITE_OTHER): Payer: Self-pay | Admitting: Physician Assistant

## 2017-11-22 NOTE — Telephone Encounter (Signed)
Patient called stating that he dropped his medication DULoxetine (CYMBALTA) 60 MG capsule in the sink and was able to save 3 pills. Patient states that he called [facility] pharmacy to request a new refill but was told it was to early for him to get another refill. Patient also stated that he would like for the dosage to be increase since he is taking 2 pills in a day one in the morning and one in the afternoon. Stating that this is the only thing helping with his leg. Patient states he does not have any pills left.  Please advice [phone]  Thank you Emmit Pomfret

## 2017-11-22 NOTE — Telephone Encounter (Signed)
FWD to PCP. Kylian Loh S Lonisha Bobby, CMA  

## 2017-11-22 NOTE — Telephone Encounter (Signed)
Pt is not taking as directed, I can not refill due to non compliance. He will need to get CAFA done so that I can send him to [facility] if he is uninsured.

## 2017-11-23 ENCOUNTER — Other Ambulatory Visit (INDEPENDENT_AMBULATORY_CARE_PROVIDER_SITE_OTHER): Payer: Self-pay | Admitting: Physician Assistant

## 2017-11-23 DIAGNOSIS — M25561 Pain in right knee: Principal | ICD-10-CM

## 2017-11-23 DIAGNOSIS — G8929 Other chronic pain: Secondary | ICD-10-CM

## 2017-11-23 NOTE — Telephone Encounter (Signed)
Patient aware that no refill will be given. He will not qualify for CAFA. He has Nurse, mental health and medicaid. Proceed with referral. Nat Christen, CMA

## 2017-11-23 NOTE — Telephone Encounter (Signed)
I have made referral to pain clinic.

## 2017-12-02 MED FILL — GABAPENTIN 300 MG CAPSULE: 300 | 30 days supply | Qty: 180 | Fill #1

## 2017-12-02 MED FILL — ?DULoxetine HCL 60MG CPEP: 60 | 30 days supply | Qty: 30 | Fill #2

## 2017-12-09 ENCOUNTER — Ambulatory Visit (INDEPENDENT_AMBULATORY_CARE_PROVIDER_SITE_OTHER): Payer: BLUE CROSS/BLUE SHIELD | Admitting: Physician Assistant

## 2017-12-16 MED FILL — AMLODIPINE BESYLATE 10 MG T: 10 | 30 days supply | Qty: 30 | Fill #2

## 2017-12-16 MED FILL — LOSARTAN POTASSIUM 50 MG TA: 50 | 30 days supply | Qty: 30 | Fill #2

## 2017-12-28 ENCOUNTER — Ambulatory Visit (INDEPENDENT_AMBULATORY_CARE_PROVIDER_SITE_OTHER): Payer: BLUE CROSS/BLUE SHIELD | Admitting: Physician Assistant

## 2017-12-28 ENCOUNTER — Encounter (INDEPENDENT_AMBULATORY_CARE_PROVIDER_SITE_OTHER): Payer: Self-pay | Admitting: Physician Assistant

## 2017-12-28 ENCOUNTER — Encounter

## 2017-12-28 ENCOUNTER — Other Ambulatory Visit: Payer: Self-pay

## 2017-12-28 VITALS — BP 118/81 | HR 117 | Temp 97.9°F | Ht 68.5 in | Wt 151.4 lb

## 2017-12-28 DIAGNOSIS — R202 Paresthesia of skin: Secondary | ICD-10-CM

## 2017-12-28 DIAGNOSIS — M25561 Pain in right knee: Secondary | ICD-10-CM

## 2017-12-28 DIAGNOSIS — E1142 Type 2 diabetes mellitus with diabetic polyneuropathy: Secondary | ICD-10-CM | POA: Diagnosis not present

## 2017-12-28 DIAGNOSIS — Z794 Long term (current) use of insulin: Secondary | ICD-10-CM

## 2017-12-28 DIAGNOSIS — G8929 Other chronic pain: Secondary | ICD-10-CM

## 2017-12-28 MED ORDER — GABAPENTIN 600 MG PO TABS: 1200.0000 mg | ORAL_TABLET | Freq: Three times a day (TID) | ORAL | 5 refills | Status: DC

## 2017-12-28 MED ORDER — DULOXETINE HCL 60 MG PO CPEP: 60.0000 mg | ORAL_CAPSULE | Freq: Every day | ORAL | 5 refills | Status: DC

## 2017-12-28 MED FILL — DULoxetine HCL 60 MG CPEP: 60 | 30 days supply | Qty: 30 | Fill #3

## 2017-12-28 NOTE — Patient Instructions (Signed)
Paresthesia Paresthesia is a burning or prickling feeling. This feeling can happen in any part of the body. It often happens in the hands, arms, legs, or feet. Usually, it is not painful. In most cases, the feeling goes away in a short time and is not a sign of a serious problem. Follow these instructions at home:  Avoid drinking alcohol.  Try massage or needle therapy (acupuncture) to help with your problems.  Keep all follow-up visits as told by your doctor. This is important. Contact a doctor if:  You keep on having episodes of paresthesia.  Your burning or prickling feeling gets worse when you walk.  You have pain or cramps.  You feel dizzy.  You have a rash. Get help right away if:  You feel weak.  You have trouble walking or moving.  You have problems speaking, understanding, or seeing.  You feel confused.  You cannot control when you pee (urinate) or poop (bowel movement).  You lose feeling (numbness) after an injury.  You pass out (faint). This information is not intended to replace advice given to you by your health care provider. Make sure you discuss any questions you have with your health care provider. Document Released: 01/16/2008 Document Revised: 07/11/2015 Document Reviewed: 01/29/2014 Elsevier Interactive Patient Education  2018 [employer] Inc[employer]  

## 2017-12-28 NOTE — Progress Notes (Signed)
Subjective:  Patient ID: Kyle Novak, male    DOB: Aug 06, 1965  Age: 52 y.o. MRN: [id number]  CC: f/u knee pain  HPI PILAR CORRALES a 52 y.o.malewith a medical history of alcohol abuse, DM2, diabetic polyneuropathy, bilateral foot drop, HTN, and lesion of lumbosacral nerve root presents to f/u on right knee pain. Pt was suspected of having an ACL vs PCL tear. MR of right knee ordered which revealed a normal knee. Says right knee continues to swell and is tender around the patella. Previous work up includes normal/negative ANA, CRP, and ESR. Was referred to pain clinic but rejected due to having family planning medicaid. Says he continues with pain from the groin to the distal anterior thigh. Feels a burning sensation, like the "sensitive skin after a blister is popped". Has been to neurology which ordered an MR Lumbar spine and C spine. 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 Does not have a follow up appointment yet with neurology. Does not endorse any other symptoms or complaints.     Outpatient Medications Prior to Visit  Medication Sig Dispense Refill  . amLODipine (NORVASC) 10 MG tablet Take 1 tablet (10 mg total) by mouth daily. 30 tablet 5  . DULoxetine (CYMBALTA) 60 MG capsule Take 1 capsule (60 mg total) by mouth daily. 30 capsule 5  . gabapentin (NEURONTIN) 300 MG capsule Take 2 capsules (600 mg total) by mouth 3 (three) times daily. 180 capsule 5  . ibuprofen (ADVIL,MOTRIN) 200 MG tablet Take 200 mg by mouth every 6 (six) hours as needed for moderate pain.    Marland Kitchen losartan (COZAAR) 50 MG tablet Take 1 tablet (50 mg total) by mouth daily. 30 tablet 5  . Multiple Vitamin (MULITIVITAMIN WITH MINERALS) TABS Take 1 tablet by mouth daily.    . TRESIBA FLEXTOUCH 200 UNIT/ML SOPN Inject 24 Units into the skin daily. 3 pen 5  . Menthol, Topical Analgesic, (ICY HOT EX) Apply 1 application topically daily as needed (pain).    . insulin degludec  (TRESIBA FLEXTOUCH) 100 UNIT/ML SOPN FlexTouch Pen Inject 0.24 mLs (24 Units total) into the skin daily. (Patient not taking: Reported on 11/10/2017) 1 pen 1   Facility-Administered Medications Prior to Visit  Medication Dose Route Frequency Provider Last Rate Last Dose  . acetaminophen (TYLENOL) tablet 650 mg  650 mg Oral Q4H PRN Lorriane Shire, MD         ROS Review of Systems  Constitutional: Negative for chills, fever and malaise/fatigue.  Eyes: Negative for blurred vision.  Respiratory: Negative for shortness of breath.   Cardiovascular: Negative for chest pain and palpitations.  Gastrointestinal: Negative for abdominal pain and nausea.  Genitourinary: Negative for dysuria and hematuria.  Musculoskeletal: Positive for back pain and joint pain. Negative for myalgias.  Skin: Negative for rash.  Neurological: Negative for tingling and headaches.       Burning of right thigh  Psychiatric/Behavioral: Negative for depression. The patient is not nervous/anxious.     Objective:  BP 118/81 (BP Location: Left Arm, Patient Position: Sitting, Cuff Size: Normal)   Pulse (!) 117   Temp 97.9 F (36.6 C) (Oral)   Ht 5' 8.5" (1.74 m)   Wt 151 lb 6.4 oz (68.7 kg)   SpO2 96%   BMI 22.69 kg/m   BP/Weight 12/28/2017 11/10/2017 2/44/0102  Systolic BP 725 366 440  Diastolic BP 81 96 94  Wt. (Lbs) 151.4 148.2 148.6  BMI 22.69 22.21 22.27  Physical Exam  Constitutional: He is oriented to person, place, and time.  Well developed, well nourished, NAD, polite  HENT:  Head: Normocephalic and atraumatic.  Eyes: No scleral icterus.  Neck: Normal range of motion. Neck supple. No thyromegaly present.  Cardiovascular: Normal rate, regular rhythm and normal heart sounds.  Pulmonary/Chest: Effort normal and breath sounds normal.  Musculoskeletal: He exhibits no edema.  Right knee mildly edematous. No erythema or increased warmth.   Neurological: He is alert and oriented to person, place, and  time.  Antalgic gait favoring right knee.  Skin: Skin is warm and dry. No rash noted. No erythema. No pallor.  Psychiatric: He has a normal mood and affect. His behavior is normal. Thought content normal.  Vitals reviewed.    Assessment & Plan:    1. Chronic pain of right knee - Urine cytology ancillary only; Future - Uric Acid - Increase gabapentin (NEURONTIN) 600 MG tablet; Take 2 tablets (1,200 mg total) by mouth 3 (three) times daily.  Dispense: 180 tablet; Refill: 5 - VAS [place] LOWER EXTREMITY VENOUS (DVT); Future  2. Diabetic polyneuropathy associated with type 2 diabetes mellitus (HCC) - Refill DULoxetine (CYMBALTA) 60 MG capsule; Take 1 capsule (60 mg total) by mouth daily.  Dispense: 30 capsule; Refill: 5  3. Paresthesia - Pt has been advised to f/u with neurology sooner than his normal f/u due to small masses on Lumbar and T spine.   Meds ordered this encounter  Medications  . gabapentin (NEURONTIN) 600 MG tablet    Sig: Take 2 tablets (1,200 mg total) by mouth 3 (three) times daily.    Dispense:  180 tablet    Refill:  5    Order Specific Question:   Supervising Provider    Answer:   Charlott Rakes [[id number]]  . DULoxetine (CYMBALTA) 60 MG capsule    Sig: Take 1 capsule (60 mg total) by mouth daily.    Dispense:  30 capsule    Refill:  5    Order Specific Question:   Supervising Provider    Answer:   Charlott Rakes [[id number]]    Follow-up: Return in about 3 months (around 03/30/2018) for DM2.   [name] PA

## 2017-12-29 ENCOUNTER — Telehealth (INDEPENDENT_AMBULATORY_CARE_PROVIDER_SITE_OTHER): Payer: Self-pay

## 2017-12-29 LAB — URIC ACID: Uric Acid: 3.5 mg/dL — ABNORMAL LOW (ref 3.7–8.6)

## 2017-12-29 NOTE — Telephone Encounter (Signed)
-----   Message from [name], PA-C sent at 12/29/2017 12:10 PM EST ----- Uric acid is at normal level, symptoms most likely not due to gout. Awaiting gonorrhea testing.

## 2017-12-29 NOTE — Telephone Encounter (Signed)
Patient is aware that uric acid level is normal, symptoms not likely due to gout. Still awaiting gonorrhea testing results. Will call once available. Nat Christen, CMA

## 2018-01-03 ENCOUNTER — Telehealth (INDEPENDENT_AMBULATORY_CARE_PROVIDER_SITE_OTHER): Payer: Self-pay | Admitting: Physician Assistant

## 2018-01-03 ENCOUNTER — Other Ambulatory Visit (INDEPENDENT_AMBULATORY_CARE_PROVIDER_SITE_OTHER): Payer: Self-pay | Admitting: Physician Assistant

## 2018-01-03 DIAGNOSIS — M21371 Foot drop, right foot: Secondary | ICD-10-CM

## 2018-01-03 NOTE — Telephone Encounter (Signed)
Patient is aware and has already been scheduled with [facility]. Nat Christen, CMA

## 2018-01-03 NOTE — Telephone Encounter (Signed)
Patient called wanting to know if there was a place where he could get fitted for his foot brace. States that he has " foot drop" and was given braces in February when he was in the hospital to help with his condition. Patient states that they are to bulky/big and are tearing his shoes.  Please Advice [phone]  Thank you Kyle Novak

## 2018-01-03 NOTE — Telephone Encounter (Signed)
FWD to PCP. Tempestt S Roberts, CMA  

## 2018-01-03 NOTE — Telephone Encounter (Signed)
I have made podiatry referral to evaluate him for foot drop brace.

## 2018-01-07 ENCOUNTER — Ambulatory Visit (HOSPITAL_COMMUNITY)
Admission: RE | Admit: 2018-01-07 | Discharge: 2018-01-07 | Disposition: A | Discharge status: Home / Self Care | Payer: BLUE CROSS/BLUE SHIELD | Source: Ambulatory Visit | Attending: Physician Assistant | Admitting: Physician Assistant

## 2018-01-07 ENCOUNTER — Telehealth (INDEPENDENT_AMBULATORY_CARE_PROVIDER_SITE_OTHER): Payer: Self-pay | Admitting: Physician Assistant

## 2018-01-07 DIAGNOSIS — M25561 Pain in right knee: Secondary | ICD-10-CM | POA: Insufficient documentation

## 2018-01-07 DIAGNOSIS — G8929 Other chronic pain: Secondary | ICD-10-CM | POA: Insufficient documentation

## 2018-01-07 NOTE — Progress Notes (Signed)
Right lower extremity venous duplex has been completed. Negative for DVT. Results were given to Peter Congo at [employer] [facility] office.  01/07/18 10:53 AM Carlos Levering RVT

## 2018-01-07 NOTE — Telephone Encounter (Signed)
Patient negative for DVT. Nat Christen, CMA

## 2018-01-07 NOTE — Telephone Encounter (Signed)
Marya Amsler from [facility] called to give results and stated that patient was negative for DBT.  Thank you Emmit Pomfret

## 2018-01-10 ENCOUNTER — Telehealth (INDEPENDENT_AMBULATORY_CARE_PROVIDER_SITE_OTHER): Payer: Self-pay | Admitting: *Deleted

## 2018-01-10 MED FILL — GABAPENTIN 600 MG TABLET: 600 | 30 days supply | Qty: 180 | Fill #0

## 2018-01-10 NOTE — Telephone Encounter (Signed)
Message sent to [place] on a result note.

## 2018-01-10 NOTE — Telephone Encounter (Signed)
Patient verified DOB Patient is aware of DVT not being noted. Patient still complains of intermittent swelling and pain. Patient has appointment on 12/2 with foot and ankle and 03/30/17 with PCP for follow up. No further questions at this time.

## 2018-01-10 NOTE — Telephone Encounter (Signed)
-----   Message from [name], PA-C sent at 01/10/2018 10:42 AM EST ----- There is no evidence of deep vein thrombosis in the lower extremity. No cystic structure found in the popliteal fossa.

## 2018-01-17 ENCOUNTER — Ambulatory Visit (INDEPENDENT_AMBULATORY_CARE_PROVIDER_SITE_OTHER): Payer: BLUE CROSS/BLUE SHIELD | Admitting: Podiatry

## 2018-01-17 ENCOUNTER — Encounter: Payer: Self-pay | Admitting: Podiatry

## 2018-01-17 ENCOUNTER — Ambulatory Visit (INDEPENDENT_AMBULATORY_CARE_PROVIDER_SITE_OTHER): Payer: BLUE CROSS/BLUE SHIELD

## 2018-01-17 ENCOUNTER — Other Ambulatory Visit: Payer: Self-pay | Admitting: Podiatry

## 2018-01-17 VITALS — BP 127/87 | HR 98 | Resp 16

## 2018-01-17 DIAGNOSIS — M79671 Pain in right foot: Secondary | ICD-10-CM

## 2018-01-17 DIAGNOSIS — M76821 Posterior tibial tendinitis, right leg: Secondary | ICD-10-CM

## 2018-01-17 DIAGNOSIS — M21371 Foot drop, right foot: Secondary | ICD-10-CM | POA: Diagnosis not present

## 2018-01-17 DIAGNOSIS — M79672 Pain in left foot: Secondary | ICD-10-CM

## 2018-01-17 DIAGNOSIS — M21372 Foot drop, left foot: Secondary | ICD-10-CM

## 2018-01-17 DIAGNOSIS — M76822 Posterior tibial tendinitis, left leg: Secondary | ICD-10-CM

## 2018-01-17 NOTE — Progress Notes (Signed)
   Subjective:    Patient ID: Kyle Novak, male    DOB: 1965-08-28, 52 y.o.   MRN: [id number]  HPI    Review of Systems  All other systems reviewed and are negative.      Objective:   Physical Exam        Assessment & Plan:

## 2018-01-18 MED FILL — LOSARTAN POTASSIUM 50 MG TA: 50 | 30 days supply | Qty: 30 | Fill #3

## 2018-01-18 MED FILL — AMLODIPINE BESYLATE 10 MG T: 10 | 30 days supply | Qty: 30 | Fill #3

## 2018-01-19 ENCOUNTER — Ambulatory Visit: Payer: BLUE CROSS/BLUE SHIELD | Admitting: Orthotics

## 2018-01-19 DIAGNOSIS — M79672 Pain in left foot: Principal | ICD-10-CM

## 2018-01-19 DIAGNOSIS — M21371 Foot drop, right foot: Secondary | ICD-10-CM

## 2018-01-19 DIAGNOSIS — M76821 Posterior tibial tendinitis, right leg: Secondary | ICD-10-CM

## 2018-01-19 DIAGNOSIS — M21372 Foot drop, left foot: Secondary | ICD-10-CM

## 2018-01-19 DIAGNOSIS — M76822 Posterior tibial tendinitis, left leg: Secondary | ICD-10-CM

## 2018-01-19 DIAGNOSIS — M79671 Pain in right foot: Secondary | ICD-10-CM

## 2018-01-19 NOTE — Progress Notes (Signed)
Patient presents today for evaluation/casting for AFO brace (B/L).   Patient has hx of the following conditions: Gait instability,  Ankle instabilty,  Gait analysis done and patient displays abnormality of gait in both sagittial and frontal planes, and could benefit in aggressive ankle support.  Patient chose [place] brace w/ lace/speed laces.

## 2018-01-23 NOTE — Progress Notes (Signed)
Subjective:   Patient ID: Kyle Novak, male   DOB: 52 y.o.   MRN: [id number]   HPI Patient states that he has developed significant foot drop and flattening of his arch bilateral and his toes turned and he has had a stroke and is in poor health and diabetes that was out of control is now in normal control but it did leave him with a fax.  Patient smokes 1/2 pack/day does have over-the-counter foot drop braces which are not effectively giving him stability and he feels a great lack of balance and has had several falls   Review of Systems  All other systems reviewed and are negative.       Objective:  Physical Exam  Constitutional: He appears well-developed and well-nourished.  Cardiovascular: Intact distal pulses.  Pulmonary/Chest: Effort normal.  Musculoskeletal: Normal range of motion.  Neurological: He is alert.  Skin: Skin is warm.  Nursing note and vitals reviewed.   Vascular status intact neurological status was found to be diminished both sharp dull and vibratory with weakness of the anterior tear and extensor digitorum longus tendon group with patient having a shuffling gait and having lack of stability around his ankles with history of falls.     Assessment:  Significant issue associated with flatfoot deformity and weakness of his anterior tibial tendon extensor tendon complex bilateral     Plan:  H&P conditions reviewed discussed the falls he is experiencing his lack of strength and had ped orthotist to evaluate this patient with me.  Were both in agreement that he would benefit from a AFO brace with a hinged to try to give him more stability around the ankle and also help with a lack of anterior tibial and extensor digitorum muscle function.  Patient is scheduled for bracing bilateral F2 x-rays indicate that there is obvious signs of foot structural deformity arthritis but no stress fracture noted

## 2018-01-26 MED FILL — DULoxetine HCL 60 MG CPEP: 60 | 30 days supply | Qty: 30 | Fill #4

## 2018-02-10 MED FILL — GABAPENTIN 600 MG TABLET: 600 | 30 days supply | Qty: 180 | Fill #1

## 2018-02-10 MED FILL — TRESIBA FLEXTOUCH 200 UNITS: 200 | 75 days supply | Qty: 9 | Fill #1

## 2018-02-14 MED FILL — AMLODIPINE BESYLATE 10 MG T: 10 | 30 days supply | Qty: 30 | Fill #4

## 2018-02-14 MED FILL — LOSARTAN POTASSIUM 50 MG TA: 50 | 30 days supply | Qty: 30 | Fill #4

## 2018-02-15 ENCOUNTER — Ambulatory Visit (INDEPENDENT_AMBULATORY_CARE_PROVIDER_SITE_OTHER): Payer: BLUE CROSS/BLUE SHIELD | Admitting: Orthotics

## 2018-02-15 DIAGNOSIS — D497 Neoplasm of unspecified behavior of endocrine glands and other parts of nervous system: Secondary | ICD-10-CM

## 2018-02-15 DIAGNOSIS — E119 Type 2 diabetes mellitus without complications: Secondary | ICD-10-CM | POA: Diagnosis not present

## 2018-02-15 DIAGNOSIS — M21372 Foot drop, left foot: Secondary | ICD-10-CM

## 2018-02-15 DIAGNOSIS — M76821 Posterior tibial tendinitis, right leg: Secondary | ICD-10-CM

## 2018-02-15 DIAGNOSIS — M21371 Foot drop, right foot: Secondary | ICD-10-CM | POA: Diagnosis not present

## 2018-02-15 DIAGNOSIS — M76822 Posterior tibial tendinitis, left leg: Secondary | ICD-10-CM | POA: Diagnosis not present

## 2018-02-15 NOTE — Progress Notes (Signed)
Patient  came in to pick up b/l [place] brace w/ dorsi assist springs.  The brace fit well and immediately patient's  gait approved.  The brace provided desired m-l stability in frontal/transverse planes and aided in dorsiflexion in saggital plane. Patient was able to don and doff brace with minimal difficulty.  Overall patient pleased with fit and functionality of brace.

## 2018-02-25 MED FILL — DULoxetine HCL 60 MG CPEP: 60 | 30 days supply | Qty: 30 | Fill #5

## 2018-03-14 MED FILL — LOSARTAN POTASSIUM 50 MG TA: 50 | 30 days supply | Qty: 30 | Fill #5

## 2018-03-14 MED FILL — GABAPENTIN 600 MG TABLET: 600 | 30 days supply | Qty: 180 | Fill #2

## 2018-03-14 MED FILL — AMLODIPINE BESYLATE 10 MG T: 10 | 30 days supply | Qty: 30 | Fill #5

## 2018-03-15 ENCOUNTER — Encounter (INDEPENDENT_AMBULATORY_CARE_PROVIDER_SITE_OTHER): Payer: Self-pay | Admitting: Family Medicine

## 2018-03-15 ENCOUNTER — Other Ambulatory Visit: Payer: Self-pay

## 2018-03-15 ENCOUNTER — Ambulatory Visit (INDEPENDENT_AMBULATORY_CARE_PROVIDER_SITE_OTHER): Payer: Medicaid Other | Admitting: Family Medicine

## 2018-03-15 VITALS — BP 116/76 | HR 86 | Temp 97.9°F | Ht 68.5 in | Wt 161.0 lb

## 2018-03-15 DIAGNOSIS — G8929 Other chronic pain: Secondary | ICD-10-CM

## 2018-03-15 DIAGNOSIS — M25561 Pain in right knee: Secondary | ICD-10-CM

## 2018-03-15 DIAGNOSIS — M25461 Effusion, right knee: Secondary | ICD-10-CM | POA: Diagnosis not present

## 2018-03-15 MED ORDER — DICLOFENAC SODIUM 1 % TD GEL: 4.0000 g | Freq: Four times a day (QID) | TRANSDERMAL | 6 refills | Status: DC

## 2018-03-15 MED ORDER — PREDNISONE 20 MG PO TABS: 20.0000 mg | ORAL_TABLET | Freq: Every day | ORAL | 0 refills | Status: DC

## 2018-03-15 MED ORDER — TRAMADOL HCL 50 MG PO TABS: 50.0000 mg | ORAL_TABLET | Freq: Three times a day (TID) | ORAL | 0 refills | Status: DC | PRN

## 2018-03-15 MED FILL — traMADol HCL 50 MG TABS: 50 | 7 days supply | Qty: 21 | Fill #0

## 2018-03-15 MED FILL — predniSONE 20 MG TABS: 20 | 10 days supply | Qty: 10 | Fill #0

## 2018-03-15 NOTE — Progress Notes (Signed)
Number thank you for go get

## 2018-03-15 NOTE — Progress Notes (Signed)
Subjective:    Patient ID: Kyle Novak, male    DOB: Dec 31, 1965, 53 y.o.   MRN: [id number]  HPI      53 yo male who is seen secondary to the complaint of recurrent knee pain since falling about 6 months ago.  Patient reports a history of peripheral neuropathy and bilateral foot drop.  Patient has had issues with his right knee suddenly giving away.  Patient states that he was walking at his home down the hallway and patient fell backwards with his right knee bent and his leg underneath him with his right heel touching his buttock.  Patient states that his girlfriend witnessed the fall and had to help him get back up.  Patient states that since that time he has had recurrent pain and swelling in his right knee.  Patient states that he was referred to orthopedics and the orthopedic doctor looked at his knee and told the patient that he could not do anything about the patient's knee until the patient had an MRI.  Patient states that the doctor had initially mentioned drawing fluid off of his knee prior to looking at the knee and deciding that patient needed an MRI.      Patient reports that he has constant pain in his right knee that is between 8-9.  Patient states that he has had no relief of the pain with Tylenol or Motrin.  Patient states that he was also given Tylenol 3 by his provider here at this office which did not help.  Patient states that the only thing that has helped his tramadol which he believes the orthopedic doctor prescribed which does decrease his level of pain.  Patient also feels as if he has tenderness to touch over the muscles on the medial inner thigh above the knee and pain on the inside of the right knee.  Patient also has stabbing pain on the outside of the right knee.  Patient states that his knee is often swollen.  Patient does feel at times that the knee is warm to touch but has not seen any redness to the knee.  Past Medical History:  Diagnosis Date  . Diabetes mellitus  without complication ([place])   . Hypertension    Past Surgical History:  Procedure Laterality Date  . NO PAST SURGERIES     Social History   Tobacco Use  . Smoking status: Current Every Day Smoker    Packs/day: 0.50    Types: Cigarettes  . Smokeless tobacco: Never Used  Substance Use Topics  . Alcohol use: Yes    Comment: OCCASIONAL  . Drug use: No  No Known Allergies    Review of Systems  Constitutional: Positive for fatigue. Negative for chills and fever.  Respiratory: Negative for shortness of breath and stridor.   Cardiovascular: Negative for chest pain, palpitations and leg swelling.  Gastrointestinal: Negative for abdominal pain, blood in stool, constipation, diarrhea and nausea.  Endocrine: Negative for polydipsia, polyphagia and polyuria.  Genitourinary: Negative for flank pain and frequency.  Musculoskeletal: Positive for arthralgias, gait problem and joint swelling.  Neurological: Positive for weakness and numbness. Negative for dizziness and headaches.       Objective:   Physical Exam BP 116/76 (BP Location: Left Arm, Patient Position: Sitting, Cuff Size: Normal)   Pulse 86   Temp 97.9 F (36.6 C) (Oral)   Ht 5' 8.5" (1.74 m)   Wt 161 lb (73 kg)   SpO2 96%   BMI 24.12  kg/m  Nurse's notes and vital signs reviewed General-well-nourished, well-developed but thin male in no acute distress.  Patient is sitting on exam chair.  Patient has his cane leaned against the nearby wall.  Patient initially wearing a thin fabric knee brace which he removed for exam.  Patient with bilateral custom braces on the ankles/feet Lungs-clear to auscultation bilaterally Cardiovascular-regular rate and rhythm Abdomen-soft, nontender Musculoskeletal- patient does have some mild visible enlargement/swelling of the right knee as opposed to the left.  Patient with mild effusion of the right lateral upper portion of the knee as well as the inferior portion of the knee with some crepitation  at the inferior kneecap/patellar tendon.  Patient with right medial knee joint line tenderness as well as tenderness at the insertion of the medial quadriceps and tenderness along the right medial inner thigh proximal to the right knee patient does have some mild increased warmth along the right lateral upper patella EXT- no edema Psych- normal mood and judgement        Assessment & Plan:  1. Chronic pain of right knee Patient with complaint of chronic pain of the right knee status post a fall at home which he believes was about 6 months ago.  Patient has had x-rays on 08/27/2017 of the right knee which were normal and patient has had MRI on 11/14/2017 of the right knee which was also normal.  On exam, patient does have reproducible pain, effusion and possible ligament/muscle strain status post fall.  Patient is being referred back to orthopedics.  Prescription for diclofenac gel to use up to four times per day for pain and inflammation as well as a RX for Tramadol for pain and referral back to Orthopedics.  Patient is also encouraged to use a more supportive knee brace which he reports that he does have at hom,e in order to prevent future falls. - traMADol (ULTRAM) 50 MG tablet; Take 1 tablet (50 mg total) by mouth every 8 (eight) hours as needed for moderate pain.  Dispense: 90 tablet; Refill: 0 - diclofenac sodium (VOLTAREN) 1 % GEL; Apply 4 g topically 4 (four) times daily. For knee pain and swelling  Dispense: 2 Tube; Refill: 6 - predniSONE (DELTASONE) 20 MG tablet; Take 1 tablet (20 mg total) by mouth daily with breakfast. Eat before taking the medication  Dispense: 10 tablet; Refill: 0 - Ambulatory referral to Orthopedic Surgery  2. Effusion of right knee joint Patient with a right knee effusion and will place patient on a short prednisone taper for inflammation and swelling. Patient has had a negative Uric acid level recently. Follow-up with Ortho.  - traMADol (ULTRAM) 50 MG tablet; Take 1  tablet (50 mg total) by mouth every 8 (eight) hours as needed for moderate pain.  Dispense: 90 tablet; Refill: 0 - diclofenac sodium (VOLTAREN) 1 % GEL; Apply 4 g topically 4 (four) times daily. For knee pain and swelling  Dispense: 2 Tube; Refill: 6 - predniSONE (DELTASONE) 20 MG tablet; Take 1 tablet (20 mg total) by mouth daily with breakfast. Eat before taking the medication  Dispense: 10 tablet; Refill: 0 - Ambulatory referral to Orthopedic Surgery  An After Visit Summary was printed and given to the patient.  Allergies as of 03/15/2018   No Known Allergies     Medication List       Accurate as of March 15, 2018  2:39 PM. Always use your most recent med list.        amLODipine 10 MG  tablet Commonly known as:  NORVASC Take 1 tablet (10 mg total) by mouth daily.   diclofenac sodium 1 % Gel Commonly known as:  VOLTAREN Apply 4 g topically 4 (four) times daily. For knee pain and swelling   DULoxetine 60 MG capsule Commonly known as:  CYMBALTA Take 1 capsule (60 mg total) by mouth daily.   gabapentin 600 MG tablet Commonly known as:  NEURONTIN Take 2 tablets (1,200 mg total) by mouth 3 (three) times daily.   ICY HOT EX Apply 1 application topically daily as needed (pain).   losartan 50 MG tablet Commonly known as:  COZAAR Take 1 tablet (50 mg total) by mouth daily.   multivitamin with minerals Tabs tablet Take 1 tablet by mouth daily.   predniSONE 20 MG tablet Commonly known as:  DELTASONE Take 1 tablet (20 mg total) by mouth daily with breakfast. Eat before taking the medication   traMADol 50 MG tablet Commonly known as:  ULTRAM Take 1 tablet (50 mg total) by mouth every 8 (eight) hours as needed for moderate pain.   TRESIBA FLEXTOUCH 200 UNIT/ML Sopn Generic drug:  Insulin Degludec Inject 24 Units into the skin daily.      Return if symptoms worsen or fail to improve, for as needed; keep scheduled f/u.

## 2018-03-22 ENCOUNTER — Ambulatory Visit (INDEPENDENT_AMBULATORY_CARE_PROVIDER_SITE_OTHER): Payer: BLUE CROSS/BLUE SHIELD | Admitting: Orthopaedic Surgery

## 2018-03-22 ENCOUNTER — Ambulatory Visit (INDEPENDENT_AMBULATORY_CARE_PROVIDER_SITE_OTHER): Payer: BLUE CROSS/BLUE SHIELD

## 2018-03-22 ENCOUNTER — Encounter (INDEPENDENT_AMBULATORY_CARE_PROVIDER_SITE_OTHER): Payer: Self-pay | Admitting: Orthopaedic Surgery

## 2018-03-22 VITALS — Ht 68.5 in | Wt 161.0 lb

## 2018-03-22 DIAGNOSIS — M25562 Pain in left knee: Secondary | ICD-10-CM

## 2018-03-22 DIAGNOSIS — G8929 Other chronic pain: Secondary | ICD-10-CM

## 2018-03-22 DIAGNOSIS — M25561 Pain in right knee: Secondary | ICD-10-CM

## 2018-03-22 DIAGNOSIS — M25551 Pain in right hip: Secondary | ICD-10-CM

## 2018-03-22 MED ORDER — LIDOCAINE HCL 1 % IJ SOLN
2.0000 mL | INTRAMUSCULAR | Status: AC | PRN
  Administered 2018-03-22: 2 mL

## 2018-03-22 MED ORDER — KETOROLAC TROMETHAMINE 30 MG/ML IJ SOLN: 30.0000 mg | Freq: Once | INTRAMUSCULAR | Status: AC

## 2018-03-22 MED ORDER — BUPIVACAINE HCL 0.25 % IJ SOLN
2.0000 mL | INTRAMUSCULAR | Status: AC | PRN
  Administered 2018-03-22: 2 mL via INTRA_ARTICULAR

## 2018-03-22 NOTE — Progress Notes (Signed)
Office Visit Note   Patient: Kyle Novak           Date of Birth: November 02, 1965           MRN: [id number] Visit Date: 03/22/2018              Requested by: Antony Blackbird, MD [place], [place] [address] PCP: [name], PA-C   Assessment & Plan: Visit Diagnoses:  1. Chronic pain of right knee   2. Left knee pain, unspecified chronicity     Plan: Impression is right knee pain with questionable peripheral neuropathy component versus hip pathology.  We injected the right knee with lidocaine and Marcaine today.  He felt moderately better during the anesthetic phase.  We then injected the right knee with Toradol.  If he is not improved over the next few weeks he will call and let us know we will get an MRI of the right hip.  If this is normal we will then refer him back to [facility].  Total face to face encounter time was greater than 45 minutes and over half of this time was spent in counseling and/or coordination of care.  Follow-Up Instructions: Return if symptoms worsen or fail to improve.   Orders:  Orders Placed This Encounter  Procedures  . Large Joint Inj: R knee  . XR KNEE 3 VIEW RIGHT  . XR HIP UNILAT W OR W/O PELVIS 1V RIGHT   Meds ordered this encounter  Medications  . ketorolac (TORADOL) 30 MG/ML injection 30 mg      Procedures: Large Joint Inj: R knee on 03/22/2018 3:02 PM Indications: pain Details: 22 G needle, anterolateral approach Medications: 2 mL lidocaine 1 %; 2 mL bupivacaine 0.25 %      Clinical Data: No additional findings.   Subjective: Chief Complaint  Patient presents with  . Left Knee - Pain    HPI patient is a pleasant 53 year old gentleman who presents to our clinic today with right knee pain.  This began approximately 6 months ago when he sustained a hyperflexion injury to the right knee.  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  He was seen for this for x-rays and subsequent MRI  was obtained.  MRI of the right knee from September 2019 was negative.  The patient comes in today for further evaluation and treatment recommendation.  The pain he has is primarily to the right knee.  He does note occasional lower back, groin and anterior thigh pain.  He describes this as a constant ache with occasional swelling to the right knee.  He does note weakness to the right leg.  He gets mild relief of pain and swelling wearing a knee sleeve.  He has previously been worked up and had labs to include uric acid, ANA and venous Doppler which were all negative.  No recent bug bites.  No fevers or chills.  No previous injection to the right knee.  He is a type II diabetic and has a history of a spinal cord mass which he is being followed by Dr. Bonnita Levan at [facility].  Review of Systems as detailed in HPI.  All others reviewed and are negative.   Objective: Vital Signs: Ht 5' 8.5" (1.74 m)   Wt 161 lb (73 kg)   BMI 24.12 kg/m   Physical Exam well-developed and well-nourished gentleman in no acute distress.  Alert and oriented x3.  Ortho Exam examination of his right lower  extremity reveals negative logroll.  Positive straight leg raise.  Right knee shows no effusion.  Mild joint line tenderness.  Range of motion 0 to 95 degrees.  Cruciates and collaterals are stable.  No focal weakness.  Decreased sensation right foot.  He does have a drop foot.  Specialty Comments:  No specialty comments available.  Imaging: Xr Hip Unilat W Or W/o Pelvis 1v Right  Result Date: 03/22/2018 No acute or structural abnormalities  Xr Knee 3 View Right  Result Date: 03/22/2018 No acute or structural abnormalities    PMFS History: Patient Active Problem List   Diagnosis Date Noted  . Chronic pain of right knee 03/22/2018  . 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 Spinal cord mass ([place]) 03/16/2017  . Diabetes mellitus without complication (Kyle Novak)   . Hypertension    Past Medical History:    Diagnosis Date  . Diabetes mellitus without complication (Kyle Novak)   . Hypertension     Family History  Problem Relation Age of Onset  . Lung cancer Father     Past Surgical History:  Procedure Laterality Date  . NO PAST SURGERIES     Social History   Occupational History  . Not on file  Tobacco Use  . Smoking status: Current Every Day Smoker    Packs/day: 0.50    Types: Cigarettes  . Smokeless tobacco: Never Used  Substance and Sexual Activity  . Alcohol use: Yes    Comment: OCCASIONAL  . Drug use: No  . Sexual activity: Not on file

## 2018-03-28 MED FILL — DULoxetine HCL 60 MG CPEP: 60 | 30 days supply | Qty: 30 | Fill #0

## 2018-03-30 ENCOUNTER — Encounter (INDEPENDENT_AMBULATORY_CARE_PROVIDER_SITE_OTHER): Payer: Self-pay | Admitting: Primary Care

## 2018-03-30 ENCOUNTER — Ambulatory Visit (INDEPENDENT_AMBULATORY_CARE_PROVIDER_SITE_OTHER): Payer: Medicaid Other | Admitting: Primary Care

## 2018-03-30 ENCOUNTER — Other Ambulatory Visit: Payer: Self-pay

## 2018-03-30 VITALS — BP 123/87 | HR 103 | Temp 97.8°F | Ht 68.5 in | Wt 164.4 lb

## 2018-03-30 DIAGNOSIS — Z23 Encounter for immunization: Secondary | ICD-10-CM | POA: Diagnosis not present

## 2018-03-30 DIAGNOSIS — Z1211 Encounter for screening for malignant neoplasm of colon: Secondary | ICD-10-CM

## 2018-03-30 DIAGNOSIS — E1142 Type 2 diabetes mellitus with diabetic polyneuropathy: Secondary | ICD-10-CM

## 2018-03-30 DIAGNOSIS — I10 Essential (primary) hypertension: Secondary | ICD-10-CM

## 2018-03-30 DIAGNOSIS — F17209 Nicotine dependence, unspecified, with unspecified nicotine-induced disorders: Secondary | ICD-10-CM

## 2018-03-30 DIAGNOSIS — M25461 Effusion, right knee: Secondary | ICD-10-CM | POA: Diagnosis not present

## 2018-03-30 DIAGNOSIS — G8929 Other chronic pain: Secondary | ICD-10-CM

## 2018-03-30 DIAGNOSIS — M25561 Pain in right knee: Secondary | ICD-10-CM

## 2018-03-30 DIAGNOSIS — E119 Type 2 diabetes mellitus without complications: Secondary | ICD-10-CM

## 2018-03-30 LAB — GLUCOSE, POCT (MANUAL RESULT ENTRY): POC Glucose: 186 mg/dl — AB (ref 70–99)

## 2018-03-30 LAB — POCT GLYCOSYLATED HEMOGLOBIN (HGB A1C): Hemoglobin A1C: 5.3 % (ref 4.0–5.6)

## 2018-03-30 MED ORDER — DULOXETINE HCL 60 MG PO CPEP: 60.0000 mg | ORAL_CAPSULE | Freq: Every day | ORAL | 5 refills | Status: DC

## 2018-03-30 MED ORDER — LOSARTAN POTASSIUM 50 MG PO TABS: 50.0000 mg | ORAL_TABLET | Freq: Every day | ORAL | 5 refills | Status: DC

## 2018-03-30 MED ORDER — DICLOFENAC SODIUM 1 % TD GEL: 4.0000 g | Freq: Four times a day (QID) | TRANSDERMAL | 6 refills | Status: DC

## 2018-03-30 MED ORDER — GABAPENTIN 600 MG PO TABS: 1200.0000 mg | ORAL_TABLET | Freq: Three times a day (TID) | ORAL | 5 refills | Status: DC

## 2018-03-30 MED ORDER — AMLODIPINE BESYLATE 10 MG PO TABS: 10.0000 mg | ORAL_TABLET | Freq: Every day | ORAL | 5 refills | Status: DC

## 2018-03-30 MED FILL — traMADol HCL 50 MG TABS: 50 | 7 days supply | Qty: 21 | Fill #1

## 2018-03-30 NOTE — Patient Instructions (Signed)

## 2018-03-30 NOTE — Progress Notes (Signed)
5

## 2018-03-30 NOTE — Progress Notes (Signed)
Established Patient Office Visit  Subjective:  Patient ID: Kyle Novak, male    DOB: 06-Feb-1966  Age: 53 y.o. MRN: [id number]  CC:  Chief Complaint  Patient presents with  . Follow-up    DM    HPI Kyle Novak presents for management of diabetes. He has recently had right knee pain now followed by ortho Dr. Erlinda Hong. PMH of HTN , T2D and tobacco abuse .      Past Medical History:  Diagnosis Date  . Diabetes mellitus without complication ([place])   . Hypertension     Past Surgical History:  Procedure Laterality Date  . NO PAST SURGERIES      Family History  Problem Relation Age of Onset  . Lung cancer Father     Social History   Socioeconomic History  . Marital status: Single    Spouse name: Not on file  . Number of children: Not on file  . Years of education: Not on file  . Highest education level: Not on file  Occupational History  . Not on file  Social Needs  . Financial resource strain: Not on file  . Food insecurity:    Worry: Not on file    Inability: Not on file  . Transportation needs:    Medical: Not on file    Non-medical: Not on file  Tobacco Use  . Smoking status: Current Every Day Smoker    Packs/day: 0.50    Types: Cigarettes  . Smokeless tobacco: Never Used  Substance and Sexual Activity  . Alcohol use: Yes    Comment: OCCASIONAL  . Drug use: No  . Sexual activity: Not on file  Lifestyle  . Physical activity:    Days per week: Not on file    Minutes per session: Not on file  . Stress: Not on file  Relationships  . Social connections:    Talks on phone: Not on file    Gets together: Not on file    Attends religious service: Not on file    Active member of club or organization: Not on file    Attends meetings of clubs or organizations: Not on file    Relationship status: Not on file  . Intimate partner violence:    Fear of current or ex partner: Not on file    Emotionally abused: Not on file    Physically abused: Not on file     Forced sexual activity: Not on file  Other Topics Concern  . Not on file  Social History Narrative  . Not on file    Outpatient Medications Prior to Visit  Medication Sig Dispense Refill  . amLODipine (NORVASC) 10 MG tablet Take 1 tablet (10 mg total) by mouth daily. 30 tablet 5  . diclofenac sodium (VOLTAREN) 1 % GEL Apply 4 g topically 4 (four) times daily. For knee pain and swelling 2 Tube 6  . DULoxetine (CYMBALTA) 60 MG capsule Take 1 capsule (60 mg total) by mouth daily. 30 capsule 5  . gabapentin (NEURONTIN) 600 MG tablet Take 2 tablets (1,200 mg total) by mouth 3 (three) times daily. 180 tablet 5  . losartan (COZAAR) 50 MG tablet Take 1 tablet (50 mg total) by mouth daily. 30 tablet 5  . Multiple Vitamin (MULITIVITAMIN WITH MINERALS) TABS Take 1 tablet by mouth daily.    . TRESIBA FLEXTOUCH 200 UNIT/ML SOPN Inject 24 Units into the skin daily. 3 pen 5  . Menthol, Topical Analgesic, (ICY HOT EX) Apply  1 application topically daily as needed (pain).    . predniSONE (DELTASONE) 20 MG tablet Take 1 tablet (20 mg total) by mouth daily with breakfast. Eat before taking the medication 10 tablet 0  . traMADol (ULTRAM) 50 MG tablet Take 1 tablet (50 mg total) by mouth every 8 (eight) hours as needed for moderate pain. 90 tablet 0   Facility-Administered Medications Prior to Visit  Medication Dose Route Frequency Provider Last Rate Last Dose  . acetaminophen (TYLENOL) tablet 650 mg  650 mg Oral Q4H PRN Lorriane Shire, MD        No Known Allergies  ROS Review of Systems  Constitutional: Negative.   HENT: Negative.   Eyes: Negative.   Respiratory: Negative.   Cardiovascular: Negative.   Gastrointestinal: Negative.   Endocrine: Negative.   Genitourinary: Negative.   Musculoskeletal: Positive for arthralgias and gait problem.       Uses a cane and wears a knee brace  Skin: Negative.   Allergic/Immunologic: Negative.   Neurological: Positive for weakness and numbness.   Hematological: Negative.   Psychiatric/Behavioral: Negative.       Objective:    Physical Exam  Constitutional: He is oriented to person, place, and time. He appears well-nourished.  HENT:  Head: Normocephalic.  Eyes: Pupils are equal, round, and reactive to light. EOM are normal.  Neck: Normal range of motion. Neck supple.  Cardiovascular: Normal rate.  Pulmonary/Chest: Effort normal and breath sounds normal.  Abdominal: Bowel sounds are normal.  Musculoskeletal: Normal range of motion.  Neurological: He is oriented to person, place, and time.  Skin: Skin is warm and dry.    BP 123/87 (BP Location: Left Arm, Patient Position: Sitting, Cuff Size: Normal)   Pulse (!) 103   Temp 97.8 F (36.6 C) (Oral)   Ht 5' 8.5" (1.74 m)   Wt 164 lb 6.4 oz (74.6 kg)   SpO2 93%   BMI 24.63 kg/m  Wt Readings from Last 3 Encounters:  03/30/18 164 lb 6.4 oz (74.6 kg)  03/22/18 161 lb (73 kg)  03/15/18 161 lb (73 kg)     Health Maintenance Due  Topic Date Due  . PNEUMOCOCCAL POLYSACCHARIDE VACCINE AGE 54-64 HIGH RISK  12/31/1967  . FOOT EXAM  12/31/1975  . OPHTHALMOLOGY EXAM  12/31/1975  . COLONOSCOPY  12/31/2015    There are no preventive care reminders to display for this patient.  No results found for: TSH Lab Results  Component Value Date   WBC 10.2 06/25/2017   HGB 14.8 06/25/2017   HCT 44.1 06/25/2017   MCV 85.8 06/25/2017   PLT 320 06/25/2017   Lab Results  Component Value Date   NA 138 06/25/2017   K 4.1 06/25/2017   CO2 26 06/25/2017   GLUCOSE 263 (H) 06/25/2017   BUN 12 06/25/2017   CREATININE 0.79 06/25/2017   BILITOT 0.8 03/17/2017   ALKPHOS 95 03/17/2017   AST 21 03/17/2017   ALT 24 03/17/2017   PROT 7.9 10/11/2017   ALBUMIN 4.3 03/17/2017   CALCIUM 9.6 06/25/2017   ANIONGAP 9 06/25/2017   No results found for: CHOL No results found for: HDL No results found for: LDLCALC No results found for: TRIG No results found for: [facility] Lab Results   Component Value Date   HGBA1C 6.7 (A) 10/11/2017      Assessment & Plan:  Kyle Novak was seen today for follow-up.  Diagnoses and all orders for this visit:  Diabetes mellitus without complication ([place]) -  HgB A1c -     Glucose (CBG) -     Ambulatory referral to Ophthalmology Currently on oral and insulin   Hypertension, unspecified type -     amLODipine (NORVASC) 10 MG tablet; Take 1 tablet (10 mg total) by mouth daily. -     losartan (COZAAR) 50 MG tablet; Take 1 tablet (50 mg total) by mouth daily.  Chronic pain of right knee Followed by Dr. Erlinda Hong -     diclofenac sodium (VOLTAREN) 1 % GEL; Apply 4 g topically 4 (four) times daily. For knee pain and swelling -     gabapentin (NEURONTIN) 600 MG tablet; Take 2 tablets (1,200 mg total) by mouth 3 (three) times daily.  Diabetic polyneuropathy associated with type 2 diabetes mellitus (H[facility]) Caused foot drop. Woke up one morning unable to walk immediately went to ER and admitted to hosp.  Wears AFO bilaterally.   Colon cancer screening -     Fecal occult blood, imunochemical; Future  Tobacco use disorder, continuous 5-6 cigerettes a day he is weaning his self off slowly.   Other orders -     Pneumococcal polysaccharide vaccine 23-valent greater than or equal to 2yo subcutaneous/IM  Refill all medications   Follow-up: 1 week for [place] vaccine    Kerin Perna, NP

## 2018-04-06 ENCOUNTER — Ambulatory Visit (INDEPENDENT_AMBULATORY_CARE_PROVIDER_SITE_OTHER): Payer: Medicaid Other

## 2018-04-06 ENCOUNTER — Other Ambulatory Visit (INDEPENDENT_AMBULATORY_CARE_PROVIDER_SITE_OTHER): Payer: Self-pay

## 2018-04-06 DIAGNOSIS — Z23 Encounter for immunization: Secondary | ICD-10-CM

## 2018-04-06 DIAGNOSIS — Z1211 Encounter for screening for malignant neoplasm of colon: Secondary | ICD-10-CM

## 2018-04-07 LAB — FECAL OCCULT BLOOD, IMMUNOCHEMICAL: Fecal Occult Bld: NEGATIVE

## 2018-04-11 MED FILL — traMADol HCL 50 MG TABS: 50 | 7 days supply | Qty: 21 | Fill #2

## 2018-04-12 MED FILL — GABAPENTIN 600 MG TABLET: 600 | 30 days supply | Qty: 180 | Fill #3

## 2018-04-15 MED FILL — LOSARTAN POTASSIUM 50 MG TA: 50 | 30 days supply | Qty: 30 | Fill #0

## 2018-04-15 MED FILL — AMLODIPINE BESYLATE 10 MG T: 10 | 30 days supply | Qty: 30 | Fill #0

## 2018-04-25 MED FILL — DULoxetine HCL 60 MG CPEP: 60 | 30 days supply | Qty: 30 | Fill #1

## 2018-04-25 MED FILL — traMADol HCL 50 MG TABS: 50 | 7 days supply | Qty: 21 | Fill #3

## 2018-05-17 ENCOUNTER — Ambulatory Visit: Payer: BLUE CROSS/BLUE SHIELD | Admitting: Diagnostic Neuroimaging

## 2018-06-06 ENCOUNTER — Telehealth: Payer: Self-pay | Admitting: Primary Care

## 2018-06-06 NOTE — Telephone Encounter (Signed)
1) Medication(s) Requested (by name): TRESIBA FLEXTOUCH 200 UNIT/ML SOPN 2) Pharmacy of Choice: [facility] on [address] 3) Special Requests: Pt came stated that he needed [place] and [facility] stated that [facility] had it and he called while he was here and they said they need a prior authorization from Dr before its given to him at [facility]   Approved medications will be sent to the pharmacy, we will reach out if there is an issue.  Requests made after 3pm may not be addressed until the following business day!  If a patient is unsure of the name of the medication(s) please note and ask patient to call back when they are able to provide all info, do not send to responsible party until all information is available!

## 2018-06-07 ENCOUNTER — Other Ambulatory Visit (INDEPENDENT_AMBULATORY_CARE_PROVIDER_SITE_OTHER): Payer: Self-pay | Admitting: Family Medicine

## 2018-06-07 ENCOUNTER — Other Ambulatory Visit: Payer: Self-pay | Admitting: Pharmacist

## 2018-06-07 ENCOUNTER — Other Ambulatory Visit: Payer: Self-pay

## 2018-06-07 DIAGNOSIS — E119 Type 2 diabetes mellitus without complications: Secondary | ICD-10-CM

## 2018-06-07 MED ORDER — INSULIN GLARGINE 100 UNITS/ML SOLOSTAR PEN: 24.0000 [IU] | PEN_INJECTOR | Freq: Every day | SUBCUTANEOUS | 0 refills | Status: DC

## 2018-06-07 MED ORDER — INSULIN GLARGINE 100 UNIT/ML SOLOSTAR PEN: 24.0000 [IU] | PEN_INJECTOR | Freq: Every day | SUBCUTANEOUS | 1 refills | Status: DC

## 2018-06-07 NOTE — Telephone Encounter (Signed)
Please switch to Lantus or levemir due to insurance preference.

## 2018-06-27 ENCOUNTER — Other Ambulatory Visit (INDEPENDENT_AMBULATORY_CARE_PROVIDER_SITE_OTHER): Payer: Self-pay | Admitting: Family Medicine

## 2018-07-13 ENCOUNTER — Other Ambulatory Visit (INDEPENDENT_AMBULATORY_CARE_PROVIDER_SITE_OTHER): Payer: Self-pay | Admitting: Family Medicine

## 2018-07-13 NOTE — Telephone Encounter (Signed)
Pt called wanting a refill on gabapentin pt states that [facility] does not have anymore refills please follow up

## 2018-07-13 NOTE — Telephone Encounter (Signed)
RX written by Sharyn Lull on 03/30/18 was sent to [facility] and never filled, RX was transferred to [facility].

## 2018-07-17 ENCOUNTER — Other Ambulatory Visit: Payer: Self-pay | Admitting: Primary Care

## 2018-07-21 ENCOUNTER — Other Ambulatory Visit: Payer: Self-pay | Admitting: Primary Care

## 2018-07-21 DIAGNOSIS — G8929 Other chronic pain: Secondary | ICD-10-CM

## 2018-07-21 DIAGNOSIS — M25561 Pain in right knee: Secondary | ICD-10-CM

## 2018-07-21 NOTE — Telephone Encounter (Signed)
I will refer him to pain clinic gabapentin refilled

## 2018-07-22 NOTE — Telephone Encounter (Signed)
Plz make pt an appt

## 2018-07-26 ENCOUNTER — Telehealth: Payer: Self-pay | Admitting: *Deleted

## 2018-07-26 NOTE — Telephone Encounter (Signed)
Patient is already scheduled to see pain mgmt next week. No need for appointment. Nat Christen, CMA

## 2018-07-26 NOTE — Telephone Encounter (Signed)
Called patient and Lynnell Grain him if he still prefers in office visit we need to reschedule for July. He stated he did; we rescheduled. I explained in office check in procedure. He verbalized understanding, appreciation.

## 2018-07-27 ENCOUNTER — Ambulatory Visit: Payer: Medicaid Other | Admitting: Diagnostic Neuroimaging

## 2018-08-24 ENCOUNTER — Other Ambulatory Visit (INDEPENDENT_AMBULATORY_CARE_PROVIDER_SITE_OTHER): Payer: Self-pay | Admitting: Primary Care

## 2018-08-24 ENCOUNTER — Encounter: Payer: Self-pay | Admitting: Diagnostic Neuroimaging

## 2018-08-24 ENCOUNTER — Ambulatory Visit: Payer: Medicaid Other | Admitting: Diagnostic Neuroimaging

## 2018-08-24 ENCOUNTER — Other Ambulatory Visit: Payer: Self-pay | Admitting: Primary Care

## 2018-08-24 ENCOUNTER — Other Ambulatory Visit: Payer: Self-pay

## 2018-08-24 VITALS — BP 137/92 | HR 98 | Temp 97.5°F | Ht 68.5 in | Wt 178.2 lb

## 2018-08-24 DIAGNOSIS — G621 Alcoholic polyneuropathy: Secondary | ICD-10-CM | POA: Diagnosis not present

## 2018-08-24 DIAGNOSIS — E119 Type 2 diabetes mellitus without complications: Secondary | ICD-10-CM

## 2018-08-24 DIAGNOSIS — M25561 Pain in right knee: Secondary | ICD-10-CM

## 2018-08-24 DIAGNOSIS — G8929 Other chronic pain: Secondary | ICD-10-CM

## 2018-08-24 DIAGNOSIS — E1142 Type 2 diabetes mellitus with diabetic polyneuropathy: Secondary | ICD-10-CM

## 2018-08-24 MED ORDER — LANTUS SOLOSTAR 100 UNIT/ML ~~LOC~~ SOPN: 24.0000 [IU] | PEN_INJECTOR | Freq: Every day | SUBCUTANEOUS | 3 refills | Status: DC

## 2018-08-24 NOTE — Progress Notes (Signed)
[facility]  PATIENT: Kyle Novak DOB: 11/05/1965  REFERRING CLINICIAN: Ritta Slot, MD HISTORY FROM: patient, chart review REASON FOR VISIT: follow up    HISTORICAL  CHIEF COMPLAINT:  Chief Complaint  Patient presents with  . Diabetic polyneuropathy    rm 7 FU     HISTORY OF PRESENT ILLNESS:   UPDATE (08/24/18, VRP): Since last visit, doing diabetes and neuropathy are much improved.  However patient continues to have problems with his right leg, right knee pain and swelling.  He had a flareup a few weeks ago which has slightly improved.  UPDATE (11/05/17, VRP): Since last visit, doing poorly. Symptoms are progressive and severe. More pain, weakness, muscle wasting, weight loss. He injured his right knee and having more pain. No alleviating or aggravating factors. Tolerating gabapentin and cymbalta (x 1 month) but no major pain relief.  PRIOR HPI (03/31/17, VRP): 53 year old male with history of diabetes, high blood pressure, alcohol abuse, here for evaluation of lower extremity numbness, weakness, pain.  January 2018 patient was diagnosed with diabetes and hemoglobin A1c was 15.  In December 2018 patient started to have shooting electrical pains in his knees, thighs and calves.  Over 4 weeks symptoms continue to worsen, leading to bilateral foot drop, numbness in his feet and legs, numbness up to his thighs.  Now patient having some numbness and tingling in his fingers and hands.  Patient was referred to me for consult.  Before he could come for this appointment patient was admitted to the hospital due to numbness, weakness in his legs.  MRI of the lumbar spine showed a small intrathecal cystic enhancing lesion, possibly a schwannoma or ependymoma.  MRI of the brain, cervical thoracic spine were otherwise unremarkable.  This was felt to be an incidental finding.  Lumbar puncture showed normal protein, slightly elevated glucose, and no white blood cells.  Patient  was diagnosed with bilateral foot drop and neuropathy and recommended for outpatient follow-up.  Diabetes control has improved.  Patient is on insulin.  Last hemoglobin A1c was 7.6.  Patient has daily tobacco abuse, smoking 1 pack of cigarettes over 1-2 days.  Patient has almost daily alcohol use drinking 2-3 shots per day, sometimes more on the weekend.  Sometimes he goes 2-3 days without drinking.  This is been going on for at least 4-5 years.  He denies any withdrawal symptoms when he stops drinking.   REVIEW OF SYSTEMS: Full 14 system review of systems performed and negative with exception of: as per HPI.   ALLERGIES: No Known Allergies  HOME MEDICATIONS: Outpatient Medications Prior to Visit  Medication Sig Dispense Refill  . amLODipine (NORVASC) 10 MG tablet Take 1 tablet (10 mg total) by mouth daily. 30 tablet 5  . diclofenac sodium (VOLTAREN) 1 % GEL Apply 4 g topically 4 (four) times daily. For knee pain and swelling 2 Tube 6  . DULoxetine (CYMBALTA) 60 MG capsule Take 1 capsule (60 mg total) by mouth daily. 30 capsule 5  . gabapentin (NEURONTIN) 600 MG tablet Take 2 tablets (1,200 mg total) by mouth 3 (three) times daily. 180 tablet 5  . Insulin Glargine (LANTUS SOLOSTAR) 100 UNIT/ML Solostar Pen Inject 24 Units into the skin daily. 15 mL 1  . losartan (COZAAR) 50 MG tablet Take 1 tablet (50 mg total) by mouth daily. 30 tablet 5  . Multiple Vitamin (MULITIVITAMIN WITH MINERALS) TABS Take 1 tablet by mouth daily.    . traMADol (ULTRAM) 50 MG tablet  TAKE 1 (ONE) TABLET BY MOUTH ONCE OR TWICE A DAY AS NEEDED FOR PAIN     Facility-Administered Medications Prior to Visit  Medication Dose Route Frequency Provider Last Rate Last Dose  . acetaminophen (TYLENOL) tablet 650 mg  650 mg Oral Q4H PRN Lorriane Shire, MD        PAST MEDICAL HISTORY: Past Medical History:  Diagnosis Date  . Diabetes mellitus without complication ([place])   . Hypertension     PAST SURGICAL HISTORY: Past  Surgical History:  Procedure Laterality Date  . NO PAST SURGERIES    . WISDOM TOOTH EXTRACTION      FAMILY HISTORY: Family History  Problem Relation Age of Onset  . Lung cancer Father   . Heart failure Brother     SOCIAL HISTORY:  Social History   Socioeconomic History  . Marital status: Single    Spouse name: Not on file  . Number of children: Not on file  . Years of education: Not on file  . Highest education level: Not on file  Occupational History  . Not on file  Social Needs  . Financial resource strain: Not on file  . Food insecurity    Worry: Not on file    Inability: Not on file  . Transportation needs    Medical: Not on file    Non-medical: Not on file  Tobacco Use  . Smoking status: Current Every Day Smoker    Packs/day: 0.50    Types: Cigarettes  . Smokeless tobacco: Never Used  Substance and Sexual Activity  . Alcohol use: Yes    Comment: OCCASIONAL  . Drug use: No  . Sexual activity: Not on file  Lifestyle  . Physical activity    Days per week: Not on file    Minutes per session: Not on file  . Stress: Not on file  Relationships  . Social Herbalist on phone: Not on file    Gets together: Not on file    Attends religious service: Not on file    Active member of club or organization: Not on file    Attends meetings of clubs or organizations: Not on file    Relationship status: Not on file  . Intimate partner violence    Fear of current or ex partner: Not on file    Emotionally abused: Not on file    Physically abused: Not on file    Forced sexual activity: Not on file  Other Topics Concern  . Not on file  Social History Narrative  . Not on file     PHYSICAL EXAM  GENERAL EXAM/CONSTITUTIONAL: Vitals:  Vitals:   08/24/18 1456  BP: (!) 137/92  Pulse: 98  Temp: (!) 97.5 F (36.4 C)  Weight: 178 lb 3.2 oz (80.8 kg)  Height: 5' 8.5" (1.74 m)   Wt Readings from Last 8 Encounters:  08/24/18 178 lb 3.2 oz (80.8 kg)   03/30/18 164 lb 6.4 oz (74.6 kg)  03/22/18 161 lb (73 kg)  03/15/18 161 lb (73 kg)  12/28/17 151 lb 6.4 oz (68.7 kg)  11/10/17 148 lb 3.2 oz (67.2 kg)  11/05/17 148 lb 9.6 oz (67.4 kg)  10/11/17 148 lb 3.2 oz (67.2 kg)    Body mass index is 26.7 kg/m. No exam data present  Patient is in no distress; well developed, nourished and groomed; neck is supple  CARDIOVASCULAR:  Examination of carotid arteries is normal; no carotid bruits  Regular rate and rhythm,  no murmurs  Examination of peripheral vascular system by observation and palpation is normal  EYES:  Ophthalmoscopic exam of optic discs and posterior segments is normal; no papilledema or hemorrhages  MUSCULOSKELETAL:  Gait, strength, tone, movements noted in Neurologic exam below  NEUROLOGIC: MENTAL STATUS:  No flowsheet data found.  awake, alert, oriented to person, place and time  recent and remote memory intact  normal attention and concentration  language fluent, comprehension intact, naming intact,   fund of knowledge appropriate  CRANIAL NERVE:   2nd - no papilledema on fundoscopic exam  2nd, 3rd, 4th, 6th - pupils equal and reactive to light, visual fields full to confrontation, extraocular muscles intact, no nystagmus  5th - facial sensation symmetric  7th - facial strength symmetric  8th - hearing intact  9th - palate elevates symmetrically, uvula midline  11th - shoulder shrug symmetric  12th - tongue protrusion midline  MOTOR:   ATROPHY IN THIGHS AND CALVES  NO FASCICULATIONS  Full strength in the BUE  BLE --> FULL STRENGTH EXCEPT BILATERAL FOOT DORSIFLEXION 3   SENSORY:   normal and symmetric to light touch, pinprick, temperature, vibration; EXCEPT DECR VIB IN FEET  COORDINATION:   finger-nose-finger, fine finger movements --> NORMAL  REFLEXES:   deep tendon reflexes --> BUE 2, KNEES 1, ANKLES 0  GAIT/STATION:   CAUTIOUS STEPPAGE GAIT; MILD BILATERAL FOOT DROP     DIAGNOSTIC DATA (LABS, IMAGING, TESTING) - I reviewed patient records, labs, notes, testing and imaging myself where available.  Lab Results  Component Value Date   WBC 10.2 06/25/2017   HGB 14.8 06/25/2017   HCT 44.1 06/25/2017   MCV 85.8 06/25/2017   PLT 320 06/25/2017      Component Value Date/Time   NA 138 06/25/2017 1159   K 4.1 06/25/2017 1159   CL 103 06/25/2017 1159   CO2 26 06/25/2017 1159   GLUCOSE 263 (H) 06/25/2017 1159   BUN 12 06/25/2017 1159   CREATININE 0.79 06/25/2017 1159   CALCIUM 9.6 06/25/2017 1159   PROT 7.9 10/11/2017 1647   ALBUMIN 4.3 03/17/2017 0454   AST 21 03/17/2017 0454   ALT 24 03/17/2017 0454   ALKPHOS 95 03/17/2017 0454   BILITOT 0.8 03/17/2017 0454   GFRNONAA >60 06/25/2017 1159   GFRAA >60 06/25/2017 1159   No results found for: CHOL, HDL, LDLCALC, LDLDIRECT, TRIG, CHOLHDL  Hemoglobin A1C  Date Value Ref Range Status  03/30/2018 5.3 4.0 - 5.6 % Final  10/11/2017 6.7 (A) 4.0 - 5.6 % Final   Hgb A1c MFr Bld  Date Value Ref Range Status  03/19/2017 7.6 (H) 4.8 - 5.6 % Final    Comment:    (NOTE) Pre diabetes:          5.7%-6.4% Diabetes:              >6.4% Glycemic control for   <7.0% adults with diabetes    Lab Results  Component Value Date   VITAMINB12 >2000 (H) 10/11/2017   No results found for: TSH   03/15/17 MRI lumbar spine (with and without) [I reviewed images myself and agree with interpretation. -VRP]  - Enhancing intradural midline 5 mm spherical lesion, adjacent to the filum terminale and multiple cauda equina nerve roots. 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 Short-term follow-up 3-6 months recommended with repeat lumbar MRI without and with contrast. In addition, elective, not urgent, neurosurgical consultation  may be warranted. - L5-S1 central protrusion.  No impingement.  03/16/17 MRI brain (with and without) [I  reviewed images myself and agree with interpretation. -VRP]  - Normal MRI of the brain. No intracranial mass lesion or evidence for metastatic disease.  03/16/17 MRI cervical / thoracic spine (without) [I reviewed images myself and agree with interpretation. -VRP]  1. Intermittent cervical spine disc and endplate degeneration with no spinal stenosis. Normal cervical spinal cord. 2. Thoracic disc degeneration at T8-T9 and T9-T10. Small thoracic disc herniations at both levels not resulting in thoracic spinal stenosis. Normal thoracic spinal cord otherwise. 3. Intermittent mild cervical and thoracic neural foraminal stenosis, aside from moderate left thoracic neural foraminal stenosis at the T2 nerve level. 4. Note that this study was done without IV contrast, as Brain MRI without and with contrast is still pending at this time (scheduled for later today), and decision was made to follow-up with postcontrast cervical and/or spine imaging as necessary. But no cervical or thoracic spine lesion is identified for which IV contrast would be valuable.  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  The bilateral peroneal nerves are significantly affected.  11/14/17 MRI cervical spine 1.    The spinal cord appears normal. 2.    There is mild spinal stenosis at C3-C4 due to mild degenerative changes and congenitally short pedicles.    There is no nerve root compression. 3.    The central canal is mildly narrowed at C2-C3 and C4-C5 but not enough to be considered spinal stenosis, mostly due to congenitally short pedicles.     4.     Mild multilevel degenerative changes that do not lead to nerve root compression. 5.     Compared to the MRI dated 03/16/2017, there is no interval change. 6.      There is a normal enhancement pattern and no acute findings.  11/15/18 MRI lumbar spine  1.    As noted previously, there is a 5 mm enhancing intradural mass adjacent to the filum terminale at the L2-L3 level.  It appears unchanged compared to the previous MRI.     Additionally, there appears to be a small 3 mm focus posteriorly to the right adjacent to the conus medullaris at the T12-L1 level.   In retrospect, that second focus was present on the previous MRI and it appears unchanged today.  The foci could represent schwannomas.   Less likely, they could represent ependymomas or meningiomas.  As the foci have not changed over the past 8 months, a metastatic etiology is unlikely. 2.     There are mild lower lumbar degenerative changes that do not lead to any nerve root compression.     ASSESSMENT AND PLAN  53 y.o. year old male here with new onset pain, weakness, numbness in lower extremities starting in December 2018, with fairly rapid worsening symptoms over 2-4 weeks.  This could represent autoimmune or inflammatory peripheral neuropathy.  Also could be related to diabetic plexopathy.  Diabetic and alcoholic neuropathy are also possible.   Ddx: neuropathy (diabetic vs alcoholic) + bilateral lumbosacral plexopathy (diabetic) + chronic right knee pain (post-traumatic)  1. Diabetic polyneuropathy associated with type 2 diabetes mellitus ([place])   2. Alcoholic peripheral neuropathy ([place])   3. Chronic pain of right knee  PLAN:  RIGHT KNEE PAIN (post-traumatic) - follow up with PCP, ortho or sports medicine  NEUROPATHY PAIN (improved) - continue gabapentin - continue cymbalta - continue diabetes treatment - continue etoh cessation  LUMBAR SPINE LESIONS (schwannoma, ependymoma vs meningioma) - likely incidental finding; monitor  WEIGHT LOSS (improved) - follow up with PCP  SNORING / APNEA - consider sleep study; he wants to wait and discuss with PCP  Return for return to  PCP, pending if symptoms worsen or fail to improve.    Penni Bombard, MD 02/21/1094, 0:45 PM Certified in Neurology, Neurophysiology and Neuroimaging  [facility] [address] [place], [place] [address] [phone]

## 2018-08-24 NOTE — Telephone Encounter (Signed)
FWD to PCP. Kyle Novak S Kyle Novak, CMA  

## 2018-09-06 ENCOUNTER — Telehealth (INDEPENDENT_AMBULATORY_CARE_PROVIDER_SITE_OTHER): Payer: Self-pay | Admitting: Primary Care

## 2018-09-06 ENCOUNTER — Other Ambulatory Visit (INDEPENDENT_AMBULATORY_CARE_PROVIDER_SITE_OTHER): Payer: Self-pay | Admitting: Primary Care

## 2018-09-06 DIAGNOSIS — G8929 Other chronic pain: Secondary | ICD-10-CM

## 2018-09-06 NOTE — Telephone Encounter (Signed)
Patient aware that request for tramadol denied. Advised patient to contact pain management for refills. Nat Christen, CMA

## 2018-09-06 NOTE — Telephone Encounter (Signed)
Refill denied will refer to pain management

## 2018-09-06 NOTE — Telephone Encounter (Signed)
Pt would like a refill at least until his follow up appt on 09/12/2018 -traMADol (ULTRAM) 50 MG tablet  to -[facility] [place] Please follow up

## 2018-09-09 ENCOUNTER — Other Ambulatory Visit (INDEPENDENT_AMBULATORY_CARE_PROVIDER_SITE_OTHER): Payer: Self-pay | Admitting: Primary Care

## 2018-09-09 DIAGNOSIS — I10 Essential (primary) hypertension: Secondary | ICD-10-CM

## 2018-09-12 ENCOUNTER — Ambulatory Visit (INDEPENDENT_AMBULATORY_CARE_PROVIDER_SITE_OTHER): Payer: Medicaid Other | Admitting: Primary Care

## 2018-09-20 ENCOUNTER — Ambulatory Visit (INDEPENDENT_AMBULATORY_CARE_PROVIDER_SITE_OTHER): Payer: Medicaid Other | Admitting: Primary Care

## 2018-09-20 ENCOUNTER — Encounter (INDEPENDENT_AMBULATORY_CARE_PROVIDER_SITE_OTHER): Payer: Self-pay | Admitting: Primary Care

## 2018-09-20 ENCOUNTER — Other Ambulatory Visit: Payer: Self-pay

## 2018-09-20 VITALS — BP 127/86 | HR 90 | Temp 98.2°F | Ht 68.5 in | Wt 181.8 lb

## 2018-09-20 DIAGNOSIS — F17210 Nicotine dependence, cigarettes, uncomplicated: Secondary | ICD-10-CM

## 2018-09-20 DIAGNOSIS — C61 Malignant neoplasm of prostate: Secondary | ICD-10-CM | POA: Diagnosis not present

## 2018-09-20 DIAGNOSIS — G4700 Insomnia, unspecified: Secondary | ICD-10-CM

## 2018-09-20 DIAGNOSIS — M25561 Pain in right knee: Secondary | ICD-10-CM | POA: Diagnosis not present

## 2018-09-20 DIAGNOSIS — I10 Essential (primary) hypertension: Secondary | ICD-10-CM | POA: Diagnosis not present

## 2018-09-20 DIAGNOSIS — G8929 Other chronic pain: Secondary | ICD-10-CM

## 2018-09-20 DIAGNOSIS — K219 Gastro-esophageal reflux disease without esophagitis: Secondary | ICD-10-CM

## 2018-09-20 DIAGNOSIS — E1142 Type 2 diabetes mellitus with diabetic polyneuropathy: Secondary | ICD-10-CM | POA: Diagnosis not present

## 2018-09-20 DIAGNOSIS — E119 Type 2 diabetes mellitus without complications: Secondary | ICD-10-CM

## 2018-09-20 DIAGNOSIS — F17209 Nicotine dependence, unspecified, with unspecified nicotine-induced disorders: Secondary | ICD-10-CM

## 2018-09-20 LAB — POCT GLYCOSYLATED HEMOGLOBIN (HGB A1C): Hemoglobin A1C: 7.1 % — AB (ref 4.0–5.6)

## 2018-09-20 MED ORDER — DULOXETINE HCL 60 MG PO CPEP: 60.0000 mg | ORAL_CAPSULE | Freq: Every day | ORAL | 5 refills | Status: DC

## 2018-09-20 MED ORDER — GABAPENTIN 600 MG PO TABS: 1200.0000 mg | ORAL_TABLET | Freq: Three times a day (TID) | ORAL | 5 refills | Status: DC

## 2018-09-20 MED ORDER — HYDROXYZINE PAMOATE 25 MG PO CAPS: 25.0000 mg | ORAL_CAPSULE | Freq: Three times a day (TID) | ORAL | 0 refills | Status: DC | PRN

## 2018-09-20 MED ORDER — LOSARTAN POTASSIUM 50 MG PO TABS: 50.0000 mg | ORAL_TABLET | Freq: Every day | ORAL | 5 refills | Status: DC

## 2018-09-20 MED ORDER — AMLODIPINE BESYLATE 10 MG PO TABS: 10.0000 mg | ORAL_TABLET | Freq: Every day | ORAL | 5 refills | Status: DC

## 2018-09-20 MED ORDER — GLIPIZIDE 10 MG PO TABS: 10.0000 mg | ORAL_TABLET | Freq: Two times a day (BID) | ORAL | 3 refills | Status: DC

## 2018-09-20 MED ORDER — METFORMIN HCL 1000 MG PO TABS: 1000.0000 mg | ORAL_TABLET | Freq: Two times a day (BID) | ORAL | 3 refills | Status: DC

## 2018-09-20 MED ORDER — OMEPRAZOLE 20 MG PO CPDR: 20.0000 mg | DELAYED_RELEASE_CAPSULE | Freq: Every day | ORAL | 3 refills | Status: DC

## 2018-09-20 NOTE — Patient Instructions (Signed)

## 2018-09-20 NOTE — Progress Notes (Signed)
Subjective:  Patient ID: Kyle Novak, male    DOB: 1965/05/03  Age: 53 y.o. MRN: [id number]  CC: No chief complaint on file.   HPI Kyle Novak presents for management for diabetes, burning sensation in upper gastric area especially after eating ,and hypertension - he denies shortness of breath, headaches, chest pain or lower extremity edema.      Past Medical History:  Diagnosis Date  . Diabetes mellitus without complication ([place])   . Hypertension     Past Surgical History:  Procedure Laterality Date  . NO PAST SURGERIES    . WISDOM TOOTH EXTRACTION      Family History  Problem Relation Age of Onset  . Lung cancer Father   . Heart failure Brother     Social History   Socioeconomic History  . Marital status: Single    Spouse name: Not on file  . Number of children: Not on file  . Years of education: Not on file  . Highest education level: Not on file  Occupational History  . Not on file  Social Needs  . Financial resource strain: Not on file  . Food insecurity    Worry: Not on file    Inability: Not on file  . Transportation needs    Medical: Not on file    Non-medical: Not on file  Tobacco Use  . Smoking status: Current Every Day Smoker    Packs/day: 0.50    Types: Cigarettes  . Smokeless tobacco: Never Used  Substance and Sexual Activity  . Alcohol use: Yes    Comment: OCCASIONAL  . Drug use: No  . Sexual activity: Not on file  Lifestyle  . Physical activity    Days per week: Not on file    Minutes per session: Not on file  . Stress: Not on file  Relationships  . Social Herbalist on phone: Not on file    Gets together: Not on file    Attends religious service: Not on file    Active member of club or organization: Not on file    Attends meetings of clubs or organizations: Not on file    Relationship status: Not on file  . Intimate partner violence    Fear of current or ex partner: Not on file    Emotionally  abused: Not on file    Physically abused: Not on file    Forced sexual activity: Not on file  Other Topics Concern  . Not on file  Social History Narrative  . Not on file    Outpatient Medications Prior to Visit  Medication Sig Dispense Refill  . diclofenac sodium (VOLTAREN) 1 % GEL Apply 4 g topically 4 (four) times daily. For knee pain and swelling 2 Tube 6  . LANTUS SOLOSTAR 100 UNIT/ML Solostar Pen INJECT 24 UNITS INTO THE SKIN DAILY 15 mL 1  . Multiple Vitamin (MULITIVITAMIN WITH MINERALS) TABS Take 1 tablet by mouth daily.    Marland Kitchen amLODipine (NORVASC) 10 MG tablet Take 1 tablet (10 mg total) by mouth daily. 30 tablet 5  . DULoxetine (CYMBALTA) 60 MG capsule Take 1 capsule (60 mg total) by mouth daily. 30 capsule 5  . gabapentin (NEURONTIN) 600 MG tablet Take 2 tablets (1,200 mg total) by mouth 3 (three) times daily. 180 tablet 5  . losartan (COZAAR) 50 MG tablet Take 1 tablet (50 mg total) by mouth daily. 30 tablet 5  . traMADol (ULTRAM)  50 MG tablet TAKE 1 (ONE) TABLET BY MOUTH ONCE OR TWICE A DAY AS NEEDED FOR PAIN     Facility-Administered Medications Prior to Visit  Medication Dose Route Frequency Provider Last Rate Last Dose  . acetaminophen (TYLENOL) tablet 650 mg  650 mg Oral Q4H PRN Lorriane Shire, MD        No Known Allergies  ROS Review of Systems  Musculoskeletal: Positive for gait problem.       Uses cain for gait   Neurological: Positive for weakness.  Psychiatric/Behavioral: Positive for sleep disturbance.  All other systems reviewed and are negative.     Objective:    Physical Exam  Constitutional: He is oriented to person, place, and time. He appears well-developed and well-nourished.  HENT:  Head: Normocephalic.  Eyes: EOM are normal.  Neck: Neck supple.  Cardiovascular: Normal rate and regular rhythm.  Pulmonary/Chest: Effort normal and breath sounds normal.  Abdominal: Soft. Bowel sounds are normal. He exhibits distension.  Musculoskeletal:         General: Edema present.     Comments: Right knee crepitus with extension and swollen   Neurological: He is oriented to person, place, and time.  Skin: Skin is warm.  Psychiatric: He has a normal mood and affect.    BP 127/86 (BP Location: Left Arm, Patient Position: Sitting, Cuff Size: Normal)   Pulse 90   Temp 98.2 F (36.8 C) (Tympanic)   Ht 5' 8.5" (1.74 m)   Wt 181 lb 12.8 oz (82.5 kg)   SpO2 97%   BMI 27.24 kg/m  Wt Readings from Last 3 Encounters:  10/05/18 180 lb (81.6 kg)  09/20/18 181 lb 12.8 oz (82.5 kg)  08/24/18 178 lb 3.2 oz (80.8 kg)     Health Maintenance Due  Topic Date Due  . FOOT EXAM  12/31/1975  . OPHTHALMOLOGY EXAM  12/31/1975  . INFLUENZA VACCINE  09/17/2018    There are no preventive care reminders to display for this patient.  No results found for: TSH Lab Results  Component Value Date   WBC 9.8 09/22/2018   HGB 14.3 09/22/2018   HCT 43.3 09/22/2018   MCV 84 09/22/2018   PLT 343 09/22/2018   Lab Results  Component Value Date   NA 139 09/22/2018   K 3.9 09/22/2018   CO2 22 09/22/2018   GLUCOSE 152 (H) 09/22/2018   BUN 9 09/22/2018   CREATININE 0.79 09/22/2018   BILITOT 0.3 09/22/2018   ALKPHOS 134 (H) 09/22/2018   AST 18 09/22/2018   ALT 30 09/22/2018   PROT 6.9 09/22/2018   ALBUMIN 4.3 09/22/2018   CALCIUM 9.3 09/22/2018   ANIONGAP 9 06/25/2017    Lab Results  Component Value Date   HGBA1C 7.1 (A) 09/20/2018      Assessment & Plan:   Diagnoses and all orders for this visit:  Chronic pain of right knee Work on losing weight to help reduce joint pain. May alternate with heat and ice application for pain relief. May also alternate with acetaminophen and Ibuprofen as prescribed pain relief. Other alternatives include massage, acupuncture and water aerobics.  You must stay active and avoid a sedentary lifestyle. -     gabapentin (NEURONTIN) 600 MG tablet; Take 2 tablets (1,200 mg total) by mouth 3 (three) times daily. -      Ambulatory referral to Sports Medicine  Diabetes mellitus without complication ([place]) ADA recommends the following therapeutic goals for glycemic control related to A1c measurements: Goal of therapy:  Less than 6.5 hemoglobin A1c.  Reference clinical practice recommendations. Foods that are high in carbohydrates are the following rice, potatoes, breads, sugars, and pastas.  Reduction in the intake (eating) will assist in lowering your blood sugars. -     HgB A1c -     CBC with Differential/Platelet; Future -     CMP14+EGFR; Future -     Lipid Panel; Future  Tobacco use disorder, continuous Nicotine can decrease circulation in your body and affect every organ. Reseach shows increase in lung cancer and respiratory problems. When you are ready to stop let's talk.  Hypertension, unspecified type Counseled on blood pressure goal of less than 130/80, low-sodium, DASH diet, medication compliance, 150 minutes of moderate intensity exercise per week. Discussed medication compliance, adverse effects. -     CMP14+EGFR; Future -     Lipid Panel; Future -     losartan (COZAAR) 50 MG tablet; Take 1 tablet (50 mg total) by mouth daily. -     amLODipine (NORVASC) 10 MG tablet; Take 1 tablet (10 mg total) by mouth daily.  Malignant neoplasm of prostate ([place]) Normal colon cancer screening.  CDC recommends colorectal screening from ages 57-75. Screening can begin at 73 or earlier in some cases. This screening is used for a disease when no symptoms are present . Diagnostic test is used for symptoms examples blood in stool, colorectal polyps or coloector cancer, family history or inflammatory bowel disease - chron's or ulcerative colitis .(USPSTF) -     PSA; Future  Diabetic polyneuropathy associated with type 2 diabetes mellitus ([place]) ADA recommends the following therapeutic goals for glycemic control related to A1c measurements: Goal of therapy: Less than 6.5 hemoglobin A1c.  Reference clinical practice  recommendations. Foods that are high in carbohydrates are the following rice, potatoes, breads, sugars, and pastas.  Reduction in the intake (eating) will assist in lowering your blood sugars. -     DULoxetine (CYMBALTA) 60 MG capsule; Take 1 capsule (60 mg total) by mouth daily.  Gastroesophageal reflux disease without esophagitis Discussed eating small frequent meal, reduction in acidic foods, fried foods spicy foods, alcohol caffeine and tobacco and certain medications. Avoid laying down after eating 65mns-1hour, elevated head of the bed.  Insomnia, unspecified type   Can try melatonin 536m15 mg at night for sleep, can also do benadryl [id number] night for sleep.  If this does not help we can try prescription medication.  Also here is some information about good sleep hygiene.   Insomnia Insomnia is frequent trouble falling and/or staying asleep. Insomnia can be a long term problem or a short term problem. Both are common. Insomnia can be a short term problem when the wakefulness is related to a certain stress or worry. Long term insomnia is often related to ongoing stress during waking hours and/or poor sleeping habits. Overtime, sleep deprivation itself can make the problem worse. Every little thing feels more severe because you are overtired and your ability to cope is decreased. CAUSES   Stress, anxiety, and depression.  Poor sleeping habits.  Distractions such as TV in the bedroom.  Naps close to bedtime.  Engaging in emotionally charged conversations before bed.  Technical reading before sleep.  Alcohol and other sedatives. They may make the problem worse. They can hurt normal sleep patterns and normal dream activity.  Stimulants such as caffeine for several hours prior to bedtime.  Pain syndromes and shortness of breath can cause insomnia.  Exercise late at night.  Changing time  zones may cause sleeping problems (jet lag). It is sometimes helpful to have someone  observe your sleeping patterns. They should look for periods of not breathing during the night (sleep apnea). They should also look to see how long those periods last. If you live alone or observers are uncertain, you can also be observed at a sleep clinic where your sleep patterns will be professionally monitored. Sleep apnea requires a checkup and treatment. Give your caregivers your medical history. Give your caregivers observations your family has made about your sleep.  SYMPTOMS   Not feeling rested in the morning.  Anxiety and restlessness at bedtime.  Difficulty falling and staying asleep. TREATMENT   Your caregiver may prescribe treatment for an underlying medical disorders. Your caregiver can give advice or help if you are using alcohol or other drugs for self-medication. Treatment of underlying problems will usually eliminate insomnia problems.  Medications can be prescribed for short time use. They are generally not recommended for lengthy use.  Over-the-counter sleep medicines are not recommended for lengthy use. They can be habit forming.  You can promote easier sleeping by making lifestyle changes such as:  Using relaxation techniques that help with breathing and reduce muscle tension.  Exercising earlier in the day.  Changing your diet and the time of your last meal. No night time snacks.  Establish a regular time to go to bed.  Counseling can help with stressful problems and worry.  Soothing music and white noise may be helpful if there are background noises you cannot remove.  Stop tedious detailed work at least one hour before bedtime. HOME CARE INSTRUCTIONS   Keep a diary. Inform your caregiver about your progress. This includes any medication side effects. See your caregiver regularly. Take note of:  Times when you are asleep.  Times when you are awake during the night.  The quality of your sleep.  How you feel the next day. This information will help  your caregiver care for you.  Get out of bed if you are still awake after 15 minutes. Read or do some quiet activity. Keep the lights down. Wait until you feel sleepy and go back to bed.  Keep regular sleeping and waking hours. Avoid naps.  Exercise regularly.  Avoid distractions at bedtime. Distractions include watching television or engaging in any intense or detailed activity like attempting to balance the household checkbook.  Develop a bedtime ritual. Keep a familiar routine of bathing, brushing your teeth, climbing into bed at the same time each night, listening to soothing music. Routines increase the success of falling to sleep faster.  Use relaxation techniques. This can be using breathing and muscle tension release routines. It can also include visualizing peaceful scenes. You can also help control troubling or intruding thoughts by keeping your mind occupied with boring or repetitive thoughts like the old concept of counting sheep. You can make it more creative like imagining planting one beautiful flower after another in your backyard garden.  During your day, work to eliminate stress. When this is not possible use some of the previous suggestions to help reduce the anxiety that accompanies stressful situations. MAKE SURE YOU:   Understand these instructions.  Will watch your condition.  Will get help right away if you are not doing well or get worse. Other orders -     metFORMIN (GLUCOPHAGE) 1000 MG tablet; Take 1 tablet (1,000 mg total) by mouth 2 (two) times daily with a meal. -     glipiZIDE (  GLUCOTROL) 10 MG tablet; Take 1 tablet (10 mg total) by mouth 2 (two) times daily before a meal. -     omeprazole (PRILOSEC) 20 MG capsule; Take 1 capsule (20 mg total) by mouth daily. -     hydrOXYzine (VISTARIL) 25 MG capsule; Take 1 capsule (25 mg total) by mouth 3 (three) times daily as needed.    Meds ordered this encounter  Medications  . DULoxetine (CYMBALTA) 60 MG capsule     Sig: Take 1 capsule (60 mg total) by mouth daily.    Dispense:  30 capsule    Refill:  5  . losartan (COZAAR) 50 MG tablet    Sig: Take 1 tablet (50 mg total) by mouth daily.    Dispense:  30 tablet    Refill:  5  . amLODipine (NORVASC) 10 MG tablet    Sig: Take 1 tablet (10 mg total) by mouth daily.    Dispense:  30 tablet    Refill:  5  . gabapentin (NEURONTIN) 600 MG tablet    Sig: Take 2 tablets (1,200 mg total) by mouth 3 (three) times daily.    Dispense:  180 tablet    Refill:  5  . metFORMIN (GLUCOPHAGE) 1000 MG tablet    Sig: Take 1 tablet (1,000 mg total) by mouth 2 (two) times daily with a meal.    Dispense:  180 tablet    Refill:  3  . glipiZIDE (GLUCOTROL) 10 MG tablet    Sig: Take 1 tablet (10 mg total) by mouth 2 (two) times daily before a meal.    Dispense:  60 tablet    Refill:  3  . omeprazole (PRILOSEC) 20 MG capsule    Sig: Take 1 capsule (20 mg total) by mouth daily.    Dispense:  30 capsule    Refill:  3  . hydrOXYzine (VISTARIL) 25 MG capsule    Sig: Take 1 capsule (25 mg total) by mouth 3 (three) times daily as needed.    Dispense:  30 capsule    Refill:  0    Follow-up: Return in 3 months (on 12/21/2018) for DM  schedule lab appointment for fasting labs .    Kerin Perna, NP

## 2018-09-20 NOTE — Progress Notes (Signed)
Pt complains of intermittent pain when eating or drinking. Feels like he may have an ulcer again

## 2018-09-21 ENCOUNTER — Telehealth (INDEPENDENT_AMBULATORY_CARE_PROVIDER_SITE_OTHER): Payer: Self-pay

## 2018-09-21 NOTE — Telephone Encounter (Signed)
Patient had an appointment yesterday and was advice by PCP that she would send an RX to help him sleep. Patient would like to know which medication is for sleep and if she did not prescribe anything to help with his sleep could she send an RX to his pharmacy. Patient also states that pharmacy advice him that his PCP wants him to take 3 pills of DULoxetine (CYMBALTA) 60 MG capsule  Patient would like to know if he should take 3 capsules or continue taking 1.  Patient uses [facility], [place]  Please advise [phone]  Thank you Whitney Post

## 2018-09-21 NOTE — Telephone Encounter (Signed)
Please clarify and send Rx. Nat Christen, CMA

## 2018-09-21 NOTE — Telephone Encounter (Signed)
Spoke with patient given clarity on instructions to Cymbalta daily and vistaril at bedtime for sleep may be use for anxiety if needed. Patient voiced understanding

## 2018-09-22 ENCOUNTER — Other Ambulatory Visit: Payer: Self-pay

## 2018-09-22 ENCOUNTER — Other Ambulatory Visit (INDEPENDENT_AMBULATORY_CARE_PROVIDER_SITE_OTHER): Payer: Medicaid Other

## 2018-09-23 ENCOUNTER — Telehealth (INDEPENDENT_AMBULATORY_CARE_PROVIDER_SITE_OTHER): Payer: Self-pay

## 2018-09-23 LAB — LIPID PANEL
Chol/HDL Ratio: 3.3 ratio (ref 0.0–5.0)
Cholesterol, Total: 142 mg/dL (ref 100–199)
HDL: 43 mg/dL (ref >39)
LDL Calculated: 82 mg/dL (ref 0–99)
Triglycerides: 86 mg/dL (ref 0–149)
VLDL Cholesterol Cal: 17 mg/dL (ref 5–40)

## 2018-09-23 LAB — CMP14+EGFR
ALT: 30 IU/L (ref 0–44)
AST: 18 IU/L (ref 0–40)
Albumin/Globulin Ratio: 1.7 (ref 1.2–2.2)
Albumin: 4.3 g/dL (ref 3.8–4.9)
Alkaline Phosphatase: 134 IU/L — ABNORMAL HIGH (ref 39–117)
BUN/Creatinine Ratio: 11 (ref 9–20)
BUN: 9 mg/dL (ref 6–24)
Bilirubin Total: 0.3 mg/dL (ref 0.0–1.2)
CO2: 22 mmol/L (ref 20–29)
Calcium: 9.3 mg/dL (ref 8.7–10.2)
Chloride: 103 mmol/L (ref 96–106)
Creatinine, Ser: 0.79 mg/dL (ref 0.76–1.27)
GFR calc Af Amer: 119 mL/min/{1.73_m2} (ref >59)
GFR calc non Af Amer: 103 mL/min/{1.73_m2} (ref >59)
Globulin, Total: 2.6 g/dL (ref 1.5–4.5)
Glucose: 152 mg/dL — ABNORMAL HIGH (ref 65–99)
Potassium: 3.9 mmol/L (ref 3.5–5.2)
Sodium: 139 mmol/L (ref 134–144)
Total Protein: 6.9 g/dL (ref 6.0–8.5)

## 2018-09-23 LAB — CBC WITH DIFFERENTIAL/PLATELET
Basophils Absolute: 0.1 10*3/uL (ref 0.0–0.2)
Basos: 1 %
EOS (ABSOLUTE): 0.1 10*3/uL (ref 0.0–0.4)
Eos: 1 %
Hematocrit: 43.3 % (ref 37.5–51.0)
Hemoglobin: 14.3 g/dL (ref 13.0–17.7)
Immature Grans (Abs): 0.1 10*3/uL (ref 0.0–0.1)
Immature Granulocytes: 1 %
Lymphocytes Absolute: 2.8 10*3/uL (ref 0.7–3.1)
Lymphs: 29 %
MCH: 27.9 pg (ref 26.6–33.0)
MCHC: 33 g/dL (ref 31.5–35.7)
MCV: 84 fL (ref 79–97)
Monocytes Absolute: 0.9 10*3/uL (ref 0.1–0.9)
Monocytes: 9 %
Neutrophils Absolute: 5.9 10*3/uL (ref 1.4–7.0)
Neutrophils: 59 %
Platelets: 343 10*3/uL (ref 150–450)
RBC: 5.13 x10E6/uL (ref 4.14–5.80)
RDW: 11.5 % — ABNORMAL LOW (ref 11.6–15.4)
WBC: 9.8 10*3/uL (ref 3.4–10.8)

## 2018-09-23 LAB — PSA: Prostate Specific Ag, Serum: 1.5 ng/mL (ref 0.0–4.0)

## 2018-09-23 NOTE — Telephone Encounter (Signed)
Patient is aware that labs are normal. Tempestt S Roberts, CMA  

## 2018-09-23 NOTE — Telephone Encounter (Signed)
-----   Message from Kerin Perna, NP sent at 09/23/2018 10:38 AM EDT ----- I have reviewed all labs and they are normal/unremarkable. Please notify patient of the results. Have them schedule appointment if there are any other concerns or questions. Thank you. PSA normal

## 2018-10-05 ENCOUNTER — Other Ambulatory Visit: Payer: Self-pay

## 2018-10-05 ENCOUNTER — Encounter: Payer: Self-pay | Admitting: Family Medicine

## 2018-10-05 ENCOUNTER — Ambulatory Visit (INDEPENDENT_AMBULATORY_CARE_PROVIDER_SITE_OTHER): Payer: Medicaid Other | Admitting: Family Medicine

## 2018-10-05 VITALS — BP 124/80 | Ht 68.0 in | Wt 180.0 lb

## 2018-10-05 DIAGNOSIS — G8929 Other chronic pain: Secondary | ICD-10-CM

## 2018-10-05 DIAGNOSIS — M25561 Pain in right knee: Secondary | ICD-10-CM

## 2018-10-05 NOTE — Progress Notes (Signed)
PCP: Kerin Perna, NP  Subjective:   HPI: Patient is a 53 y.o. male here for evaluation of right knee pain.  Patient's pain has been present for greater than 1 year.  Patient notes about a year and a half ago he fell on the anterior aspect of his right knee has had pain since then.  The pain is diffuse throughout his knee.  Does not radiate.  He endorses a history of neuropathy that affects his right leg which also complicates his pain.  He has seen multiple physicians for his knee pain and has not had any improvement through various treatment means.  He had an MRI in September which was normal.  Patient has had multiple corticosteroid injections of his knee without benefit.  Patient notes he has also had his knee aspirated which did not help improve his pain.  Patient has gone through physical therapy as well without benefit.  He will get intermittent swelling of his knee that goes down into his feet.  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  No skin changes.  Review of Systems: See HPI above.  Past Medical History:  Diagnosis Date  . Diabetes mellitus without complication ([place])   . Hypertension     Past Surgical History:  Procedure Laterality Date  . NO PAST SURGERIES    . WISDOM TOOTH EXTRACTION      No Known Allergies  Social History   Socioeconomic History  . Marital status: Single    Spouse name: Not on file  . Number of children: Not on file  . Years of education: Not on file  . Highest education level: Not on file  Occupational History  . Not on file  Social Needs  . Financial resource strain: Not on file  . Food insecurity    Worry: Not on file    Inability: Not on file  . Transportation needs    Medical: Not on file    Non-medical: Not on file  Tobacco Use  . Smoking status: Current Every Day Smoker    Packs/day: 0.50    Types: Cigarettes  . Smokeless tobacco: Never Used  Substance and  Sexual Activity  . Alcohol use: Yes    Comment: OCCASIONAL  . Drug use: No  . Sexual activity: Not on file  Lifestyle  . Physical activity    Days per week: Not on file    Minutes per session: Not on file  . Stress: Not on file  Relationships  . Social Herbalist on phone: Not on file    Gets together: Not on file    Attends religious service: Not on file    Active member of club or organization: Not on file    Attends meetings of clubs or organizations: Not on file    Relationship status: Not on file  . Intimate partner violence    Fear of current or ex partner: Not on file    Emotionally abused: Not on file    Physically abused: Not on file    Forced sexual activity: Not on file  Other Topics Concern  . Not on file  Social History Narrative  . Not on file    Family History  Problem Relation Age of Onset  . Lung cancer Father   . Heart failure Brother         Objective:  Physical Exam: BP 124/80   Ht 5\' 8"  (1.727 m)  Wt 180 lb (81.6 kg)   BMI 27.37 kg/m  Gen: NAD, comfortable in exam room Lungs: Breathing comfortably on room air  Knee Exam Right -Inspection: no deformity, no discoloration -Palpation: medial joint line: Non-tender; lateral joint line: non-tender; quad tendon: non-tender; patella: non-tender; patellar tendon: non-tender -ROM: Extension: -5 degrees; Flexion: 150 degrees -Strength: Extension: 5/5; Flexion: 5/5 -Special Tests: Varus Stress: Negative; Valgus Stress: Negative; Lachman: Negative; Posterior drawer: Negative; McMurray: Positive; Thessaly: Positive -Limb neurovascularly intact  Contralateral Knee -Inspection: no deformity, no discoloration -Palpation: medial joint line: Non-tender; lateral joint line: non-tender; quad tendon: non-tender; patella: non-tender; patellar tendon: non-tendon -ROM: Extension: -10 degrees; Flexion: 150 degrees -Strength: Extension: 5/5; Flexion: 5/5 -Limb neurovascularly intact    Assessment &  Plan:  Patient is a 53 y.o. male here for evaluation of right knee pain.  1.  Chronic right knee pain - Given patient's exam findings there is a concern for possible meniscus tear that was missed on previous MRI - Repeat MRI has been ordered -Patient was advised to avoid deep squats -Patient may continue oral anti-inflammatories as needed for pain  Patient will follow-up after the MRI

## 2018-10-05 NOTE — Patient Instructions (Signed)
As you knee pain is been present for greater than 1 year we will repeat an MRI You should avoid deep squats in the meantime You may take anti-inflammatories such as ibuprofen or naproxen as needed for your pain We will have you follow-up after the MRI to discuss the results

## 2018-10-07 ENCOUNTER — Encounter: Payer: Self-pay | Admitting: Family Medicine

## 2018-11-02 ENCOUNTER — Other Ambulatory Visit: Payer: Self-pay

## 2018-11-02 ENCOUNTER — Other Ambulatory Visit: Payer: Medicaid Other

## 2018-11-02 ENCOUNTER — Ambulatory Visit
Admission: RE | Admit: 2018-11-02 | Discharge: 2018-11-02 | Disposition: A | Discharge status: Home / Self Care | Payer: Medicaid Other | Source: Ambulatory Visit | Attending: Family Medicine | Admitting: Family Medicine

## 2018-11-02 DIAGNOSIS — G8929 Other chronic pain: Secondary | ICD-10-CM

## 2018-11-09 ENCOUNTER — Other Ambulatory Visit: Payer: Self-pay

## 2018-11-09 ENCOUNTER — Encounter: Payer: Self-pay | Admitting: Family Medicine

## 2018-11-09 ENCOUNTER — Ambulatory Visit (INDEPENDENT_AMBULATORY_CARE_PROVIDER_SITE_OTHER): Payer: Medicaid Other | Admitting: Family Medicine

## 2018-11-09 VITALS — BP 110/84 | Ht 69.0 in | Wt 180.0 lb

## 2018-11-09 DIAGNOSIS — G8929 Other chronic pain: Secondary | ICD-10-CM

## 2018-11-09 DIAGNOSIS — M25561 Pain in right knee: Secondary | ICD-10-CM

## 2018-11-09 NOTE — Progress Notes (Signed)
MRI reviewed and discussed with patient.  He does have a complex medial meniscus tear in setting of underlying neuropathy.  I do think majority of his symptoms correspond to the tear with his catching, buckling, positive meniscal testing.  When he had last injection by Dr. Erlinda Hong there was relief noted with anesthetic.  Given he's not had improvement with extensive conservative measures including PT, steroid injections, sleeve, home exercises, will refer back to discuss arthroscopic debridement.

## 2018-11-15 ENCOUNTER — Ambulatory Visit (INDEPENDENT_AMBULATORY_CARE_PROVIDER_SITE_OTHER): Payer: Medicaid Other | Admitting: Orthopaedic Surgery

## 2018-11-15 ENCOUNTER — Encounter: Payer: Self-pay | Admitting: Orthopaedic Surgery

## 2018-11-15 DIAGNOSIS — S83241A Other tear of medial meniscus, current injury, right knee, initial encounter: Secondary | ICD-10-CM | POA: Diagnosis not present

## 2018-11-15 NOTE — Progress Notes (Signed)
Office Visit Note   Patient: Kyle Novak           Date of Birth: 12/14/1965           MRN: [id number] Visit Date: 11/15/2018              Requested by: Kyle Gentry, MD [address] [place],  [place] [address] PCP: Kyle Perna, NP   Assessment & Plan: Visit Diagnoses:  1. Acute medial meniscus tear of right knee, initial encounter     Plan: Impression is symptomatic acute right medial meniscal tear.  Patient has failed conservative treatment.  Continues to have severe pain along the medial joint line especially with activity.  At this point patient would like to proceed with arthroscopic partial medial meniscectomy.  Details of surgery including possible complications and risks and rehab and recovery were reviewed with the patient.  Questions encouraged and answered.  Follow-Up Instructions: Return for 1 week postop visit.   Orders:  No orders of the defined types were placed in this encounter.  No orders of the defined types were placed in this encounter.     Procedures: No procedures performed   Clinical Data: No additional findings.   Subjective: Chief Complaint  Patient presents with  . Right Knee - Pain    Kyle Novak is a 53 year old gentleman comes in for evaluation of chronic right knee pain for over a year.  He has had pain on the medial side of the right knee for about a year.  He states that he has a stabbing pain.  He has tried wearing a knee compression sleeve as well as using tramadol and Voltaren gel which have not helped.  He feels like his knee is constantly buckling.  We have provided him with a cortisone injection which did not help.   Review of Systems  Constitutional: Negative.   All other systems reviewed and are negative.    Objective: Vital Signs: There were no vitals taken for this visit.  Physical Exam Vitals signs and nursing note reviewed.  Constitutional:      Appearance: He is well-developed.  Pulmonary:   Effort: Pulmonary effort is normal.  Abdominal:     Palpations: Abdomen is soft.  Skin:    General: Skin is warm.  Neurological:     Mental Status: He is alert and oriented to person, place, and time.  Psychiatric:        Behavior: Behavior normal.        Thought Content: Thought content normal.        Judgment: Judgment normal.     Ortho Exam Right knee exam shows exquisite medial joint line tenderness.  Painful range of motion.  No joint effusion.  Pain radiates to the posterior medial aspect of the knee. Specialty Comments:  No specialty comments available.  Imaging: No results found.   PMFS History: Patient Active Problem List   Diagnosis Date Noted  . Acute medial meniscus tear of right knee 11/15/2018  . Chronic pain of right knee 03/22/2018  . 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 Spinal cord mass (Kyle Novak) 03/16/2017  . Diabetes mellitus without complication ([place])   . Hypertension    Past Medical History:  Diagnosis Date  . Diabetes mellitus without complication ([place])   . Hypertension     Family History  Problem Relation Age of Onset  . Lung cancer Father   . Heart failure Brother     Past Surgical History:  Procedure  Laterality Date  . NO PAST SURGERIES    . WISDOM TOOTH EXTRACTION     Social History   Occupational History  . Not on file  Tobacco Use  . Smoking status: Current Every Day Smoker    Packs/day: 0.50    Types: Cigarettes  . Smokeless tobacco: Never Used  Substance and Sexual Activity  . Alcohol use: Yes    Comment: OCCASIONAL  . Drug use: No  . Sexual activity: Not on file

## 2018-11-16 ENCOUNTER — Encounter (HOSPITAL_BASED_OUTPATIENT_CLINIC_OR_DEPARTMENT_OTHER): Payer: Self-pay | Admitting: *Deleted

## 2018-11-16 ENCOUNTER — Other Ambulatory Visit: Payer: Self-pay

## 2018-11-17 ENCOUNTER — Other Ambulatory Visit: Payer: Self-pay

## 2018-11-17 ENCOUNTER — Encounter (HOSPITAL_BASED_OUTPATIENT_CLINIC_OR_DEPARTMENT_OTHER)
Admission: RE | Admit: 2018-11-17 | Discharge: 2018-11-17 | Disposition: A | Discharge status: Home / Self Care | Payer: Medicaid Other | Source: Ambulatory Visit | Attending: Orthopaedic Surgery | Admitting: Orthopaedic Surgery

## 2018-11-17 DIAGNOSIS — S83241A Other tear of medial meniscus, current injury, right knee, initial encounter: Secondary | ICD-10-CM | POA: Insufficient documentation

## 2018-11-17 DIAGNOSIS — Z01818 Encounter for other preprocedural examination: Secondary | ICD-10-CM | POA: Insufficient documentation

## 2018-11-17 DIAGNOSIS — R9431 Abnormal electrocardiogram [ECG] [EKG]: Secondary | ICD-10-CM | POA: Insufficient documentation

## 2018-11-17 LAB — BASIC METABOLIC PANEL
Anion gap: 11 (ref 5–15)
BUN: 12 mg/dL (ref 6–20)
CO2: 24 mmol/L (ref 22–32)
Calcium: 9.6 mg/dL (ref 8.9–10.3)
Chloride: 104 mmol/L (ref 98–111)
Creatinine, Ser: 0.82 mg/dL (ref 0.61–1.24)
GFR calc Af Amer: >60 mL/min (ref >60)
GFR calc non Af Amer: >60 mL/min (ref >60)
Glucose, Bld: 183 mg/dL — ABNORMAL HIGH (ref 70–99)
Potassium: 4.8 mmol/L (ref 3.5–5.1)
Sodium: 139 mmol/L (ref 135–145)

## 2018-11-17 NOTE — Progress Notes (Signed)

## 2018-11-19 ENCOUNTER — Other Ambulatory Visit (HOSPITAL_COMMUNITY)
Admission: RE | Admit: 2018-11-19 | Discharge: 2018-11-19 | Disposition: A | Discharge status: Home / Self Care | Payer: Medicaid Other | Source: Ambulatory Visit | Attending: Orthopaedic Surgery | Admitting: Orthopaedic Surgery

## 2018-11-19 ENCOUNTER — Other Ambulatory Visit (HOSPITAL_COMMUNITY): Payer: Medicaid Other

## 2018-11-19 DIAGNOSIS — Z20828 Contact with and (suspected) exposure to other viral communicable diseases: Secondary | ICD-10-CM | POA: Diagnosis not present

## 2018-11-19 DIAGNOSIS — Z01812 Encounter for preprocedural laboratory examination: Secondary | ICD-10-CM | POA: Insufficient documentation

## 2018-11-21 LAB — NOVEL CORONAVIRUS, NAA (HOSP ORDER, SEND-OUT TO REF LAB; TAT 18-24 HRS): SARS-CoV-2, NAA: NOT DETECTED

## 2018-11-23 ENCOUNTER — Encounter: Payer: Self-pay | Admitting: Orthopaedic Surgery

## 2018-11-23 ENCOUNTER — Other Ambulatory Visit: Payer: Self-pay

## 2018-11-23 ENCOUNTER — Encounter (HOSPITAL_BASED_OUTPATIENT_CLINIC_OR_DEPARTMENT_OTHER)
Admission: RE | Disposition: A | Discharge status: Home / Self Care | Payer: Self-pay | Source: Home / Self Care | Attending: Orthopaedic Surgery

## 2018-11-23 ENCOUNTER — Ambulatory Visit (HOSPITAL_BASED_OUTPATIENT_CLINIC_OR_DEPARTMENT_OTHER): Payer: Medicaid Other | Admitting: Anesthesiology

## 2018-11-23 ENCOUNTER — Encounter (HOSPITAL_BASED_OUTPATIENT_CLINIC_OR_DEPARTMENT_OTHER): Payer: Self-pay

## 2018-11-23 ENCOUNTER — Ambulatory Visit (HOSPITAL_BASED_OUTPATIENT_CLINIC_OR_DEPARTMENT_OTHER)
Admission: RE | Admit: 2018-11-23 | Discharge: 2018-11-23 | Disposition: A | Discharge status: Home / Self Care | Payer: Medicaid Other | Attending: Orthopaedic Surgery | Admitting: Orthopaedic Surgery

## 2018-11-23 DIAGNOSIS — K219 Gastro-esophageal reflux disease without esophagitis: Secondary | ICD-10-CM | POA: Diagnosis not present

## 2018-11-23 DIAGNOSIS — M659 Synovitis and tenosynovitis, unspecified: Secondary | ICD-10-CM | POA: Insufficient documentation

## 2018-11-23 DIAGNOSIS — I10 Essential (primary) hypertension: Secondary | ICD-10-CM | POA: Insufficient documentation

## 2018-11-23 DIAGNOSIS — E119 Type 2 diabetes mellitus without complications: Secondary | ICD-10-CM | POA: Diagnosis not present

## 2018-11-23 DIAGNOSIS — Z79899 Other long term (current) drug therapy: Secondary | ICD-10-CM | POA: Diagnosis not present

## 2018-11-23 DIAGNOSIS — F17210 Nicotine dependence, cigarettes, uncomplicated: Secondary | ICD-10-CM | POA: Diagnosis not present

## 2018-11-23 DIAGNOSIS — Z791 Long term (current) use of non-steroidal anti-inflammatories (NSAID): Secondary | ICD-10-CM | POA: Diagnosis not present

## 2018-11-23 DIAGNOSIS — S83241A Other tear of medial meniscus, current injury, right knee, initial encounter: Secondary | ICD-10-CM | POA: Insufficient documentation

## 2018-11-23 DIAGNOSIS — Z7984 Long term (current) use of oral hypoglycemic drugs: Secondary | ICD-10-CM | POA: Diagnosis not present

## 2018-11-23 DIAGNOSIS — X58XXXA Exposure to other specified factors, initial encounter: Secondary | ICD-10-CM | POA: Diagnosis not present

## 2018-11-23 HISTORY — PX: KNEE ARTHROSCOPY WITH MEDIAL MENISECTOMY: SHX5651

## 2018-11-23 HISTORY — DX: Gastro-esophageal reflux disease without esophagitis: K21.9

## 2018-11-23 LAB — GLUCOSE, CAPILLARY
Glucose-Capillary: 145 mg/dL — ABNORMAL HIGH (ref 70–99)
Glucose-Capillary: 171 mg/dL — ABNORMAL HIGH (ref 70–99)

## 2018-11-23 SURGERY — ARTHROSCOPY, KNEE, WITH MEDIAL MENISCECTOMY
Anesthesia: General | Site: Knee | Laterality: Right

## 2018-11-23 MED ORDER — PROPOFOL 10 MG/ML IV BOLUS
INTRAVENOUS | Status: AC
  Filled 2018-11-23: qty 20

## 2018-11-23 MED ORDER — KETOROLAC TROMETHAMINE 30 MG/ML IJ SOLN: 30.0000 mg | Freq: Once | INTRAMUSCULAR | Status: DC | PRN

## 2018-11-23 MED ORDER — FENTANYL CITRATE (PF) 100 MCG/2ML IJ SOLN
INTRAMUSCULAR | Status: DC | PRN
  Administered 2018-11-23: 25 ug via INTRAVENOUS
  Administered 2018-11-23: 50 ug via INTRAVENOUS
  Administered 2018-11-23 (×2): 25 ug via INTRAVENOUS

## 2018-11-23 MED ORDER — SODIUM CHLORIDE 0.9 % IR SOLN
Status: DC | PRN
  Administered 2018-11-23: 3000 mL

## 2018-11-23 MED ORDER — ONDANSETRON HCL 4 MG/2ML IJ SOLN
INTRAMUSCULAR | Status: AC
  Filled 2018-11-23: qty 2

## 2018-11-23 MED ORDER — HYDROCODONE-ACETAMINOPHEN 5-325 MG PO TABS: 1.0000 | ORAL_TABLET | Freq: Three times a day (TID) | ORAL | 0 refills | Status: DC | PRN

## 2018-11-23 MED ORDER — ACETAMINOPHEN 500 MG PO TABS
ORAL_TABLET | ORAL | Status: AC
  Filled 2018-11-23: qty 2

## 2018-11-23 MED ORDER — PROMETHAZINE HCL 25 MG/ML IJ SOLN: 6.2500 mg | INTRAMUSCULAR | Status: DC | PRN

## 2018-11-23 MED ORDER — ONDANSETRON HCL 4 MG PO TABS: 4.0000 mg | ORAL_TABLET | Freq: Three times a day (TID) | ORAL | 0 refills | Status: DC | PRN

## 2018-11-23 MED ORDER — MIDAZOLAM HCL 2 MG/2ML IJ SOLN
INTRAMUSCULAR | Status: DC | PRN
  Administered 2018-11-23: 2 mg via INTRAVENOUS

## 2018-11-23 MED ORDER — LACTATED RINGERS IV SOLN
INTRAVENOUS | Status: DC
  Administered 2018-11-23: 10:00:00 via INTRAVENOUS

## 2018-11-23 MED ORDER — FENTANYL CITRATE (PF) 100 MCG/2ML IJ SOLN
INTRAMUSCULAR | Status: AC
  Filled 2018-11-23: qty 2

## 2018-11-23 MED ORDER — PROPOFOL 10 MG/ML IV BOLUS
INTRAVENOUS | Status: DC | PRN
  Administered 2018-11-23: 150 mg via INTRAVENOUS

## 2018-11-23 MED ORDER — OXYCODONE HCL 5 MG/5ML PO SOLN: 5.0000 mg | Freq: Once | ORAL | Status: DC | PRN

## 2018-11-23 MED ORDER — CHLORHEXIDINE GLUCONATE 4 % EX LIQD: 60.0000 mL | Freq: Once | CUTANEOUS | Status: DC

## 2018-11-23 MED ORDER — MIDAZOLAM HCL 2 MG/2ML IJ SOLN
INTRAMUSCULAR | Status: AC
  Filled 2018-11-23: qty 2

## 2018-11-23 MED ORDER — ONDANSETRON HCL 4 MG/2ML IJ SOLN
INTRAMUSCULAR | Status: DC | PRN
  Administered 2018-11-23: 4 mg via INTRAVENOUS

## 2018-11-23 MED ORDER — LIDOCAINE 2% (20 MG/ML) 5 ML SYRINGE
INTRAMUSCULAR | Status: DC | PRN
  Administered 2018-11-23: 60 mg via INTRAVENOUS

## 2018-11-23 MED ORDER — ACETAMINOPHEN 500 MG PO TABS
1000.0000 mg | ORAL_TABLET | Freq: Once | ORAL | Status: AC
  Administered 2018-11-23: 09:00:00 1000 mg via ORAL

## 2018-11-23 MED ORDER — CEFAZOLIN SODIUM-DEXTROSE 2-4 GM/100ML-% IV SOLN
2.0000 g | INTRAVENOUS | Status: AC
  Administered 2018-11-23: 2 g via INTRAVENOUS

## 2018-11-23 MED ORDER — LIDOCAINE 2% (20 MG/ML) 5 ML SYRINGE
INTRAMUSCULAR | Status: AC
  Filled 2018-11-23: qty 5

## 2018-11-23 MED ORDER — CEFAZOLIN SODIUM-DEXTROSE 2-4 GM/100ML-% IV SOLN
INTRAVENOUS | Status: AC
  Filled 2018-11-23: qty 100

## 2018-11-23 MED ORDER — MEPERIDINE HCL 25 MG/ML IJ SOLN: 6.2500 mg | INTRAMUSCULAR | Status: DC | PRN

## 2018-11-23 MED ORDER — OXYCODONE HCL 5 MG PO TABS: 5.0000 mg | ORAL_TABLET | Freq: Once | ORAL | Status: DC | PRN

## 2018-11-23 MED ORDER — BUPIVACAINE HCL (PF) 0.25 % IJ SOLN
INTRAMUSCULAR | Status: DC | PRN
  Administered 2018-11-23: 20 mL via INTRA_ARTICULAR

## 2018-11-23 MED ORDER — KETOROLAC TROMETHAMINE 10 MG PO TABS: 10.0000 mg | ORAL_TABLET | Freq: Two times a day (BID) | ORAL | 0 refills | Status: DC | PRN

## 2018-11-23 MED ORDER — HYDROMORPHONE HCL 1 MG/ML IJ SOLN: 0.2500 mg | INTRAMUSCULAR | Status: DC | PRN

## 2018-11-23 SURGICAL SUPPLY — 43 items
BANDAGE ESMARK 6X9 LF (GAUZE/BANDAGES/DRESSINGS) IMPLANT
BLADE CUDA GRT WHITE 3.5 (BLADE) IMPLANT
BLADE CUDA SHAVER 3.5 (BLADE) IMPLANT
BLADE CUTTER GATOR 3.5 (BLADE) IMPLANT
BLADE GREAT WHITE 4.2 (BLADE) IMPLANT
BLADE GREAT WHITE 4.2MM (BLADE)
BLADE LANZA CVD 15 DEG (BLADE) IMPLANT
BNDG CMPR 9X6 STRL LF SNTH (GAUZE/BANDAGES/DRESSINGS)
BNDG ELASTIC 6X5.8 VLCR STR LF (GAUZE/BANDAGES/DRESSINGS) ×6 IMPLANT
BNDG ESMARK 6X9 LF (GAUZE/BANDAGES/DRESSINGS)
COVER WAND RF STERILE (DRAPES) IMPLANT
CUFF TOURN SGL QUICK 34 (TOURNIQUET CUFF) ×3
CUFF TRNQT CYL 34X4.125X (TOURNIQUET CUFF) ×1 IMPLANT
DRAPE ARTHROSCOPY W/POUCH 90 (DRAPES) ×3 IMPLANT
DRAPE IMP U-DRAPE 54X76 (DRAPES) ×5 IMPLANT
DRAPE U-SHAPE 47X51 STRL (DRAPES) ×3 IMPLANT
DURAPREP 26ML APPLICATOR (WOUND CARE) ×3 IMPLANT
GAUZE SPONGE 4X4 12PLY STRL (GAUZE/BANDAGES/DRESSINGS) ×3 IMPLANT
GAUZE XEROFORM 1X8 LF (GAUZE/BANDAGES/DRESSINGS) ×3 IMPLANT
GLOVE BIOGEL PI IND STRL 7.0 (GLOVE) ×1 IMPLANT
GLOVE BIOGEL PI INDICATOR 7.0 (GLOVE) ×2
GLOVE ECLIPSE 7.0 STRL STRAW (GLOVE) ×3 IMPLANT
GLOVE SKINSENSE NS SZ7.5 (GLOVE) ×2
GLOVE SKINSENSE STRL SZ7.5 (GLOVE) ×1 IMPLANT
GLOVE SURG SYN 7.5  E (GLOVE) ×2
GLOVE SURG SYN 7.5 E (GLOVE) ×1 IMPLANT
GLOVE SURG SYN 7.5 PF PI (GLOVE) ×1 IMPLANT
GOWN STRL REIN XL XLG (GOWN DISPOSABLE) ×3 IMPLANT
GOWN STRL REUS W/ TWL LRG LVL3 (GOWN DISPOSABLE) ×1 IMPLANT
GOWN STRL REUS W/ TWL XL LVL3 (GOWN DISPOSABLE) ×1 IMPLANT
GOWN STRL REUS W/TWL LRG LVL3 (GOWN DISPOSABLE)
GOWN STRL REUS W/TWL XL LVL3 (GOWN DISPOSABLE) ×9
KNEE WRAP E Z 3 GEL PACK (MISCELLANEOUS) ×3 IMPLANT
MANIFOLD NEPTUNE II (INSTRUMENTS) ×3 IMPLANT
PACK ARTHROSCOPY DSU (CUSTOM PROCEDURE TRAY) ×3 IMPLANT
PACK BASIN DAY SURGERY FS (CUSTOM PROCEDURE TRAY) ×3 IMPLANT
RESECTOR FULL RADIUS 4.2MM (BLADE) IMPLANT
SHAVER 4.2 MM LANZA 9391A (BLADE) ×1 IMPLANT
SUT ETHILON 3 0 PS 1 (SUTURE) ×3 IMPLANT
TOWEL GREEN STERILE FF (TOWEL DISPOSABLE) ×3 IMPLANT
TUBING ARTHRO INFLOW-ONLY STRL (TUBING) ×1 IMPLANT
TUBING ARTHROSCOPY IRRIG 16FT (MISCELLANEOUS) ×2 IMPLANT
Torpedo shaver ×2 IMPLANT

## 2018-11-23 NOTE — Op Note (Signed)
   Surgery Date: 11/23/2018  Surgeon(s): Leandrew Koyanagi, MD  ASSIST: Madalyn Rob, [place]; necessary for the timely completion of procedure and due to complexity of procedure.  ANESTHESIA:  general  FLUIDS: Per anesthesia record.   ESTIMATED BLOOD LOSS: minimal  PREOPERATIVE DIAGNOSES:  1. Right knee medial meniscus tear 2. Right knee synovitis  POSTOPERATIVE DIAGNOSES:  1. Right knee synovitis  PROCEDURES PERFORMED:  1. Right knee arthroscopy with major synovectomy 2. Right knee arthroscopy with arthroscopic chondroplasty medial femoral condyle  DESCRIPTION OF PROCEDURE: Kyle Novak is a 53 y.o.-year-old male with right knee medial meniscus tear. Plans are to proceed with partial medial meniscectomy and diagnostic arthroscopy with debridement as indicated. Full discussion held regarding risks benefits alternatives and complications related surgical intervention. Conservative care options reviewed. All questions answered.  The patient was identified in the preoperative holding area and the operative extremity was marked. The patient was brought to the operating room and transferred to operating table in a supine position. Satisfactory general anesthesia was induced by anesthesiology.    Standard anterolateral, anteromedial arthroscopy portals were obtained. The anteromedial portal was obtained with a spinal needle for localization under direct visualization with subsequent diagnostic findings.   Incisions were made for knee arthroscopy portals.  Diagnostic knee arthroscopy was performed along with major synovectomy in all 3 compartments using oscillating shaver.  We then reposition the arthroscope into the medial compartment with the knee in a valgus stress we opened up the medial compartment.  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  We probed the meniscus along its entire length and particularly where the tear was expected to be based on the MRI.  I was  unable to identify any unstable tears or flaps of the meniscus based on probing.  I probed on the undersurface as well as the top surface and was unable to identify any tears.  The rest of the knee arthroscopy was unremarkable.  Excess fluid was removed from the knee joint.  Incisions were closed with interrupted nylon sutures.  Sterile dressings were applied.  Patient tolerated procedure well had no immediate complications.  Suprapatellar pouch and gutters: mild synovitis or debris. Patella chondral surface: Grade 0 Trochlear chondral surface: Grade 0 Patellofemoral tracking: Normal Medial meniscus: Unremarkable.  Medial femoral condyle weight bearing surface: Grade 1 Medial tibial plateau: Grade 0 Anterior cruciate ligament:stable Posterior cruciate ligament:stable Lateral meniscus: Normal.   Lateral femoral condyle weight bearing surface: Grade 0 Lateral tibial plateau: Grade 0  DISPOSITION: The patient was awakened from general anesthetic, extubated, taken to the recovery room in medically stable condition, no apparent complications. The patient may be weightbearing as tolerated to the operative lower extremity.  Range of motion of right knee as tolerated.  Azucena Cecil, MD [phone] 10:37 AM

## 2018-11-23 NOTE — Discharge Instructions (Signed)
° ° °Post-operative patient instructions  °Knee Arthroscopy  ° °• Ice:  Place intermittent ice or cooler pack over your knee, 30 minutes on and 30 minutes off.  Continue this for the first 72 hours after surgery, then save ice for use after therapy sessions or on more active days.   °• Weight:  You may bear weight on your leg as your symptoms allow. °• Crutches:  Use crutches (or walker) to assist in walking until told to discontinue by your physical therapist or physician. This will help to reduce pain. °• Strengthening:  Perform simple thigh squeezes (isometric quad contractions) and straight leg lifts as you are able (3 sets of 5 to 10 repetitions, 3 times a day).  For the leg lifts, have someone support under your ankle in the beginning until you have increased strength enough to do this on your own.  To help get started on thigh squeezes, place a pillow under your knee and push down on the pillow with back of knee (sometimes easier to do than with your leg fully straight). °• Motion:  Perform gentle knee motion as tolerated - this is gentle bending and straightening of the knee. Seated heel slides: you can start by sitting in a chair, remove your brace, and gently slide your heel back on the floor - allowing your knee to bend. Have someone help you straighten your knee (or use your other leg/foot hooked under your ankle.  °• Dressing:  Perform 1st dressing change at 2 days postoperative. A moderate amount of blood tinged drainage is to be expected.  So if you bleed through the dressing on the first or second day or if you have fevers, it is fine to change the dressing/check the wounds early and redress wound. Elevate your leg.  If it bleeds through again, or if the incisions are leaking frank blood, please call the office. May change dressing every 1-2 days thereafter to help watch wounds. Can purchase Tegaderm (or 3M Nexcare) water resistant dressings at local pharmacy / [facility]. °• Shower:  Light shower is  ok after 2 days.  Please take shower, NO bath. Recover with gauze and ace wrap to help keep wounds protected.   °• Pain medication:  A narcotic pain medication has been prescribed.  Take as directed.  Typically you need narcotic pain medication more regularly during the first 3 to 5 days after surgery.  Decrease your use of the medication as the pain improves.  Narcotics can sometimes cause constipation, even after a few doses.  If you have problems with constipation, you can take an over the counter stool softener or light laxative.  If you have persistent problems, please notify your physician’s office. °• Physical therapy: Additional activity guidelines to be provided by your physician or physical therapist at follow-up visits.  °• Driving: Do not recommend driving x 2 weeks post surgical, especially if surgery performed on right side. Should not drive while taking narcotic pain medications. It typically takes at least 2 weeks to restore sufficient neuromuscular function for normal reaction times for driving safety.  °• Call [phone] for questions or problems. Evenings you will be forwarded to the hospital operator.  Ask for the orthopaedic physician on call. Please call if you experience:  °  °o Redness, foul smelling, or persistent drainage from the surgical site  °o worsening knee pain and swelling not responsive to medication  °o any calf pain and or swelling of the lower leg  °o temperatures greater than   101.5 F o other questions or concerns   Thank you for allowing [place] to be a part of your care.     May take next dose of Tylenol at 3:00 PM on 11/23/2018 if needed.     Post Anesthesia Home Care Instructions  Activity: Get plenty of rest for the remainder of the day. A responsible individual must stay with you for 24 hours following the procedure.  For the next 24 hours, DO NOT: -Drive a car -Paediatric nurse -Drink alcoholic beverages -Take any medication unless instructed by your  physician -Make any legal decisions or sign important papers.  Meals: Start with liquid foods such as gelatin or soup. Progress to regular foods as tolerated. Avoid greasy, spicy, heavy foods. If nausea and/or vomiting occur, drink only clear liquids until the nausea and/or vomiting subsides. Call your physician if vomiting continues.  Special Instructions/Symptoms: Your throat may feel dry or sore from the anesthesia or the breathing tube placed in your throat during surgery. If this causes discomfort, gargle with warm salt water. The discomfort should disappear within 24 hours.  If you had a scopolamine patch placed behind your ear for the management of post- operative nausea and/or vomiting:  1. The medication in the patch is effective for 72 hours, after which it should be removed.  Wrap patch in a tissue and discard in the trash. Wash hands thoroughly with soap and water. 2. You may remove the patch earlier than 72 hours if you experience unpleasant side effects which may include dry mouth, dizziness or visual disturbances. 3. Avoid touching the patch. Wash your hands with soap and water after contact with the patch.

## 2018-11-23 NOTE — H&P (Signed)
PREOPERATIVE H&P  Chief Complaint: right knee medial meniscal tear  HPI: Kyle Novak is a 53 y.o. male who presents for surgical treatment of right knee medial meniscal tear.  He denies any changes in medical history.  Past Medical History:  Diagnosis Date  . Diabetes mellitus without complication ([place])   . GERD (gastroesophageal reflux disease)   . Hypertension    Past Surgical History:  Procedure Laterality Date  . NO PAST SURGERIES    . WISDOM TOOTH EXTRACTION     Social History   Socioeconomic History  . Marital status: Single    Spouse name: Not on file  . Number of children: Not on file  . Years of education: Not on file  . Highest education level: Not on file  Occupational History  . Not on file  Social Needs  . Financial resource strain: Not on file  . Food insecurity    Worry: Not on file    Inability: Not on file  . Transportation needs    Medical: Not on file    Non-medical: Not on file  Tobacco Use  . Smoking status: Current Every Day Smoker    Packs/day: 0.50    Types: Cigarettes  . Smokeless tobacco: Never Used  Substance and Sexual Activity  . Alcohol use: Yes    Comment: OCCASIONAL  . Drug use: No  . Sexual activity: Not on file  Lifestyle  . Physical activity    Days per week: Not on file    Minutes per session: Not on file  . Stress: Not on file  Relationships  . Social Herbalist on phone: Not on file    Gets together: Not on file    Attends religious service: Not on file    Active member of club or organization: Not on file    Attends meetings of clubs or organizations: Not on file    Relationship status: Not on file  Other Topics Concern  . Not on file  Social History Narrative  . Not on file   Family History  Problem Relation Age of Onset  . Lung cancer Father   . Heart failure Brother    No Known Allergies Prior to Admission medications   Medication Sig Start Date End Date Taking? Authorizing Provider   amLODipine (NORVASC) 10 MG tablet Take 1 tablet (10 mg total) by mouth daily. 09/20/18  Yes Kerin Perna, NP  diclofenac sodium (VOLTAREN) 1 % GEL Apply 4 g topically 4 (four) times daily. For knee pain and swelling 03/30/18  Yes Kerin Perna, NP  DULoxetine (CYMBALTA) 60 MG capsule Take 1 capsule (60 mg total) by mouth daily. 09/20/18  Yes Kerin Perna, NP  gabapentin (NEURONTIN) 600 MG tablet Take 2 tablets (1,200 mg total) by mouth 3 (three) times daily. 09/20/18  Yes Kerin Perna, NP  glipiZIDE (GLUCOTROL) 10 MG tablet Take 1 tablet (10 mg total) by mouth 2 (two) times daily before a meal. 09/20/18  Yes Kerin Perna, NP  hydrOXYzine (VISTARIL) 25 MG capsule Take 1 capsule (25 mg total) by mouth 3 (three) times daily as needed. 09/20/18  Yes Kerin Perna, NP  losartan (COZAAR) 50 MG tablet Take 1 tablet (50 mg total) by mouth daily. 09/20/18  Yes Kerin Perna, NP  metFORMIN (GLUCOPHAGE) 1000 MG tablet Take 1 tablet (1,000 mg total) by mouth 2 (two) times daily with a meal. 09/20/18  Yes Kerin Perna, NP  Multiple Vitamin (MULITIVITAMIN WITH MINERALS) TABS Take 1 tablet by mouth daily.   Yes [provider]  omeprazole (PRILOSEC) 20 MG capsule Take 1 capsule (20 mg total) by mouth daily. 09/20/18  Yes Kerin Perna, NP     Positive ROS: All other systems have been reviewed and were otherwise negative with the exception of those mentioned in the HPI and as above.  Physical Exam: General: Alert, no acute distress Cardiovascular: No pedal edema Respiratory: No cyanosis, no use of accessory musculature GI: abdomen soft Skin: No lesions in the area of chief complaint Neurologic: Sensation intact distally Psychiatric: Patient is competent for consent with normal mood and affect Lymphatic: no lymphedema  MUSCULOSKELETAL: exam stable  Assessment: right knee medial meniscal tear  Plan: Plan for Procedure(s): RIGHT KNEE ARTHROSCOPY WITH  PARTIAL MEDIAL MENISCECTOMY, SYNOVECTOMY  The risks benefits and alternatives were discussed with the patient including but not limited to the risks of nonoperative treatment, versus surgical intervention including infection, bleeding, nerve injury,  blood clots, cardiopulmonary complications, morbidity, mortality, among others, and they were willing to proceed.   Eduard Roux, MD   11/23/2018 9:09 AM

## 2018-11-23 NOTE — Transfer of Care (Signed)
Immediate Anesthesia Transfer of Care Note  Patient: Kyle Novak  Procedure(s) Performed: Procedure(s) (LRB): RIGHT KNEE ARTHROSCOPY WITH PARTIAL MEDIAL MENISCECTOMY, SYNOVECTOMY (Right)  Patient Location: PACU  Anesthesia Type: General  Level of Consciousness: awake, oriented, sedated and patient cooperative  Airway & Oxygen Therapy: Patient Spontanous Breathing and Patient connected to face mask oxygen  Post-op Assessment: Report given to PACU RN and Post -op Vital signs reviewed and stable  Post vital signs: Reviewed and stable  Complications: No apparent anesthesia complications Last Vitals:  Vitals Value Taken Time  BP 103/83 11/23/18 1047  Temp    Pulse 81 11/23/18 1050  Resp 10 11/23/18 1050  SpO2 97 % 11/23/18 1050  Vitals shown include unvalidated device data.  Last Pain:  Vitals:   11/23/18 0837  TempSrc: Oral  PainSc: 0-No pain      Patients Stated Pain Goal: 3 (11/23/18 [id number])

## 2018-11-23 NOTE — Anesthesia Procedure Notes (Signed)
Procedure Name: LMA Insertion Date/Time: 11/23/2018 10:08 AM Performed by: Suan Halter, CRNA Pre-anesthesia Checklist: Patient identified, Emergency Drugs available, Suction available and Patient being monitored Patient Re-evaluated:Patient Re-evaluated prior to induction Oxygen Delivery Method: Circle system utilized Preoxygenation: Pre-oxygenation with 100% oxygen Induction Type: IV induction Ventilation: Mask ventilation without difficulty LMA: LMA inserted LMA Size: 4.0 Number of attempts: 1 Airway Equipment and Method: Bite block Placement Confirmation: positive ETCO2 Tube secured with: Tape Dental Injury: Teeth and Oropharynx as per pre-operative assessment

## 2018-11-23 NOTE — Anesthesia Preprocedure Evaluation (Addendum)
Anesthesia Evaluation  Patient identified by MRN, date of birth, ID band Patient awake    Reviewed: Allergy & Precautions, NPO status , Patient's Chart, lab work & pertinent test results  Airway Mallampati: I  TM Distance: >3 FB Neck ROM: Full    Dental  (+) Dental Advisory Given, Teeth Intact,    Pulmonary Current SmokerPatient did not abstain from smoking.,    Pulmonary exam normal breath sounds clear to auscultation       Cardiovascular hypertension, Pt. on medications Normal cardiovascular exam Rhythm:Regular Rate:Normal     Neuro/Psych Hx of spinal cord mass negative psych ROS   GI/Hepatic Neg liver ROS, GERD  Medicated and Controlled,  Endo/Other  diabetes, Type 2, Oral Hypoglycemic Agents  Renal/GU negative Renal ROS  negative genitourinary   Musculoskeletal negative musculoskeletal ROS (+)   Abdominal Normal abdominal exam  (+)   Peds  Hematology negative hematology ROS (+)   Anesthesia Other Findings   Reproductive/Obstetrics negative OB ROS                            Anesthesia Physical Anesthesia Plan  ASA: III  Anesthesia Plan: General   Post-op Pain Management:    Induction:   PONV Risk Score and Plan: 1 and Ondansetron, Dexamethasone, Treatment may vary due to age or medical condition and Midazolam  Airway Management Planned: LMA  Additional Equipment: None  Intra-op Plan:   Post-operative Plan: Extubation in OR  Informed Consent: I have reviewed the patients History and Physical, chart, labs and discussed the procedure including the risks, benefits and alternatives for the proposed anesthesia with the patient or authorized representative who has indicated his/her understanding and acceptance.     Dental advisory given  Plan Discussed with: CRNA  Anesthesia Plan Comments:         Anesthesia Quick Evaluation

## 2018-11-23 NOTE — Anesthesia Postprocedure Evaluation (Signed)
Anesthesia Post Note  Patient: Kyle Novak  Procedure(s) Performed: RIGHT KNEE ARTHROSCOPY WITH PARTIAL MEDIAL MENISCECTOMY, SYNOVECTOMY (Right Knee)     Patient location during evaluation: PACU Anesthesia Type: General Level of consciousness: awake and alert, oriented and patient cooperative Pain management: pain level controlled Vital Signs Assessment: post-procedure vital signs reviewed and stable Respiratory status: spontaneous breathing, nonlabored ventilation and respiratory function stable Cardiovascular status: blood pressure returned to baseline and stable Postop Assessment: no apparent nausea or vomiting Anesthetic complications: no    Last Vitals:  Vitals:   11/23/18 1130 11/23/18 1150  BP: 121/89 123/83  Pulse: 68 70  Resp: 12 16  Temp:  36.7 C  SpO2: 100% 98%    Last Pain:  Vitals:   11/23/18 1150  TempSrc:   PainSc: 0-No pain                 Jarome Matin Trelon Plush

## 2018-11-28 ENCOUNTER — Encounter (HOSPITAL_BASED_OUTPATIENT_CLINIC_OR_DEPARTMENT_OTHER): Payer: Self-pay | Admitting: Orthopaedic Surgery

## 2018-11-30 ENCOUNTER — Encounter: Payer: Self-pay | Admitting: Orthopaedic Surgery

## 2018-11-30 ENCOUNTER — Other Ambulatory Visit: Payer: Self-pay

## 2018-11-30 ENCOUNTER — Ambulatory Visit (INDEPENDENT_AMBULATORY_CARE_PROVIDER_SITE_OTHER): Payer: Medicaid Other | Admitting: Physician Assistant

## 2018-11-30 ENCOUNTER — Other Ambulatory Visit: Payer: Self-pay | Admitting: Physician Assistant

## 2018-11-30 DIAGNOSIS — Z9889 Other specified postprocedural states: Secondary | ICD-10-CM

## 2018-11-30 MED ORDER — HYDROCODONE-ACETAMINOPHEN 5-325 MG PO TABS: 1.0000 | ORAL_TABLET | Freq: Two times a day (BID) | ORAL | 0 refills | Status: DC | PRN

## 2018-11-30 NOTE — Progress Notes (Signed)
   Post-Op Visit Note   Patient: Kyle Novak           Date of Birth: November 19, 1965           MRN: [id number] Visit Date: 11/30/2018 PCP: Kerin Perna, NP   Assessment & Plan:  Chief Complaint:  Chief Complaint  Patient presents with  . Right Knee - Routine Post Op, Follow-up   Visit Diagnoses:  1. S/P right knee arthroscopy     Plan: Patient is a pleasant 53 year old gentleman who presents to our clinic today 1 week status post right knee arthroscopic synovectomy and chondroplasty medial femoral condyle.  He has been doing well.  He has minimal pain.  No fevers or chills.  Examination of his right knee reveals well-healing surgical incisions with nylon sutures in place.  No evidence of infection or cellulitis.  His calf is soft nontender.  Today, nylon sutures were removed.  I have provided him with postoperative knee exercises.  He will follow-up with [place] in 5 weeks time for recheck.  Call with concerns or questions in the meantime.  Follow-Up Instructions: Return in about 5 weeks (around 01/04/2019).   Orders:  No orders of the defined types were placed in this encounter.  No orders of the defined types were placed in this encounter.   Imaging: No new imaging  PMFS History: Patient Active Problem List   Diagnosis Date Noted  . Acute medial meniscus tear of right knee 11/15/2018  . Chronic pain of right knee 03/22/2018  . 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 Spinal cord mass ([place]) 03/16/2017  . Diabetes mellitus without complication ([place])   . Hypertension    Past Medical History:  Diagnosis Date  . Diabetes mellitus without complication ([place])   . GERD (gastroesophageal reflux disease)   . Hypertension     Family History  Problem Relation Age of Onset  . Lung cancer Father   . Heart failure Brother     Past Surgical History:  Procedure Laterality Date  . KNEE ARTHROSCOPY WITH MEDIAL MENISECTOMY Right 11/23/2018   Procedure: RIGHT KNEE ARTHROSCOPY WITH  PARTIAL MEDIAL MENISCECTOMY, SYNOVECTOMY;  Surgeon: Leandrew Koyanagi, MD;  Location: [facility];  Service: Orthopedics;  Laterality: Right;  . NO PAST SURGERIES    . WISDOM TOOTH EXTRACTION     Social History   Occupational History  . Not on file  Tobacco Use  . Smoking status: Current Every Day Smoker    Packs/day: 0.50    Types: Cigarettes  . Smokeless tobacco: Never Used  Substance and Sexual Activity  . Alcohol use: Yes    Comment: OCCASIONAL  . Drug use: No  . Sexual activity: Not on file

## 2018-12-19 ENCOUNTER — Other Ambulatory Visit (INDEPENDENT_AMBULATORY_CARE_PROVIDER_SITE_OTHER): Payer: Self-pay | Admitting: Primary Care

## 2018-12-20 ENCOUNTER — Ambulatory Visit (INDEPENDENT_AMBULATORY_CARE_PROVIDER_SITE_OTHER): Payer: Medicaid Other | Admitting: Primary Care

## 2018-12-27 ENCOUNTER — Other Ambulatory Visit: Payer: Self-pay

## 2018-12-27 ENCOUNTER — Encounter (INDEPENDENT_AMBULATORY_CARE_PROVIDER_SITE_OTHER): Payer: Self-pay | Admitting: Primary Care

## 2018-12-27 ENCOUNTER — Ambulatory Visit (INDEPENDENT_AMBULATORY_CARE_PROVIDER_SITE_OTHER): Payer: Medicaid Other | Admitting: Primary Care

## 2018-12-27 VITALS — BP 119/84 | HR 98 | Temp 97.3°F | Ht 69.0 in | Wt 185.8 lb

## 2018-12-27 DIAGNOSIS — I10 Essential (primary) hypertension: Secondary | ICD-10-CM

## 2018-12-27 DIAGNOSIS — E119 Type 2 diabetes mellitus without complications: Secondary | ICD-10-CM | POA: Diagnosis not present

## 2018-12-27 LAB — POCT GLYCOSYLATED HEMOGLOBIN (HGB A1C): Hemoglobin A1C: 7 % — AB (ref 4.0–5.6)

## 2018-12-27 LAB — GLUCOSE, POCT (MANUAL RESULT ENTRY): POC Glucose: 265 mg/dl — AB (ref 70–99)

## 2018-12-27 NOTE — Patient Instructions (Signed)
Diabetes Mellitus and Exercise Exercising regularly is important for your overall health, especially when you have diabetes (diabetes mellitus). Exercising is not only about losing weight. It has many other health benefits, such as increasing muscle strength and bone density and reducing body fat and stress. This leads to improved fitness, flexibility, and endurance, all of which result in better overall health. Exercise has additional benefits for people with diabetes, including:  Reducing appetite.  Helping to lower and control blood glucose.  Lowering blood pressure.  Helping to control amounts of fatty substances (lipids) in the blood, such as cholesterol and triglycerides.  Helping the body to respond better to insulin (improving insulin sensitivity).  Reducing how much insulin the body needs.  Decreasing the risk for heart disease by: ? Lowering cholesterol and triglyceride levels. ? Increasing the levels of good cholesterol. ? Lowering blood glucose levels. What is my activity plan? Your health care provider or certified diabetes educator can help you make a plan for the type and frequency of exercise (activity plan) that works for you. Make sure that you:  Do at least 150 minutes of moderate-intensity or vigorous-intensity exercise each week. This could be brisk walking, biking, or water aerobics. ? Do stretching and strength exercises, such as yoga or weightlifting, at least 2 times a week. ? Spread out your activity over at least 3 days of the week.  Get some form of physical activity every day. ? Do not go more than 2 days in a row without some kind of physical activity. ? Avoid being inactive for more than 30 minutes at a time. Take frequent breaks to walk or stretch.  Choose a type of exercise or activity that you enjoy, and set realistic goals.  Start slowly, and gradually increase the intensity of your exercise over time. What do I need to know about managing my  diabetes?   Check your blood glucose before and after exercising. ? If your blood glucose is 240 mg/dL (13.3 mmol/L) or higher before you exercise, check your urine for ketones. If you have ketones in your urine, do not exercise until your blood glucose returns to normal. ? If your blood glucose is 100 mg/dL (5.6 mmol/L) or lower, eat a snack containing 15-20 grams of carbohydrate. Check your blood glucose 15 minutes after the snack to make sure that your level is above 100 mg/dL (5.6 mmol/L) before you start your exercise.  Know the symptoms of low blood glucose (hypoglycemia) and how to treat it. Your risk for hypoglycemia increases during and after exercise. Common symptoms of hypoglycemia can include: ? Hunger. ? Anxiety. ? Sweating and feeling clammy. ? Confusion. ? Dizziness or feeling light-headed. ? Increased heart rate or palpitations. ? Blurry vision. ? Tingling or numbness around the mouth, lips, or tongue. ? Tremors or shakes. ? Irritability.  Keep a rapid-acting carbohydrate snack available before, during, and after exercise to help prevent or treat hypoglycemia.  Avoid injecting insulin into areas of the body that are going to be exercised. For example, avoid injecting insulin into: ? The arms, when playing tennis. ? The legs, when jogging.  Keep records of your exercise habits. Doing this can help you and your health care provider adjust your diabetes management plan as needed. Write down: ? Food that you eat before and after you exercise. ? Blood glucose levels before and after you exercise. ? The type and amount of exercise you have done. ? When your insulin is expected to peak, if you use   insulin. Avoid exercising at times when your insulin is peaking.  When you start a new exercise or activity, work with your health care provider to make sure the activity is safe for you, and to adjust your insulin, medicines, or food intake as needed.  Drink plenty of water while  you exercise to prevent dehydration or heat stroke. Drink enough fluid to keep your urine clear or pale yellow. Summary  Exercising regularly is important for your overall health, especially when you have diabetes (diabetes mellitus).  Exercising has many health benefits, such as increasing muscle strength and bone density and reducing body fat and stress.  Your health care provider or certified diabetes educator can help you make a plan for the type and frequency of exercise (activity plan) that works for you.  When you start a new exercise or activity, work with your health care provider to make sure the activity is safe for you, and to adjust your insulin, medicines, or food intake as needed. This information is not intended to replace advice given to you by your health care provider. Make sure you discuss any questions you have with your health care provider. Document Released: 04/25/2003 Document Revised: 08/27/2016 Document Reviewed: 07/15/2015 Elsevier Patient Education  2020 [employer]  

## 2018-12-27 NOTE — Progress Notes (Signed)
Established Patient Office Visit  Subjective:  Patient ID: Kyle Novak, male    DOB: [birth year]  Age: 53 y.o. MRN: [id number]  CC:  Chief Complaint  Patient presents with  . Diabetes    HPI Kyle Novak presents for management of diabetes. Last A1C was 7.1 (3 months ago) today he is 7.0 . Patient voices would like to be off metformin will reduce dosage to 1/2 (500mg  ) twice a day and continue glucotrol 10mg  bid   Past Medical History:  Diagnosis Date  . Diabetes mellitus without complication ([place])   . GERD (gastroesophageal reflux disease)   . Hypertension     Past Surgical History:  Procedure Laterality Date  . KNEE ARTHROSCOPY WITH MEDIAL MENISECTOMY Right 11/23/2018   Procedure: RIGHT KNEE ARTHROSCOPY WITH PARTIAL MEDIAL MENISCECTOMY, SYNOVECTOMY;  Surgeon: Leandrew Koyanagi, MD;  Location: [facility];  Service: Orthopedics;  Laterality: Right;  . NO PAST SURGERIES    . WISDOM TOOTH EXTRACTION      Family History  Problem Relation Age of Onset  . Lung cancer Father   . Heart failure Brother     Social History   Socioeconomic History  . Marital status: Single    Spouse name: Not on file  . Number of children: Not on file  . Years of education: Not on file  . Highest education level: Not on file  Occupational History  . Not on file  Social Needs  . Financial resource strain: Not on file  . Food insecurity    Worry: Not on file    Inability: Not on file  . Transportation needs    Medical: Not on file    Non-medical: Not on file  Tobacco Use  . Smoking status: Current Every Day Smoker    Packs/day: 0.50    Types: Cigarettes  . Smokeless tobacco: Never Used  Substance and Sexual Activity  . Alcohol use: Yes    Comment: OCCASIONAL  . Drug use: No  . Sexual activity: Not on file  Lifestyle  . Physical activity    Days per week: Not on file    Minutes per session: Not on file  . Stress: Not on file  Relationships  . Social  Herbalist on phone: Not on file    Gets together: Not on file    Attends religious service: Not on file    Active member of club or organization: Not on file    Attends meetings of clubs or organizations: Not on file    Relationship status: Not on file  . Intimate partner violence    Fear of current or ex partner: Not on file    Emotionally abused: Not on file    Physically abused: Not on file    Forced sexual activity: Not on file  Other Topics Concern  . Not on file  Social History Narrative  . Not on file    Outpatient Medications Prior to Visit  Medication Sig Dispense Refill  . amLODipine (NORVASC) 10 MG tablet Take 1 tablet (10 mg total) by mouth daily. 30 tablet 5  . DULoxetine (CYMBALTA) 60 MG capsule Take 1 capsule (60 mg total) by mouth daily. 30 capsule 5  . gabapentin (NEURONTIN) 600 MG tablet Take 2 tablets (1,200 mg total) by mouth 3 (three) times daily. 180 tablet 5  . glipiZIDE (GLUCOTROL) 10 MG tablet Take 1 tablet (10 mg total) by mouth 2 (two) times daily before a  meal. 60 tablet 3  . losartan (COZAAR) 50 MG tablet Take 1 tablet (50 mg total) by mouth daily. 30 tablet 5  . metFORMIN (GLUCOPHAGE) 1000 MG tablet Take 1 tablet (1,000 mg total) by mouth 2 (two) times daily with a meal. 180 tablet 3  . Multiple Vitamin (MULITIVITAMIN WITH MINERALS) TABS Take 1 tablet by mouth daily.    Marland Kitchen omeprazole (PRILOSEC) 20 MG capsule TAKE 1 CAPSULE (20 MG TOTAL) BY MOUTH DAILY. 30 capsule 3  . traMADol (ULTRAM) 50 MG tablet Take 50 mg by mouth as needed.    . diclofenac sodium (VOLTAREN) 1 % GEL Apply 4 g topically 4 (four) times daily. For knee pain and swelling (Patient not taking: Reported on 12/27/2018) 2 Tube 6  . HYDROcodone-acetaminophen (NORCO) 5-325 MG tablet Take 1 tablet by mouth 2 (two) times daily as needed for moderate pain. (Patient not taking: Reported on 12/27/2018) 20 tablet 0  . hydrOXYzine (VISTARIL) 25 MG capsule Take 1 capsule (25 mg total) by mouth  3 (three) times daily as needed. 30 capsule 0  . ketorolac (TORADOL) 10 MG tablet Take 1 tablet (10 mg total) by mouth 2 (two) times daily as needed. 10 tablet 0  . ondansetron (ZOFRAN) 4 MG tablet Take 1-2 tablets (4-8 mg total) by mouth every 8 (eight) hours as needed for nausea or vomiting. 40 tablet 0   Facility-Administered Medications Prior to Visit  Medication Dose Route Frequency Provider Last Rate Last Dose  . acetaminophen (TYLENOL) tablet 650 mg  650 mg Oral Q4H PRN Lorriane Shire, MD        No Known Allergies  ROS Review of Systems  Musculoskeletal:       Right knee pain s/p arthroscopy   All other systems reviewed and are negative.     Objective:    Physical Exam  Constitutional: He is oriented to person, place, and time. He appears well-developed and well-nourished.  Neck: Neck supple.  Cardiovascular: Normal rate and regular rhythm.  Pulmonary/Chest: Effort normal and breath sounds normal.  Abdominal: Soft. Bowel sounds are normal. He exhibits distension.  Musculoskeletal:        General: Tenderness present.  Neurological: He is oriented to person, place, and time.  Psychiatric: He has a normal mood and affect.    BP 119/84 (BP Location: Left Arm, Patient Position: Sitting, Cuff Size: Normal)   Pulse 98   Temp (!) 97.3 F (36.3 C) (Temporal)   Ht 5\' 9"  (1.753 m)   Wt 185 lb 12.8 oz (84.3 kg)   SpO2 97%   BMI 27.44 kg/m  Wt Readings from Last 3 Encounters:  12/27/18 185 lb 12.8 oz (84.3 kg)  11/23/18 185 lb 13.6 oz (84.3 kg)  11/09/18 180 lb (81.6 kg)     Health Maintenance Due  Topic Date Due  . FOOT EXAM  12/31/1975  . OPHTHALMOLOGY EXAM  12/31/1975    There are no preventive care reminders to display for this patient.  No results found for: TSH Lab Results  Component Value Date   WBC 9.8 09/22/2018   HGB 14.3 09/22/2018   HCT 43.3 09/22/2018   MCV 84 09/22/2018   PLT 343 09/22/2018   Lab Results  Component Value Date   NA 139  11/17/2018   K 4.8 11/17/2018   CO2 24 11/17/2018   GLUCOSE 183 (H) 11/17/2018   BUN 12 11/17/2018   CREATININE 0.82 11/17/2018   BILITOT 0.3 09/22/2018   ALKPHOS 134 (H) 09/22/2018  AST 18 09/22/2018   ALT 30 09/22/2018   PROT 6.9 09/22/2018   ALBUMIN 4.3 09/22/2018   CALCIUM 9.6 11/17/2018   ANIONGAP 11 11/17/2018   Lab Results  Component Value Date   CHOL 142 09/22/2018   Lab Results  Component Value Date   HDL 43 09/22/2018   Lab Results  Component Value Date   LDLCALC 82 09/22/2018   Lab Results  Component Value Date   TRIG 86 09/22/2018   Lab Results  Component Value Date   CHOLHDL 3.3 09/22/2018   Lab Results  Component Value Date   HGBA1C 7.0 (A) 12/27/2018      Assessment & Plan:   Jaquarion was seen today for diabetes.  Diagnoses and all orders for this visit:  Diabetes mellitus without complication (HCC) -     HgB A1c 7.0- 3 months ago 7.1 minimal reduction. Discussed foods that are high in carbohydrates are the following rice, potatoes, breads, sugars, and pastas.  Reduction in the intake (eating) will assist in lowering your blood sugars. Exercising daily 76mins or 150 weekly vigorously    Hypertension, unspecified type Well controlled on amlodipine 10mg  and losartan 50mg  daily bp today 119/84 at GOAL  No orders of the defined types were placed in this encounter.   Follow-up: No follow-ups on file.    Kerin Perna, NP

## 2019-01-02 ENCOUNTER — Encounter (INDEPENDENT_AMBULATORY_CARE_PROVIDER_SITE_OTHER): Payer: Self-pay | Admitting: Primary Care

## 2019-01-04 ENCOUNTER — Other Ambulatory Visit: Payer: Self-pay

## 2019-01-04 ENCOUNTER — Encounter: Payer: Self-pay | Admitting: Orthopaedic Surgery

## 2019-01-04 ENCOUNTER — Ambulatory Visit (INDEPENDENT_AMBULATORY_CARE_PROVIDER_SITE_OTHER): Payer: Medicaid Other | Admitting: Physician Assistant

## 2019-01-04 DIAGNOSIS — Z9889 Other specified postprocedural states: Secondary | ICD-10-CM

## 2019-01-04 MED ORDER — DICLOFENAC SODIUM 1 % EX GEL: 2.0000 g | Freq: Four times a day (QID) | CUTANEOUS | 1 refills | Status: DC

## 2019-01-04 MED ORDER — HYDROCODONE-ACETAMINOPHEN 5-325 MG PO TABS: 1.0000 | ORAL_TABLET | Freq: Every day | ORAL | 0 refills | Status: DC | PRN

## 2019-01-04 NOTE — Progress Notes (Signed)
Post-Op Visit Note   Patient: Kyle Novak           Date of Birth: Jun 06, 1965           MRN: [id number] Visit Date: 01/04/2019 PCP: Kerin Perna, NP   Assessment & Plan:  Chief Complaint:  Chief Complaint  Patient presents with  . Right Knee - Pain, Follow-up, Routine Post Op   Visit Diagnoses:  1. S/P right knee arthroscopy     Plan: Patient is a pleasant 53 year old gentleman who presents our clinic today 6 weeks status post right knee arthroscopic debridement and major synovectomy, date of surgery 11/23/2018.  He has been doing okay.  He has been very slow to progress since surgical intervention.  He went to formal physical therapy a few times and then realized he could do the the exercises on his own at home.  He has been working on these over the past few weeks.  No fevers or chills.  He notes a fair amount of weakness to the right lower extremity.  Examination of his right knee reveals a trace effusion.  Range of motion 0 to 100 degrees.  4 out of 5 strength with resisted straight leg raise.  He is neurovascular intact distally.  At this point, the patient has agreed to return to physical therapy to work on strengthening exercises.  An internal referral has been sent in epic.  I have refilled his hydrocodone once more.  He will follow-up with [place] in 6 weeks time for recheck.  Call with concerns or questions.  Follow-Up Instructions: Return in about 6 weeks (around 02/15/2019).   Orders:  Orders Placed This Encounter  Procedures  . Ambulatory referral to Physical Therapy   Meds ordered this encounter  Medications  . diclofenac Sodium (VOLTAREN) 1 % GEL    Sig: Apply 2 g topically 4 (four) times daily.    Dispense:  150 g    Refill:  1  . HYDROcodone-acetaminophen (NORCO) 5-325 MG tablet    Sig: Take 1-2 tablets by mouth daily as needed for moderate pain.    Dispense:  20 tablet    Refill:  0    Imaging: No new imaging   PMFS History: Patient Active  Problem List   Diagnosis Date Noted  . Acute medial meniscus tear of right knee 11/15/2018  . Chronic pain of right knee 03/22/2018  . 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 Diabetes mellitus without complication ([place])   . Hypertension    Past Medical History:  Diagnosis Date  . Diabetes mellitus without complication ([place])   . GERD (gastroesophageal reflux disease)   . Hypertension     Family History  Problem Relation Age of Onset  . Lung cancer Father   . Heart failure Brother     Past Surgical History:  Procedure Laterality Date  . KNEE ARTHROSCOPY WITH MEDIAL MENISECTOMY Right 11/23/2018   Procedure: RIGHT KNEE ARTHROSCOPY WITH PARTIAL MEDIAL MENISCECTOMY, SYNOVECTOMY;  Surgeon: Leandrew Koyanagi, MD;  Location: [facility];  Service: Orthopedics;  Laterality: Right;  . NO PAST SURGERIES    . WISDOM TOOTH EXTRACTION     Social History   Occupational History  . Not on file  Tobacco Use  . Smoking status: Current Every Day Smoker    Packs/day: 0.50    Types: Cigarettes  . Smokeless tobacco: Never Used  Substance and Sexual Activity  . Alcohol use: Yes  Comment: OCCASIONAL  . Drug use: No  . Sexual activity: Not on file

## 2019-01-05 ENCOUNTER — Ambulatory Visit: Payer: Medicaid Other | Attending: Physician Assistant | Admitting: Physical Therapy

## 2019-01-05 DIAGNOSIS — R600 Localized edema: Secondary | ICD-10-CM | POA: Insufficient documentation

## 2019-01-05 DIAGNOSIS — R2689 Other abnormalities of gait and mobility: Secondary | ICD-10-CM | POA: Insufficient documentation

## 2019-01-05 DIAGNOSIS — Z9889 Other specified postprocedural states: Secondary | ICD-10-CM | POA: Insufficient documentation

## 2019-01-05 DIAGNOSIS — G8929 Other chronic pain: Secondary | ICD-10-CM | POA: Insufficient documentation

## 2019-01-05 DIAGNOSIS — M25561 Pain in right knee: Secondary | ICD-10-CM | POA: Insufficient documentation

## 2019-01-05 DIAGNOSIS — M6281 Muscle weakness (generalized): Secondary | ICD-10-CM | POA: Insufficient documentation

## 2019-01-09 ENCOUNTER — Ambulatory Visit: Payer: Medicaid Other | Admitting: Physical Therapy

## 2019-01-11 ENCOUNTER — Other Ambulatory Visit: Payer: Self-pay

## 2019-01-11 ENCOUNTER — Ambulatory Visit: Payer: Medicaid Other | Admitting: Physical Therapy

## 2019-01-11 ENCOUNTER — Encounter: Payer: Self-pay | Admitting: Physical Therapy

## 2019-01-11 DIAGNOSIS — G8929 Other chronic pain: Secondary | ICD-10-CM

## 2019-01-11 DIAGNOSIS — Z9889 Other specified postprocedural states: Secondary | ICD-10-CM | POA: Diagnosis present

## 2019-01-11 DIAGNOSIS — R600 Localized edema: Secondary | ICD-10-CM | POA: Diagnosis present

## 2019-01-11 DIAGNOSIS — M6281 Muscle weakness (generalized): Secondary | ICD-10-CM

## 2019-01-11 DIAGNOSIS — M25561 Pain in right knee: Secondary | ICD-10-CM | POA: Diagnosis present

## 2019-01-11 DIAGNOSIS — R2689 Other abnormalities of gait and mobility: Secondary | ICD-10-CM | POA: Diagnosis present

## 2019-01-11 NOTE — Therapy (Signed)
[place], [place], [address] Phone: [phone]   Fax:  [phone]  Physical Therapy Evaluation  Patient Details  Name: Kyle Novak MRN: [id number] Date of Birth: 04/01/1965 Referring Provider (PT): Aundra Dubin, [place]   Encounter Date: 01/11/2019  PT End of Session - 01/11/19 1544    Visit Number  1    Number of Visits  13    Date for PT Re-Evaluation  03/08/19    Authorization Type  MCD: resubmit at 4th visit    PT Start Time  1545    PT Stop Time  1626    PT Time Calculation (min)  41 min    Activity Tolerance  Patient tolerated treatment well    Behavior During Therapy  [facility] for tasks assessed/performed       Past Medical History:  Diagnosis Date  . Diabetes mellitus without complication ([place])   . GERD (gastroesophageal reflux disease)   . Hypertension     Past Surgical History:  Procedure Laterality Date  . KNEE ARTHROSCOPY WITH MEDIAL MENISECTOMY Right 11/23/2018   Procedure: RIGHT KNEE ARTHROSCOPY WITH PARTIAL MEDIAL MENISCECTOMY, SYNOVECTOMY;  Surgeon: Leandrew Koyanagi, MD;  Location: [place];  Service: Orthopedics;  Laterality: Right;  . NO PAST SURGERIES    . WISDOM TOOTH EXTRACTION      There were no vitals filed for this visit.   Subjective Assessment - 01/11/19 1550    Subjective  pt is a 53 y.o M with CC of L knee pain following arthroscopic medial menisectomy on 11/23/2018. Since the surgery he reports the pain seems to be pretty high at a 5-6/10. He stated he felt he could do therapy on his own and held off on coming to PT but reported he felt he was getting better. He reports it still feels like it wants to buckle and snap back into extension.    Pertinent History  spinal tumor diagnosed on 03-15-17; hospitalization 03-15-17 - 03-19-17 with worsening gait and difficulty lifting feet off ground     Limitations  Walking    How long can you sit comfortably?   unlimited    How long can you stand comfortably?  10-15 min    How long can you walk comfortably?  10-15 min    Diagnostic tests  MRI 11/02/2018    Patient Stated Goals  want to get leg musles stronger so he doesn't have to wear braces    Currently in Pain?  Yes    Pain Score  6     Pain Location  Knee    Pain Orientation  Medial;Right    Pain Descriptors / Indicators  Aching;Sore    Pain Type  Chronic pain    Pain Onset  More than a month ago    Pain Frequency  Intermittent    Aggravating Factors   prolonged standing/ walking. Popping of the knee    Pain Relieving Factors  ice, compression sleeve    Effect of Pain on Daily Activities  limited walking/ standing,         [facility] PT Assessment - 01/11/19 1544      Assessment   Medical Diagnosis  /P right knee arthroscopy Z98.890    Referring Provider (PT)  Aundra Dubin, PA-C    Onset Date/Surgical Date  11/23/18    Hand Dominance  Right    Next MD Visit  unsure    Prior Therapy  yes  for foot drop     Precautions   Precautions  None      Restrictions   Weight Bearing Restrictions  No      Balance Screen   Has the patient fallen in the past 6 months  No      [place] residence    Living Arrangements  Spouse/significant other    Available Help at Discharge  Family    Type of [place] to enter    Entrance Stairs-Number of Steps  14    Entrance Stairs-Rails  Right   ascending   Home Layout  One level    [place] - single point;Crutches;Other (comment)   brace,      Prior Function   Level of Independence  Independent    Vocation  On disability      Cognition   Overall Cognitive Status  Within Functional Limits for tasks assessed      Observation/Other Assessments   Other Surveys   Lower Extremity Functional Scale    Lower Extremity Functional Scale   43/80      ROM / Strength   AROM / PROM / Strength  AROM;Strength;PROM       AROM   AROM Assessment Site  Knee    Right/Left Knee  Right;Left    Right Knee Extension  -8    Right Knee Flexion  125    Left Knee Extension  0    Left Knee Flexion  138      PROM   PROM Assessment Site  Knee    Right/Left Knee  Right    Right Knee Extension  2    Right Knee Flexion  128      Strength   Strength Assessment Site  Knee;Hip    Right/Left Hip  Right;Left    Right Hip Flexion  4+/5    Right Hip ABduction  4/5    Left Hip Flexion  4+/5    Left Hip ABduction  4/5    Right/Left Knee  Right;Left    Right Knee Flexion  3/5    Right Knee Extension  3/5    Left Knee Flexion  5/5    Left Knee Extension  5/5      Palpation   Palpation comment  TTP along the medial/ lateral joint line       Ambulation/Gait   Ambulation/Gait  Yes    Gait Pattern  Step-through pattern;Decreased stride length;Antalgic;Trendelenburg   using SPC in the RUE               Objective measurements completed on examination: See above findings.      [place] Adult PT Treatment/Exercise - 01/11/19 0001      Exercises   Exercises  Knee/Hip      Knee/Hip Exercises: Stretches   Active Hamstring Stretch  2 reps;30 seconds;Right      Knee/Hip Exercises: Supine   Quad Sets  1 set;Right;Strengthening;10 reps   with towel in popliteal space   Bridges  1 set;10 reps    Straight Leg Raises  1 set;10 reps;Right;Strengthening      Knee/Hip Exercises: Sidelying   Hip ABduction  Strengthening;Right;1 set;10 reps             PT Education - 01/11/19 1614    Education provided  Yes    Education Details  evaluation findings, POC, goals, HEP with proper form/  rationale.    Person(s) Educated  Patient    Methods  Explanation;Verbal cues;Handout    Comprehension  Verbalized understanding;Verbal cues required       PT Short Term Goals - 01/11/19 1638      PT SHORT TERM GOAL #1   Title  pt to be I with inital HEP    Baseline  no previous HEP    Time  3    Period  Weeks     Status  New    Target Date  02/01/19      PT SHORT TERM GOAL #2   Title  pt to verbalize/ demo techniques to reduce pain and inflammation via RICE and HEP    Baseline  uses medication to help control pain    Time  3    Period  Weeks    Target Date  02/01/19        PT Long Term Goals - 01/11/19 1638      PT LONG TERM GOAL #1   Title  pt to increase R knee extension to -2 degrees to promote functional ROM required for efficient gait pattern    Baseline  inital extension -10 degrees with 5-6/10 pain    Time  6    Period  Weeks    Status  New    Target Date  03/08/19      PT LONG TERM GOAL #2   Title  increase R LE gross strength to >=4+/5 to promote hip/ knee stability with walking/ standing    Baseline  hip abduction 4/5, knee flexion/ extension 3/5    Time  6    Period  Weeks    Status  New    Target Date  03/08/19      PT LONG TERM GOAL #3   Title  pt to be able to walk/ stand for >/= 45 min with no report of pain or limitation for functiona community ambilation/ endurance    Baseline  walking standing 10-15 min max    Time  6    Period  Weeks    Status  New    Target Date  03/08/19      PT LONG TERM GOAL #4   Title  increase LEFS score to >/= 65/80 to demo improvement in function    Baseline  43/80 inital score    Time  6    Period  Weeks    Status  New    Target Date  03/08/19      PT LONG TERM GOAL #5   Title  pt to be I with all HEP given as of last visit to maintain and progress current level of function    Time  6    Period  Weeks    Status  New    Target Date  03/08/19             Plan - 01/11/19 1628    Clinical Impression Statement  pt presents to OPPT s/p R knee arthroscopic medial meniscus debridement on 11/23/2018. Increased time getting into PT secondary to patient rpeorted attempting to strengthen on his own. He has functional flexion and mild limiation with extension, and weakness associated secondary to pain. he would benefit from physical  therapy to decrease R knee pain, improve knee extension and and strength, maximizing overall function by addressing the defictis listed.    Personal Factors and Comorbidities  Comorbidity 1    Comorbidities  Hx of DM  Examination-Activity Limitations  Stand;Locomotion Level    Stability/Clinical Decision Making  Evolving/Moderate complexity    Clinical Decision Making  Moderate    Rehab Potential  Good    PT Frequency  2x / week    PT Duration  6 weeks   inital MCD auth 1 x a week for 3 weeks.   PT Treatment/Interventions  ADLs/Self Care Home Management;Cryotherapy;Electrical Stimulation;Iontophoresis 4mg /ml Dexamethasone;Moist Heat;Ultrasound;Gait training;Therapeutic activities;Therapeutic exercise;Balance training;Neuromuscular re-education;Manual techniques;Dry needling;Taping;Vasopneumatic Device;Stair training;Patient/family education    PT Next Visit Plan  review/ update HEP, hip/ knee strengthening, gait training, stair training, balance,    PT Home Exercise Plan  ACCESS CODE [email or web address] - SLR, sidelying hip abduction, quad set, hamstring stretch, bridges.    Consulted and Agree with Plan of Care  Patient       Patient will benefit from skilled therapeutic intervention in order to improve the following deficits and impairments:  Abnormal gait, Pain, Decreased strength, Increased muscle spasms, Improper body mechanics, Postural dysfunction, Decreased balance, Decreased activity tolerance, Increased edema  Visit Diagnosis: S/P right knee arthroscopy  Chronic pain of right knee  Muscle weakness (generalized)  Other abnormalities of gait and mobility  Localized edema     Problem List Patient Active Problem List   Diagnosis Date Noted  . Acute medial meniscus tear of right knee 11/15/2018  . Chronic pain of right knee 03/22/2018  . 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 Spinal cord mass ([place]) 03/16/2017  . Diabetes mellitus without complication ([place])   . Hypertension     Starr Lake PT, DPT, LAT, ATC  01/11/19  4:57 PM      [facility] [address] [place], [place], [address] Phone: [phone]   Fax:  [phone]  Name: Kyle Novak MRN: [id number] Date of Birth: 26-Feb-1965

## 2019-01-23 ENCOUNTER — Ambulatory Visit: Payer: Medicaid Other | Attending: Physician Assistant | Admitting: Physical Therapy

## 2019-01-23 ENCOUNTER — Telehealth: Payer: Self-pay | Admitting: Physical Therapy

## 2019-01-23 DIAGNOSIS — M6281 Muscle weakness (generalized): Secondary | ICD-10-CM | POA: Insufficient documentation

## 2019-01-23 DIAGNOSIS — R2689 Other abnormalities of gait and mobility: Secondary | ICD-10-CM | POA: Insufficient documentation

## 2019-01-23 DIAGNOSIS — G8929 Other chronic pain: Secondary | ICD-10-CM | POA: Insufficient documentation

## 2019-01-23 DIAGNOSIS — Z9889 Other specified postprocedural states: Secondary | ICD-10-CM | POA: Insufficient documentation

## 2019-01-23 DIAGNOSIS — R600 Localized edema: Secondary | ICD-10-CM | POA: Insufficient documentation

## 2019-01-23 DIAGNOSIS — M25561 Pain in right knee: Secondary | ICD-10-CM | POA: Insufficient documentation

## 2019-01-23 NOTE — Telephone Encounter (Signed)
Spoke to patient regarding missed appointment today. He thought his appointment was set for a later time. He is on the wait list for this week.

## 2019-01-25 ENCOUNTER — Other Ambulatory Visit: Payer: Self-pay

## 2019-01-25 ENCOUNTER — Other Ambulatory Visit (INDEPENDENT_AMBULATORY_CARE_PROVIDER_SITE_OTHER): Payer: Self-pay | Admitting: Primary Care

## 2019-01-25 ENCOUNTER — Encounter: Payer: Self-pay | Admitting: Physical Therapy

## 2019-01-25 ENCOUNTER — Ambulatory Visit: Payer: Medicaid Other | Admitting: Physical Therapy

## 2019-01-25 DIAGNOSIS — Z9889 Other specified postprocedural states: Secondary | ICD-10-CM | POA: Diagnosis present

## 2019-01-25 DIAGNOSIS — R2689 Other abnormalities of gait and mobility: Secondary | ICD-10-CM

## 2019-01-25 DIAGNOSIS — M25561 Pain in right knee: Secondary | ICD-10-CM

## 2019-01-25 DIAGNOSIS — G8929 Other chronic pain: Secondary | ICD-10-CM | POA: Diagnosis present

## 2019-01-25 DIAGNOSIS — M6281 Muscle weakness (generalized): Secondary | ICD-10-CM | POA: Diagnosis present

## 2019-01-25 DIAGNOSIS — R600 Localized edema: Secondary | ICD-10-CM | POA: Diagnosis present

## 2019-01-25 NOTE — Therapy (Signed)
[place], [place], [address] Phone: [phone]   Fax:  [phone]  Physical Therapy Treatment  Patient Details  Name: Kyle Novak MRN: [id number] Date of Birth: 03/11/1965 Referring Provider (PT): Aundra Dubin, [place]   Encounter Date: 01/25/2019  PT End of Session - 01/25/19 1416    Visit Number  2    Number of Visits  13    Date for PT Re-Evaluation  03/08/19    Authorization Type  MCD: resubmit at 4th visit    Authorization Time Period  01/22/2019 - 02/11/2019    Authorization - Visit Number  1    Authorization - Number of Visits  4    PT Start Time  [id number]    PT Stop Time  1457    PT Time Calculation (min)  40 min    Activity Tolerance  Patient tolerated treatment well    Behavior During Therapy  [facility] for tasks assessed/performed       Past Medical History:  Diagnosis Date  . Diabetes mellitus without complication ([place])   . GERD (gastroesophageal reflux disease)   . Hypertension     Past Surgical History:  Procedure Laterality Date  . KNEE ARTHROSCOPY WITH MEDIAL MENISECTOMY Right 11/23/2018   Procedure: RIGHT KNEE ARTHROSCOPY WITH PARTIAL MEDIAL MENISCECTOMY, SYNOVECTOMY;  Surgeon: Leandrew Koyanagi, MD;  Location: [place];  Service: Orthopedics;  Laterality: Right;  . NO PAST SURGERIES    . WISDOM TOOTH EXTRACTION      There were no vitals filed for this visit.  Subjective Assessment - 01/25/19 1421    Subjective  " I've been doing the exercises, I've been working on walking more and have been icing it more"    Patient Stated Goals  want to get leg musles stronger so he doesn't have to wear braces    Currently in Pain?  Yes    Pain Score  2    took medication prior to PT   Pain Onset  More than a month ago    Pain Frequency  Intermittent    Aggravating Factors   prolonged standing/ walking,    Pain Relieving Factors  ice, compression sleeve, walking                        [facility] Adult PT Treatment/Exercise - 01/25/19 0001      Knee/Hip Exercises: Stretches   Active Hamstring Stretch  2 reps;30 seconds;Right    Hip Flexor Stretch  2 reps;30 seconds;Right      Knee/Hip Exercises: Aerobic   Nustep  L3 x 6 min LE only      Knee/Hip Exercises: Seated   Sit to Sand  1 set;15 reps   detering focus utilizing nose over toes rocking     Knee/Hip Exercises: Supine   Quad Sets  1 set;10 reps;Strengthening;Right    Quad Sets Limitations  1 x 10 with balls squeeze to facilitate VMO    Bridges  Strengthening;Both;1 set;15 reps    Straight Leg Raises  1 set;15 reps;Right;Strengthening      Knee/Hip Exercises: Sidelying   Hip ABduction  Strengthening;Right;1 set;15 reps   cues to roll forward     Manual Therapy   Manual therapy comments  MTPR along VMO and distal rectus femoris x 2               PT Short Term Goals - 01/11/19 1638  PT SHORT TERM GOAL #1   Title  pt to be I with inital HEP    Baseline  no previous HEP    Time  3    Period  Weeks    Status  New    Target Date  02/01/19      PT SHORT TERM GOAL #2   Title  pt to verbalize/ demo techniques to reduce pain and inflammation via RICE and HEP    Baseline  uses medication to help control pain    Time  3    Period  Weeks    Target Date  02/01/19        PT Long Term Goals - 01/11/19 1638      PT LONG TERM GOAL #1   Title  pt to increase R knee extension to -2 degrees to promote functional ROM required for efficient gait pattern    Baseline  inital extension -10 degrees with 5-6/10 pain    Time  6    Period  Weeks    Status  New    Target Date  03/08/19      PT LONG TERM GOAL #2   Title  increase R LE gross strength to >=4+/5 to promote hip/ knee stability with walking/ standing    Baseline  hip abduction 4/5, knee flexion/ extension 3/5    Time  6    Period  Weeks    Status  New    Target Date  03/08/19      PT LONG TERM GOAL #3    Title  pt to be able to walk/ stand for >/= 45 min with no report of pain or limitation for functiona community ambilation/ endurance    Baseline  walking standing 10-15 min max    Time  6    Period  Weeks    Status  New    Target Date  03/08/19      PT LONG TERM GOAL #4   Title  increase LEFS score to >/= 65/80 to demo improvement in function    Baseline  43/80 inital score    Time  6    Period  Weeks    Status  New    Target Date  03/08/19      PT LONG TERM GOAL #5   Title  pt to be I with all HEP given as of last visit to maintain and progress current level of function    Time  6    Period  Weeks    Status  New    Target Date  03/08/19            Plan - 01/25/19 1459    Clinical Impression Statement  pt reports consistency with his HEP and has increased walking distance using his [facility] for safety. reviewed HEP and provided intermittent cues on proper form. continued sit to stand strengthening with focus on nose over toes technique, which pt was anticipating pain. during sit to stand focus in changing focus and he reported feeling better and declined modalites at the end of the session.    PT Next Visit Plan  review/ update HEP, hip/ knee strengthening, gait training, stair training, balance, update HEP for sit to stand and begin balance    PT Home Exercise Plan  ACCESS CODE [email or web address] - SLR, sidelying hip abduction, quad set, hamstring stretch, bridges.    Consulted and Agree with Plan of Care  Patient       Patient  will benefit from skilled therapeutic intervention in order to improve the following deficits and impairments:  Abnormal gait, Pain, Decreased strength, Increased muscle spasms, Improper body mechanics, Postural dysfunction, Decreased balance, Decreased activity tolerance, Increased edema  Visit Diagnosis: S/P right knee arthroscopy  Chronic pain of right knee  Muscle weakness (generalized)  Other abnormalities of gait and mobility  Localized  edema     Problem List Patient Active Problem List   Diagnosis Date Noted  . Acute medial meniscus tear of right knee 11/15/2018  . Chronic pain of right knee 03/22/2018  . 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 Spinal cord mass ([place]) 03/16/2017  . Diabetes mellitus without complication ([place])   . Hypertension    Starr Lake PT, DPT, LAT, ATC  01/25/19  3:02 PM      [facility] [address] [place], [place], [address] Phone: [phone]   Fax:  [phone]  Name: Kyle Novak MRN: [id number] Date of Birth: 1965-12-24

## 2019-01-27 ENCOUNTER — Encounter

## 2019-01-30 ENCOUNTER — Encounter: Payer: Self-pay | Admitting: Physical Therapy

## 2019-01-30 ENCOUNTER — Other Ambulatory Visit: Payer: Self-pay

## 2019-01-30 ENCOUNTER — Ambulatory Visit: Payer: Medicaid Other | Admitting: Physical Therapy

## 2019-01-30 DIAGNOSIS — R2689 Other abnormalities of gait and mobility: Secondary | ICD-10-CM

## 2019-01-30 DIAGNOSIS — Z9889 Other specified postprocedural states: Secondary | ICD-10-CM

## 2019-01-30 DIAGNOSIS — G8929 Other chronic pain: Secondary | ICD-10-CM

## 2019-01-30 DIAGNOSIS — M6281 Muscle weakness (generalized): Secondary | ICD-10-CM

## 2019-01-30 DIAGNOSIS — M25561 Pain in right knee: Secondary | ICD-10-CM

## 2019-01-30 DIAGNOSIS — R600 Localized edema: Secondary | ICD-10-CM

## 2019-01-30 NOTE — Therapy (Signed)
[place], [place], [address] Phone: [phone]   Fax:  [phone]  Physical Therapy Treatment  Patient Details  Name: Kyle Novak MRN: [id number] Date of Birth: 11/13/1965 Referring Provider (PT): Aundra Dubin, [place]   Encounter Date: 01/30/2019  PT End of Session - 01/30/19 1722    Visit Number  3    Number of Visits  13    Date for PT Re-Evaluation  03/08/19    Authorization Type  MCD: resubmit at 4th visit    Authorization Time Period  01/22/2019 - 02/11/2019    Authorization - Visit Number  2    Authorization - Number of Visits  3    PT Start Time  1630    PT Stop Time  1713    PT Time Calculation (min)  43 min    Activity Tolerance  Patient tolerated treatment well    Behavior During Therapy  [facility] for tasks assessed/performed       Past Medical History:  Diagnosis Date  . Diabetes mellitus without complication ([place])   . GERD (gastroesophageal reflux disease)   . Hypertension     Past Surgical History:  Procedure Laterality Date  . KNEE ARTHROSCOPY WITH MEDIAL MENISECTOMY Right 11/23/2018   Procedure: RIGHT KNEE ARTHROSCOPY WITH PARTIAL MEDIAL MENISCECTOMY, SYNOVECTOMY;  Surgeon: Leandrew Koyanagi, MD;  Location: [place];  Service: Orthopedics;  Laterality: Right;  . NO PAST SURGERIES    . WISDOM TOOTH EXTRACTION      There were no vitals filed for this visit.  Subjective Assessment - 01/30/19 1637    Subjective  "I am doing pretty good, some soreness but not bad"    Currently in Pain?  Yes    Pain Score  5     Pain Orientation  Right    Pain Descriptors / Indicators  Aching    Pain Type  Chronic pain    Pain Onset  More than a month ago    Pain Frequency  Intermittent         [facility] PT Assessment - 01/30/19 1639      Assessment   Medical Diagnosis  /P right knee arthroscopy Z98.890    Referring Provider (PT)  Aundra Dubin, PA-C      AROM   Right  Knee Extension  0    Right Knee Flexion  125                   [facility] Adult PT Treatment/Exercise - 01/30/19 0001      Knee/Hip Exercises: Aerobic   Nustep  L5 x 5 min LE only      Knee/Hip Exercises: Standing   Heel Raises  1 set;Both;20 reps    Hip Abduction  Stengthening;20 reps;Both;1 set    Hip Extension  Stengthening;1 set;Both;20 reps;Knee straight    Wall Squat  1 set;10 reps    Gait Training  heel strike/ toe off with SPC on L side, 4 x 15 ft      Manual Therapy   Manual Therapy  Joint mobilization;Other (comment)    Joint Mobilization  lateral patellar glide low load long duration    Other Manual Therapy  desensitization along the incision sites             PT Education - 01/30/19 1727    Education provided  Yes    Education Details  updated HEP fo sit to stand  Person(s) Educated  Patient    Methods  Explanation;Verbal cues;Handout    Comprehension  Verbalized understanding;Verbal cues required       PT Short Term Goals - 01/11/19 1638      PT SHORT TERM GOAL #1   Title  pt to be I with inital HEP    Baseline  no previous HEP    Time  3    Period  Weeks    Status  New    Target Date  02/01/19      PT SHORT TERM GOAL #2   Title  pt to verbalize/ demo techniques to reduce pain and inflammation via RICE and HEP    Baseline  uses medication to help control pain    Time  3    Period  Weeks    Target Date  02/01/19        PT Long Term Goals - 01/11/19 1638      PT LONG TERM GOAL #1   Title  pt to increase R knee extension to -2 degrees to promote functional ROM required for efficient gait pattern    Baseline  inital extension -10 degrees with 5-6/10 pain    Time  6    Period  Weeks    Status  New    Target Date  03/08/19      PT LONG TERM GOAL #2   Title  increase R LE gross strength to >=4+/5 to promote hip/ knee stability with walking/ standing    Baseline  hip abduction 4/5, knee flexion/ extension 3/5    Time  6    Period   Weeks    Status  New    Target Date  03/08/19      PT LONG TERM GOAL #3   Title  pt to be able to walk/ stand for >/= 45 min with no report of pain or limitation for functiona community ambilation/ endurance    Baseline  walking standing 10-15 min max    Time  6    Period  Weeks    Status  New    Target Date  03/08/19      PT LONG TERM GOAL #4   Title  increase LEFS score to >/= 65/80 to demo improvement in function    Baseline  43/80 inital score    Time  6    Period  Weeks    Status  New    Target Date  03/08/19      PT LONG TERM GOAL #5   Title  pt to be I with all HEP given as of last visit to maintain and progress current level of function    Time  6    Period  Weeks    Status  New    Target Date  03/08/19            Plan - 01/30/19 1724    Clinical Impression Statement  pt reports pain at 5/10 but reports " its manageable" continued working on patellar mobs with low load long duration, as well as hip / knee strengthening which he did not some increased soreness in the R knee with wall squats but was able to complete all exercises.    PT Treatment/Interventions  ADLs/Self Care Home Management;Cryotherapy;Electrical Stimulation;Iontophoresis 4mg /ml Dexamethasone;Moist Heat;Ultrasound;Gait training;Therapeutic activities;Therapeutic exercise;Balance training;Neuromuscular re-education;Manual techniques;Dry needling;Taping;Vasopneumatic Device;Stair training;Patient/family education    PT Next Visit Plan  MCD ERO -  review/ update HEP, hip/ knee strengthening, gait training, stair training,  balance, update HEP for sit to stand and begin balance    PT Home Exercise Plan  ACCESS CODE [email or web address] - SLR, sidelying hip abduction, quad set, hamstring stretch, bridges.    Consulted and Agree with Plan of Care  Patient       Patient will benefit from skilled therapeutic intervention in order to improve the following deficits and impairments:  Abnormal gait, Pain, Decreased strength,  Increased muscle spasms, Improper body mechanics, Postural dysfunction, Decreased balance, Decreased activity tolerance, Increased edema  Visit Diagnosis: S/P right knee arthroscopy  Chronic pain of right knee  Muscle weakness (generalized)  Other abnormalities of gait and mobility  Localized edema     Problem List Patient Active Problem List   Diagnosis Date Noted  . Acute medial meniscus tear of right knee 11/15/2018  . Chronic pain of right knee 03/22/2018  . 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 Spinal cord mass ([place]) 03/16/2017  . Diabetes mellitus without complication ([place])   . Hypertension    Starr Lake PT, DPT, LAT, ATC  01/30/19  5:28 PM      [facility] [address] [place], [place], [address] Phone: [phone]   Fax:  [phone]  Name: Kyle Novak MRN: [id number] Date of Birth: 1965/10/29

## 2019-02-06 ENCOUNTER — Ambulatory Visit: Payer: Medicaid Other | Admitting: Physical Therapy

## 2019-02-06 ENCOUNTER — Encounter: Payer: Self-pay | Admitting: Physical Therapy

## 2019-02-06 ENCOUNTER — Other Ambulatory Visit: Payer: Self-pay

## 2019-02-06 DIAGNOSIS — Z9889 Other specified postprocedural states: Secondary | ICD-10-CM

## 2019-02-06 DIAGNOSIS — G8929 Other chronic pain: Secondary | ICD-10-CM

## 2019-02-06 DIAGNOSIS — R2689 Other abnormalities of gait and mobility: Secondary | ICD-10-CM

## 2019-02-06 DIAGNOSIS — R600 Localized edema: Secondary | ICD-10-CM

## 2019-02-06 DIAGNOSIS — M6281 Muscle weakness (generalized): Secondary | ICD-10-CM

## 2019-02-06 NOTE — Therapy (Signed)
[place], [place], [address] Phone: [phone]   Fax:  [phone]  Physical Therapy Treatment  Patient Details  Name: Kyle Novak MRN: [id number] Date of Birth: Aug 31, 1965 Referring Provider (PT): Aundra Dubin, [place]   Encounter Date: 02/06/2019  PT End of Session - 02/06/19 1526    Visit Number  4    Number of Visits  13    Date for PT Re-Evaluation  03/08/19    Authorization Type  MCD: resubmitted on 4th visit    Authorization Time Period  01/22/2019 - 02/11/2019    Authorization - Visit Number  3    Authorization - Number of Visits  3    PT Start Time  1501    Activity Tolerance  Patient tolerated treatment well    Behavior During Therapy  [facility] for tasks assessed/performed       Past Medical History:  Diagnosis Date  . Diabetes mellitus without complication ([place])   . GERD (gastroesophageal reflux disease)   . Hypertension     Past Surgical History:  Procedure Laterality Date  . KNEE ARTHROSCOPY WITH MEDIAL MENISECTOMY Right 11/23/2018   Procedure: RIGHT KNEE ARTHROSCOPY WITH PARTIAL MEDIAL MENISCECTOMY, SYNOVECTOMY;  Surgeon: Leandrew Koyanagi, MD;  Location: [place];  Service: Orthopedics;  Laterality: Right;  . NO PAST SURGERIES    . WISDOM TOOTH EXTRACTION      There were no vitals filed for this visit.  Subjective Assessment - 02/06/19 1506    Subjective  " I am doing pretty good, I have been trying to do more at home. i still use ice to calm down soreness"    Pertinent History  spinal tumor diagnosed on 03-15-17; hospitalization 03-15-17 - 03-19-17 with worsening gait and difficulty lifting feet off ground     Patient Stated Goals  want to get leg musles stronger so he doesn't have to wear braces    Currently in Pain?  Yes    Pain Score  0-No pain    Pain Orientation  Right    Pain Descriptors / Indicators  Aching    Pain Type  Chronic pain    Aggravating  Factors   prlonged standing/ walking    Pain Relieving Factors  ice, compression sleeve         [facility] PT Assessment - 02/06/19 0001      Assessment   Medical Diagnosis   right knee arthroscopy Z98.890    Referring Provider (PT)  Aundra Dubin, PA-C      Observation/Other Assessments   Lower Extremity Functional Scale   47/80      AROM   Right Knee Extension  0    Right Knee Flexion  127      Strength   Right Hip Flexion  4+/5    Right Hip ABduction  4/5    Right Knee Flexion  4/5   soreness noted during testing   Right Knee Extension  4/5   soreness noted during testing                  [facility] Adult PT Treatment/Exercise - 02/06/19 0001      Knee/Hip Exercises: Aerobic   Nustep  L5 x 5 min LE only      Knee/Hip Exercises: Machines for Strengthening   Cybex Knee Extension  2 x 10 10#    Cybex Knee Flexion  2 x 10 15# bil LE    Cybex  Leg Press  2 x 12 20# bil LE             PT Education - 02/06/19 1526    Education provided  Yes    Education Details  reviewed previously provided HEP    Person(s) Educated  Patient    Methods  Explanation;Verbal cues;Handout    Comprehension  Verbalized understanding;Verbal cues required       PT Short Term Goals - 02/06/19 1508      PT SHORT TERM GOAL #1   Title  pt to be I with inital HEP    Period  Weeks    Status  Achieved      PT SHORT TERM GOAL #2   Title  pt to verbalize/ demo techniques to reduce pain and inflammation via RICE and HEP    Period  Weeks    Status  Achieved        PT Long Term Goals - 02/06/19 1508      PT LONG TERM GOAL #1   Title  pt to increase R knee extension to -2 degrees to promote functional ROM required for efficient gait pattern    Period  Weeks    Status  Achieved      PT LONG TERM GOAL #2   Title  increase R LE gross strength to >=4+/5 to promote hip/ knee stability with walking/ standing    Baseline  hip abduction 4/5, knee flexion/ extension 4/5    Time  6     Period  Weeks    Status  On-going      PT LONG TERM GOAL #3   Title  pt to be able to walk/ stand for >/= 45 min with no report of pain or limitation for functiona community ambilation/ endurance    Baseline  walking/ standing for 60 min with SPC but does report 2-3/10    Period  Weeks    Status  On-going      PT LONG TERM GOAL #4   Title  increase LEFS score to >/= 65/80 to demo improvement in function    Baseline  47/80    Time  6    Period  Weeks    Status  On-going      PT LONG TERM GOAL #5   Title  pt to be I with all HEP given as of last visit to maintain and progress current level of function    Baseline  independent for current HEP and progress as able.    Period  Weeks    Status  On-going            Plan - 02/06/19 1527    Clinical Impression Statement  Mr Luce is making good progress with physical therapy increased knee ROM and strength. He does continue to reprot pain located along the medial aspect of the R knee limited strength. He met all STG's and is making good progress toward his LTG's and is motivated continued with treatment to improve funciton. he would benefit from continued physical therapy to increased strength, promote functional gait/ stair training and reduce pain and work toward goals/ independent exercise.    Rehab Potential  Good    PT Duration  --   1 x a week for 3 weeks   PT Treatment/Interventions  ADLs/Self Care Home Management;Cryotherapy;Electrical Stimulation;Iontophoresis 43m/ml Dexamethasone;Moist Heat;Ultrasound;Gait training;Therapeutic activities;Therapeutic exercise;Balance training;Neuromuscular re-education;Manual techniques;Dry needling;Taping;Vasopneumatic Device;Stair training;Patient/family education    PT Next Visit Plan  review/ update HEP, hip/ knee  strengthening, gait training, stair training, balance, update HEP for sit to stand and begin balance, how was machine strengthening.    PT Home Exercise Plan  ACCESS CODE  [id number] - SLR, sidelying hip abduction, quad set, hamstring stretch, bridges.    Consulted and Agree with Plan of Care  Patient       Patient will benefit from skilled therapeutic intervention in order to improve the following deficits and impairments:  Abnormal gait, Pain, Decreased strength, Increased muscle spasms, Improper body mechanics, Postural dysfunction, Decreased balance, Decreased activity tolerance, Increased edema  Visit Diagnosis: S/P right knee arthroscopy  Chronic pain of right knee  Muscle weakness (generalized)  Other abnormalities of gait and mobility  Localized edema     Problem List Patient Active Problem List   Diagnosis Date Noted  . Acute medial meniscus tear of right knee 11/15/2018  . Chronic pain of right knee 03/22/2018  . 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 Spinal cord mass ([place]) 03/16/2017  . Diabetes mellitus without complication ([place])   . Hypertension     Starr Lake PT, DPT, LAT, ATC  02/06/19  3:35 PM      [facility] [address] [place], [place], [address] Phone: [phone]   Fax:  [phone]  Name: BOWE SIDOR MRN: [id number] Date of Birth: 1965-02-18

## 2019-02-07 ENCOUNTER — Other Ambulatory Visit: Payer: Self-pay

## 2019-02-07 ENCOUNTER — Emergency Department (HOSPITAL_COMMUNITY): Payer: Medicaid Other

## 2019-02-07 ENCOUNTER — Encounter (HOSPITAL_COMMUNITY): Payer: Self-pay | Admitting: Emergency Medicine

## 2019-02-07 ENCOUNTER — Emergency Department (HOSPITAL_COMMUNITY)
Admission: EM | Admit: 2019-02-07 | Discharge: 2019-02-07 | Discharge status: Against Medical Advice | Payer: Medicaid Other | Attending: Emergency Medicine | Admitting: Emergency Medicine

## 2019-02-07 DIAGNOSIS — Z5321 Procedure and treatment not carried out due to patient leaving prior to being seen by health care provider: Secondary | ICD-10-CM | POA: Insufficient documentation

## 2019-02-07 DIAGNOSIS — R0789 Other chest pain: Secondary | ICD-10-CM | POA: Diagnosis present

## 2019-02-07 LAB — CBC
HCT: 45.7 % (ref 39.0–52.0)
Hemoglobin: 15.1 g/dL (ref 13.0–17.0)
MCH: 28.6 pg (ref 26.0–34.0)
MCHC: 33 g/dL (ref 30.0–36.0)
MCV: 86.6 fL (ref 80.0–100.0)
Platelets: 291 10*3/uL (ref 150–400)
RBC: 5.28 MIL/uL (ref 4.22–5.81)
RDW: 12.2 % (ref 11.5–15.5)
WBC: 9.1 10*3/uL (ref 4.0–10.5)
nRBC: 0 % (ref 0.0–0.2)

## 2019-02-07 LAB — BASIC METABOLIC PANEL
Anion gap: 12 (ref 5–15)
BUN: 14 mg/dL (ref 6–20)
CO2: 25 mmol/L (ref 22–32)
Calcium: 9.8 mg/dL (ref 8.9–10.3)
Chloride: 103 mmol/L (ref 98–111)
Creatinine, Ser: 0.94 mg/dL (ref 0.61–1.24)
GFR calc Af Amer: >60 mL/min (ref >60)
GFR calc non Af Amer: >60 mL/min (ref >60)
Glucose, Bld: 153 mg/dL — ABNORMAL HIGH (ref 70–99)
Potassium: 4.1 mmol/L (ref 3.5–5.1)
Sodium: 140 mmol/L (ref 135–145)

## 2019-02-07 LAB — PROTIME-INR
INR: 1 (ref 0.8–1.2)
Prothrombin Time: 13 seconds (ref 11.4–15.2)

## 2019-02-07 LAB — TROPONIN I (HIGH SENSITIVITY): Troponin I (High Sensitivity): 6 ng/L (ref <18)

## 2019-02-07 MED ORDER — SODIUM CHLORIDE 0.9% FLUSH: 3.0000 mL | Freq: Once | INTRAVENOUS | Status: DC

## 2019-02-07 NOTE — ED Notes (Signed)
Pt sees nearby pt's go back to room. Pt asks them how long they have been here. Pt is upset at the idea of waiting as long as they have. Pt starts walking out lobby entrance. Pt encouraged to stay and wait time explained. Explained risks of leaving with chest pain. Pt is still very upset and refuses to stay. Labels retrieved and seen leaving lobby entrance to parking lot.

## 2019-02-07 NOTE — ED Triage Notes (Signed)
Patient reports central chest pain with SOB, emesis  and dry cough this evening , denies diaphoresis or fever .

## 2019-02-09 ENCOUNTER — Ambulatory Visit (HOSPITAL_COMMUNITY)
Admission: EM | Admit: 2019-02-09 | Discharge: 2019-02-09 | Disposition: A | Discharge status: Home / Self Care | Payer: Medicaid Other | Attending: Urgent Care | Admitting: Urgent Care

## 2019-02-09 ENCOUNTER — Encounter (HOSPITAL_COMMUNITY): Payer: Self-pay | Admitting: Emergency Medicine

## 2019-02-09 ENCOUNTER — Other Ambulatory Visit: Payer: Self-pay

## 2019-02-09 DIAGNOSIS — I10 Essential (primary) hypertension: Secondary | ICD-10-CM | POA: Diagnosis not present

## 2019-02-09 DIAGNOSIS — K219 Gastro-esophageal reflux disease without esophagitis: Secondary | ICD-10-CM | POA: Insufficient documentation

## 2019-02-09 DIAGNOSIS — U071 COVID-19: Secondary | ICD-10-CM | POA: Insufficient documentation

## 2019-02-09 DIAGNOSIS — R0602 Shortness of breath: Secondary | ICD-10-CM | POA: Diagnosis present

## 2019-02-09 DIAGNOSIS — Z79899 Other long term (current) drug therapy: Secondary | ICD-10-CM | POA: Diagnosis not present

## 2019-02-09 DIAGNOSIS — E119 Type 2 diabetes mellitus without complications: Secondary | ICD-10-CM | POA: Insufficient documentation

## 2019-02-09 DIAGNOSIS — B9789 Other viral agents as the cause of diseases classified elsewhere: Secondary | ICD-10-CM | POA: Diagnosis not present

## 2019-02-09 DIAGNOSIS — J069 Acute upper respiratory infection, unspecified: Secondary | ICD-10-CM | POA: Insufficient documentation

## 2019-02-09 DIAGNOSIS — Z7984 Long term (current) use of oral hypoglycemic drugs: Secondary | ICD-10-CM | POA: Diagnosis not present

## 2019-02-09 DIAGNOSIS — R05 Cough: Secondary | ICD-10-CM

## 2019-02-09 DIAGNOSIS — R0789 Other chest pain: Secondary | ICD-10-CM

## 2019-02-09 MED ORDER — BENZONATATE 100 MG PO CAPS: 100.0000 mg | ORAL_CAPSULE | Freq: Three times a day (TID) | ORAL | 0 refills | Status: DC | PRN

## 2019-02-09 MED ORDER — ALBUTEROL SULFATE HFA 108 (90 BASE) MCG/ACT IN AERS: 1.0000 | INHALATION_SPRAY | Freq: Four times a day (QID) | RESPIRATORY_TRACT | 0 refills | Status: DC | PRN

## 2019-02-09 MED ORDER — PROMETHAZINE-DM 6.25-15 MG/5ML PO SYRP: 5.0000 mL | ORAL_SOLUTION | Freq: Every evening | ORAL | 0 refills | Status: DC | PRN

## 2019-02-09 NOTE — ED Triage Notes (Signed)
Pt here for cold sx onset 4 days associated w/chest pain, nauseas, fatigue, chills, cough, SOB/dyspnea  Seen at [facility] but left d/t wait on 12/22  A&O x4... NAD.Marland Kitchen. ambulatory

## 2019-02-09 NOTE — Discharge Instructions (Signed)
We will manage this as a viral syndrome. For sore throat or cough try using a honey-based tea. Use 3 teaspoons of honey with juice squeezed from half lemon. Place shaved pieces of ginger into 1/2-1 cup of water and warm over stove top. Then mix the ingredients and repeat every 4 hours as needed. Please take Tylenol 500mg -650mg  every 6 hours. Hydrate very well with at least 2 liters of water. Eat light meals such as soups to replenish electrolytes and soft fruits, veggies. Start an antihistamine like Zyrtec (cetirizine) at 10mg  daily for postnasal drainage, sinus congestion.  You can take this together with pseudoephedrine (Sudafed) at a dose of 30 mg 3 times a day or twice daily as needed for the same kind of congestion.

## 2019-02-09 NOTE — ED Provider Notes (Signed)
[place]   MRN: [id number] DOB: Jul 14, 1965  Subjective:   Kyle Novak is a 53 y.o. male presenting for 4-day history of persistent moderate to severe malaise.  Patient has had significant difficulty with a dry hacking cough that elicits shortness of breath and chest pain.  He went to the emergency room on 02/07/2019 and had a panel of labs drawn.  Troponin, CBC, PT/INR were normal.  He had slightly elevated blood sugar but overall has really good control of his diabetes.  Patient is 1/4-1/3ppd smoker.  No current facility-administered medications for this encounter.  Current Outpatient Medications:  .  amLODipine (NORVASC) 10 MG tablet, Take 1 tablet (10 mg total) by mouth daily., Disp: 30 tablet, Rfl: 5 .  diclofenac Sodium (VOLTAREN) 1 % GEL, Apply 2 g topically 4 (four) times daily., Disp: 150 g, Rfl: 1 .  DULoxetine (CYMBALTA) 60 MG capsule, Take 1 capsule (60 mg total) by mouth daily., Disp: 30 capsule, Rfl: 5 .  gabapentin (NEURONTIN) 600 MG tablet, Take 2 tablets (1,200 mg total) by mouth 3 (three) times daily., Disp: 180 tablet, Rfl: 5 .  glipiZIDE (GLUCOTROL) 10 MG tablet, TAKE 1 TABLET (10 MG TOTAL) BY MOUTH 2 (TWO) TIMES DAILY BEFORE A MEAL., Disp: 60 tablet, Rfl: 3 .  HYDROcodone-acetaminophen (NORCO) 5-325 MG tablet, Take 1-2 tablets by mouth daily as needed for moderate pain., Disp: 20 tablet, Rfl: 0 .  losartan (COZAAR) 50 MG tablet, Take 1 tablet (50 mg total) by mouth daily., Disp: 30 tablet, Rfl: 5 .  metFORMIN (GLUCOPHAGE) 1000 MG tablet, Take 1 tablet (1,000 mg total) by mouth 2 (two) times daily with a meal., Disp: 180 tablet, Rfl: 3 .  Multiple Vitamin (MULITIVITAMIN WITH MINERALS) TABS, Take 1 tablet by mouth daily., Disp: , Rfl:  .  omeprazole (PRILOSEC) 20 MG capsule, TAKE 1 CAPSULE (20 MG TOTAL) BY MOUTH DAILY., Disp: 30 capsule, Rfl: 3 .  traMADol (ULTRAM) 50 MG tablet, Take 50 mg by mouth as needed., Disp: , Rfl:   Facility-Administered  Medications Ordered in Other Encounters:  .  acetaminophen (TYLENOL) tablet 650 mg, 650 mg, Oral, Q4H PRN, Lorriane Shire, MD   No Known Allergies  Past Medical History:  Diagnosis Date  . Diabetes mellitus without complication ([place])   . GERD (gastroesophageal reflux disease)   . Hypertension      Past Surgical History:  Procedure Laterality Date  . KNEE ARTHROSCOPY WITH MEDIAL MENISECTOMY Right 11/23/2018   Procedure: RIGHT KNEE ARTHROSCOPY WITH PARTIAL MEDIAL MENISCECTOMY, SYNOVECTOMY;  Surgeon: Leandrew Koyanagi, MD;  Location: [place];  Service: Orthopedics;  Laterality: Right;  . NO PAST SURGERIES    . WISDOM TOOTH EXTRACTION      Family History  Problem Relation Age of Onset  . Lung cancer Father   . Heart failure Brother     Social History   Tobacco Use  . Smoking status: Current Every Day Smoker    Packs/day: 0.50    Types: Cigarettes  . Smokeless tobacco: Never Used  Substance Use Topics  . Alcohol use: Yes    Comment: OCCASIONAL  . Drug use: No    Review of Systems  Constitutional: Positive for malaise/fatigue. Negative for fever.  HENT: Positive for congestion. Negative for ear pain, sinus pain and sore throat.   Eyes: Negative for discharge and redness.  Respiratory: Positive for cough and shortness of breath. Negative for hemoptysis and wheezing.   Cardiovascular: Positive for chest pain.  Gastrointestinal:  Negative for abdominal pain, diarrhea, nausea and vomiting.  Genitourinary: Negative for dysuria, flank pain and hematuria.  Musculoskeletal: Negative for myalgias.  Skin: Negative for rash.  Neurological: Positive for headaches. Negative for dizziness and weakness.  Psychiatric/Behavioral: Negative for depression and substance abuse.     Objective:   Vitals: BP (!) 135/92 (BP Location: Right Arm)   Pulse 97   Temp 98.4 F (36.9 C) (Oral)   Resp 16   SpO2 95%   Physical Exam Constitutional:      General: He is not in acute  distress.    Appearance: Normal appearance. He is well-developed and normal weight. He is not ill-appearing, toxic-appearing or diaphoretic.  HENT:     Head: Normocephalic and atraumatic.     Right Ear: Tympanic membrane, ear canal and external ear normal. There is no impacted cerumen.     Left Ear: Tympanic membrane, ear canal and external ear normal. There is no impacted cerumen.     Nose: Nose normal. No congestion or rhinorrhea.     Mouth/Throat:     Mouth: Mucous membranes are moist.     Pharynx: Oropharynx is clear. No oropharyngeal exudate or posterior oropharyngeal erythema.  Eyes:     General: No scleral icterus.       Right eye: No discharge.        Left eye: No discharge.     Extraocular Movements: Extraocular movements intact.     Conjunctiva/sclera: Conjunctivae normal.     Pupils: Pupils are equal, round, and reactive to light.  Cardiovascular:     Rate and Rhythm: Normal rate and regular rhythm.     Heart sounds: Normal heart sounds. No murmur. No friction rub. No gallop.   Pulmonary:     Effort: Pulmonary effort is normal. No respiratory distress.     Breath sounds: Normal breath sounds. No stridor. No wheezing, rhonchi or rales.  Musculoskeletal:     Cervical back: Normal range of motion and neck supple. No rigidity. No muscular tenderness.  Neurological:     General: No focal deficit present.     Mental Status: He is alert and oriented to person, place, and time.  Psychiatric:        Mood and Affect: Mood normal.        Behavior: Behavior normal.        Thought Content: Thought content normal.     Assessment and Plan :   1. Viral URI with cough   2. Shortness of breath   3. Atypical chest pain   4. Well controlled diabetes mellitus ([place])   5. Essential hypertension     Patient is higher risk due to DM, HTN, smoking. Will manage for viral illness such as viral URI, viral rhinitis, possible COVID-19. Counseled patient on nature of COVID-19 including modes  of transmission, diagnostic testing, management and supportive care.  Offered symptomatic relief including albuterol inhaler, hold off on smoking as much as possible. COVID 19 testing is pending. Counseled patient on potential for adverse effects with medications prescribed/recommended today, ER and return-to-clinic precautions discussed, patient verbalized understanding.     Jaynee Eagles, PA-C 02/09/19 1553

## 2019-02-10 LAB — NOVEL CORONAVIRUS, NAA (HOSP ORDER, SEND-OUT TO REF LAB; TAT 18-24 HRS): SARS-CoV-2, NAA: DETECTED — AB

## 2019-02-13 ENCOUNTER — Telehealth: Payer: Self-pay | Admitting: Emergency Medicine

## 2019-02-13 NOTE — Telephone Encounter (Signed)
Your test for COVID-19 was positive, meaning that you were infected with the novel coronavirus and could give the germ to others.  Please continue isolation at home for at least 10 days since the start of your symptoms. If you do not have symptoms, please isolate at home for 10 days from the day you were tested. Once you complete your 10 day quarantine, you may return to normal activities as long as you've not had a fever for over 24 hours(without taking fever reducing medicine) and your symptoms are improving. Please continue good preventive care measures, including:  frequent hand-washing, avoid touching your face, cover coughs/sneezes, stay out of crowds and keep a 6 foot distance from others.  Go to the nearest hospital emergency room if fever/cough/breathlessness are severe or illness seems like a threat to life.  Attempted to reach patient. No answer at this time. Voicemail left.

## 2019-02-14 ENCOUNTER — Encounter (HOSPITAL_COMMUNITY): Payer: Self-pay

## 2019-02-14 ENCOUNTER — Emergency Department (HOSPITAL_COMMUNITY)
Admission: EM | Admit: 2019-02-14 | Discharge: 2019-02-14 | Disposition: A | Discharge status: Home / Self Care | Payer: Medicaid Other | Attending: Emergency Medicine | Admitting: Emergency Medicine

## 2019-02-14 ENCOUNTER — Emergency Department (HOSPITAL_COMMUNITY): Payer: Medicaid Other

## 2019-02-14 DIAGNOSIS — Z5321 Procedure and treatment not carried out due to patient leaving prior to being seen by health care provider: Secondary | ICD-10-CM | POA: Insufficient documentation

## 2019-02-14 DIAGNOSIS — U071 COVID-19: Secondary | ICD-10-CM | POA: Diagnosis present

## 2019-02-14 DIAGNOSIS — M7918 Myalgia, other site: Secondary | ICD-10-CM | POA: Insufficient documentation

## 2019-02-14 NOTE — ED Notes (Signed)
Patient ambulatory to nurse's station without difficulty. Patient complaining of wait time. Made patient aware provider would soon be in to see patient.

## 2019-02-14 NOTE — ED Triage Notes (Signed)
Pt arrives today stating that he was recently dx with COVID+. Pt states that he is feeling worse than when dx. Pt states that he has generalized body aches. Pt states that he last used his inhaler at 1500 and robitussin DM last night.

## 2019-02-14 NOTE — ED Notes (Signed)
Pt walked out passed nursing station AMA and stated "i'm going to call [facility] because I have a story for them about this"

## 2019-02-21 ENCOUNTER — Ambulatory Visit: Payer: Medicaid Other | Admitting: Orthopaedic Surgery

## 2019-02-28 ENCOUNTER — Other Ambulatory Visit: Payer: Self-pay

## 2019-02-28 ENCOUNTER — Ambulatory Visit: Payer: Medicaid Other | Attending: Primary Care | Admitting: Physical Therapy

## 2019-02-28 DIAGNOSIS — M25561 Pain in right knee: Secondary | ICD-10-CM | POA: Diagnosis present

## 2019-02-28 DIAGNOSIS — M6281 Muscle weakness (generalized): Secondary | ICD-10-CM | POA: Insufficient documentation

## 2019-02-28 DIAGNOSIS — G8929 Other chronic pain: Secondary | ICD-10-CM | POA: Insufficient documentation

## 2019-02-28 DIAGNOSIS — Z9889 Other specified postprocedural states: Secondary | ICD-10-CM | POA: Insufficient documentation

## 2019-02-28 NOTE — Therapy (Addendum)
[place], [place], [address] Phone: [phone]   Fax:  [phone]  Physical Therapy Treatment / Discharge  Patient Details  Name: Kyle Novak MRN: [id number] Date of Birth: March 17, 1965 Referring Provider (PT): Kyle Novak, [place]   Encounter Date: 02/28/2019  PT End of Session - 02/28/19 1547    Visit Number  5    Number of Visits  13    Date for PT Re-Evaluation  03/08/19    Authorization Type  MCD: resubmitted on 4th visit    Authorization Time Period  02/27/2019 - 03/19/2019    Authorization - Visit Number  1    Authorization - Number of Visits  3    PT Start Time  1546    PT Stop Time  1628    PT Time Calculation (min)  42 min    Activity Tolerance  Patient tolerated treatment well    Behavior During Therapy  [facility] for tasks assessed/performed       Past Medical History:  Diagnosis Date  . Diabetes mellitus without complication ([place])   . GERD (gastroesophageal reflux disease)   . Hypertension     Past Surgical History:  Procedure Laterality Date  . KNEE ARTHROSCOPY WITH MEDIAL MENISECTOMY Right 11/23/2018   Procedure: RIGHT KNEE ARTHROSCOPY WITH PARTIAL MEDIAL MENISCECTOMY, SYNOVECTOMY;  Surgeon: Kyle Koyanagi, MD;  Location: [facility];  Service: Orthopedics;  Laterality: Right;  . NO PAST SURGERIES    . WISDOM TOOTH EXTRACTION      There were no vitals filed for this visit.  Subjective Assessment - 02/28/19 1547    Subjective  "I quarantined due to being tested positive with COVID, but have been released from quarantine last week. i am doing pretty good, no pain just some soreness off and on"    Patient Stated Goals  want to get leg musles stronger so he doesn't have to wear braces    Currently in Pain?  No/denies    Pain Score  0-No pain    Pain Orientation  Right    Pain Type  Chronic pain         [facility] PT Assessment - 02/28/19 0001      Assessment   Medical Diagnosis   right knee arthroscopy Z98.890    Referring Provider (PT)  Kyle Novak                   [facility] Adult PT Treatment/Exercise - 02/28/19 1552      Knee/Hip Exercises: Stretches   Passive Hamstring Stretch  2 reps;Right;30 seconds   with strap     Knee/Hip Exercises: Aerobic   Nustep  L6 x 5 min  LE only      Knee/Hip Exercises: Machines for Strengthening   Cybex Knee Extension  2 x 15 10# bil LE    Cybex Knee Flexion  2 x 12 15# bil LE    Cybex Leg Press  --      Knee/Hip Exercises: Standing   Forward Step Up  Right;2 sets;15 reps;Step Height: 6"    Gait Training  heel / toe pattern using trekking poles 4 x 60 ft    demonstration for proper form     Knee/Hip Exercises: Seated   Sit to Sand  1 set;15 reps;without UE support          Balance Exercises - 02/28/19 1634      Balance Exercises: Standing  Standing Eyes Opened  Narrow base of support (BOS);2 reps;30 secs    Standing Eyes Closed  Narrow base of support (BOS);2 reps;30 secs    Tandem Stance  Eyes open;4 reps;30 secs   alternating        PT Education - 02/28/19 1630    Education provided  Yes    Education Details  updated for corner balance    Person(s) Educated  Patient    Methods  Explanation;Verbal cues    Comprehension  Verbalized understanding;Verbal cues required       PT Short Term Goals - 02/06/19 1508      PT SHORT TERM GOAL #1   Title  pt to be I with inital HEP    Period  Weeks    Status  Achieved      PT SHORT TERM GOAL #2   Title  pt to verbalize/ demo techniques to reduce pain and inflammation via RICE and HEP    Period  Weeks    Status  Achieved        PT Long Term Goals - 02/06/19 1508      PT LONG TERM GOAL #1   Title  pt to increase R knee extension to -2 degrees to promote functional ROM required for efficient gait pattern    Period  Weeks    Status  Achieved      PT LONG TERM GOAL #2   Title  increase R LE gross strength to  >=4+/5 to promote hip/ knee stability with walking/ standing    Baseline  hip abduction 4/5, knee flexion/ extension 4/5    Time  6    Period  Weeks    Status  On-going      PT LONG TERM GOAL #3   Title  pt to be able to walk/ stand for >/= 45 min with no report of pain or limitation for functiona community ambilation/ endurance    Baseline  walking/ standing for 60 min with SPC but does report 2-3/10    Period  Weeks    Status  On-going      PT LONG TERM GOAL #4   Title  increase LEFS score to >/= 65/80 to demo improvement in function    Baseline  47/80    Time  6    Period  Weeks    Status  On-going      PT LONG TERM GOAL #5   Title  pt to be I with all HEP given as of last visit to maintain and progress current level of function    Baseline  independent for current HEP and progress as able.    Period  Weeks    Status  On-going            Plan - 02/28/19 1631    Clinical Impression Statement  Kyle Novak returns to PT reporting he was just released from being quarantined for COVID. cotninued working hip/ knee strenghtening which he does note some soreness that resolved with rest. practiced gait training with trekking poles which significantly improved his gait pattern with little to no limp. pt noted no pain end of session.    PT Treatment/Interventions  ADLs/Self Care Home Management;Cryotherapy;Electrical Stimulation;Iontophoresis 53m/ml Dexamethasone;Moist Heat;Ultrasound;Gait training;Therapeutic activities;Therapeutic exercise;Balance training;Neuromuscular re-education;Manual techniques;Dry needling;Taping;Vasopneumatic Device;Stair training;Patient/family education    PT Next Visit Plan  review/ update HEP, hip/ knee strengthening, gait training, stair training, balance, update HEP for sit to stand and begin balance, how was machine strengthening.  PT Home Exercise Plan  ACCESS CODE [id number] - SLR, sidelying hip abduction, quad set, hamstring stretch, bridges, corner  balance    Consulted and Agree with Plan of Care  Patient       Patient will benefit from skilled therapeutic intervention in order to improve the following deficits and impairments:  Abnormal gait, Pain, Decreased strength, Increased muscle spasms, Improper body mechanics, Postural dysfunction, Decreased balance, Decreased activity tolerance, Increased edema  Visit Diagnosis: S/P right knee arthroscopy  Chronic pain of right knee  Muscle weakness (generalized)     Problem List Patient Active Problem List   Diagnosis Date Noted  . Acute medial meniscus tear of right knee 11/15/2018  . Chronic pain of right knee 03/22/2018  . 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 Spinal cord mass ([place]) 03/16/2017  . Diabetes mellitus without complication ([place])   . Hypertension    Starr Lake PT, DPT, LAT, ATC  02/28/19  4:38 PM      [facility] [address] [place], [place], [address] Phone: [phone]   Fax:  [phone]  Name: TYRA MICHELLE MRN: [id number] Date of Birth: 08/11/1965        PHYSICAL THERAPY DISCHARGE SUMMARY  Visits from Start of Care: 5  Current functional level related to goals / functional outcomes: See goals   Remaining deficits: Current status unknown   Education / Equipment: HEP, theraband, posture, lifting mechanics  Plan: Patient agrees to discharge.  Patient goals were partially met. Patient is being discharged due to not returning since the last visit.  ?????         Lenardo Westwood PT, DPT, LAT, ATC  03/27/19  1:01 PM

## 2019-03-07 ENCOUNTER — Telehealth: Payer: Self-pay | Admitting: Physical Therapy

## 2019-03-07 ENCOUNTER — Ambulatory Visit: Payer: Medicaid Other | Admitting: Physical Therapy

## 2019-03-07 NOTE — Telephone Encounter (Signed)
Spoke with pt about missed appointment today which he stated he has had a lot going on and completely forgot about it. I told him when his next scheduled appointment is and if he can't make to call and we can cancel or reschedule that appointment for him.   Assia Meanor PT, DPT, LAT, ATC  03/07/19  4:27 PM

## 2019-03-10 ENCOUNTER — Other Ambulatory Visit (INDEPENDENT_AMBULATORY_CARE_PROVIDER_SITE_OTHER): Payer: Self-pay | Admitting: Primary Care

## 2019-03-10 DIAGNOSIS — G4700 Insomnia, unspecified: Secondary | ICD-10-CM

## 2019-03-10 DIAGNOSIS — E1142 Type 2 diabetes mellitus with diabetic polyneuropathy: Secondary | ICD-10-CM

## 2019-03-14 ENCOUNTER — Ambulatory Visit: Payer: Medicaid Other | Admitting: Physical Therapy

## 2019-03-28 ENCOUNTER — Other Ambulatory Visit (INDEPENDENT_AMBULATORY_CARE_PROVIDER_SITE_OTHER): Payer: Self-pay | Admitting: Primary Care

## 2019-03-28 DIAGNOSIS — I10 Essential (primary) hypertension: Secondary | ICD-10-CM

## 2019-03-28 DIAGNOSIS — E1142 Type 2 diabetes mellitus with diabetic polyneuropathy: Secondary | ICD-10-CM

## 2019-03-28 NOTE — Telephone Encounter (Signed)
Sent to PCP ?

## 2019-03-30 ENCOUNTER — Ambulatory Visit (INDEPENDENT_AMBULATORY_CARE_PROVIDER_SITE_OTHER): Payer: Medicaid Other | Admitting: Primary Care

## 2019-03-30 ENCOUNTER — Other Ambulatory Visit: Payer: Self-pay

## 2019-03-30 ENCOUNTER — Encounter (INDEPENDENT_AMBULATORY_CARE_PROVIDER_SITE_OTHER): Payer: Self-pay | Admitting: Primary Care

## 2019-03-30 VITALS — BP 133/94 | HR 95 | Temp 96.6°F | Ht 69.0 in | Wt 184.0 lb

## 2019-03-30 DIAGNOSIS — E119 Type 2 diabetes mellitus without complications: Secondary | ICD-10-CM

## 2019-03-30 DIAGNOSIS — G4700 Insomnia, unspecified: Secondary | ICD-10-CM

## 2019-03-30 DIAGNOSIS — F17209 Nicotine dependence, unspecified, with unspecified nicotine-induced disorders: Secondary | ICD-10-CM

## 2019-03-30 DIAGNOSIS — M25561 Pain in right knee: Secondary | ICD-10-CM | POA: Diagnosis not present

## 2019-03-30 DIAGNOSIS — E1142 Type 2 diabetes mellitus with diabetic polyneuropathy: Secondary | ICD-10-CM | POA: Diagnosis not present

## 2019-03-30 DIAGNOSIS — G8929 Other chronic pain: Secondary | ICD-10-CM

## 2019-03-30 DIAGNOSIS — I10 Essential (primary) hypertension: Secondary | ICD-10-CM

## 2019-03-30 LAB — GLUCOSE, POCT (MANUAL RESULT ENTRY): POC Glucose: 265 mg/dl — AB (ref 70–99)

## 2019-03-30 LAB — POCT GLYCOSYLATED HEMOGLOBIN (HGB A1C): Hemoglobin A1C: 8.5 % — AB (ref 4.0–5.6)

## 2019-03-30 MED ORDER — OMEPRAZOLE 20 MG PO CPDR: 20.0000 mg | DELAYED_RELEASE_CAPSULE | Freq: Every day | ORAL | 1 refills | Status: DC

## 2019-03-30 MED ORDER — DULOXETINE HCL 60 MG PO CPEP: 60.0000 mg | ORAL_CAPSULE | Freq: Every day | ORAL | 1 refills | Status: DC

## 2019-03-30 MED ORDER — GABAPENTIN 600 MG PO TABS: 1200.0000 mg | ORAL_TABLET | Freq: Three times a day (TID) | ORAL | 1 refills | Status: DC

## 2019-03-30 MED ORDER — METFORMIN HCL 500 MG PO TABS: 500.0000 mg | ORAL_TABLET | Freq: Every day | ORAL | 1 refills | Status: DC

## 2019-03-30 MED ORDER — LOSARTAN POTASSIUM 100 MG PO TABS: 50.0000 mg | ORAL_TABLET | Freq: Every day | ORAL | 1 refills | Status: DC

## 2019-03-30 MED ORDER — AMLODIPINE BESYLATE 10 MG PO TABS: 10.0000 mg | ORAL_TABLET | Freq: Every day | ORAL | 1 refills | Status: DC

## 2019-03-30 MED ORDER — GLIPIZIDE 10 MG PO TABS: 10.0000 mg | ORAL_TABLET | Freq: Two times a day (BID) | ORAL | 1 refills | Status: DC

## 2019-03-30 NOTE — Progress Notes (Signed)
Established Patient Office Visit  Subjective:  Patient ID: Kyle Novak, male    DOB: August 05, 1965  Age: 54 y.o. MRN: [id number]  CC:  Chief Complaint  Patient presents with  . Diabetes    HPI [name] presents for the management of diabetes . He admits to only taking 1 metformin a day due to GI upset. Other medications taken as prescribed. Bp is  elevate but denies  shortness of breath, headaches, chest pain or lower extremity edema. He voices no other concerns or complaints   Past Medical History:  Diagnosis Date  . Diabetes mellitus without complication ([place])   . GERD (gastroesophageal reflux disease)   . Hypertension     Past Surgical History:  Procedure Laterality Date  . KNEE ARTHROSCOPY WITH MEDIAL MENISECTOMY Right 11/23/2018   Procedure: RIGHT KNEE ARTHROSCOPY WITH PARTIAL MEDIAL MENISCECTOMY, SYNOVECTOMY;  Surgeon: Leandrew Koyanagi, MD;  Location: [facility];  Service: Orthopedics;  Laterality: Right;  . NO PAST SURGERIES    . WISDOM TOOTH EXTRACTION      Family History  Problem Relation Age of Onset  . Lung cancer Father   . Heart failure Brother     Social History   Socioeconomic History  . Marital status: Single    Spouse name: Not on file  . Number of children: Not on file  . Years of education: Not on file  . Highest education level: Not on file  Occupational History  . Not on file  Tobacco Use  . Smoking status: Current Every Day Smoker    Packs/day: 0.50    Types: Cigarettes  . Smokeless tobacco: Never Used  Substance and Sexual Activity  . Alcohol use: Yes    Comment: OCCASIONAL  . Drug use: No  . Sexual activity: Not on file  Other Topics Concern  . Not on file  Social History Narrative  . Not on file   Social Determinants of Health   Financial Resource Strain:   . Difficulty of Paying Living Expenses: Not on file  Food Insecurity:   . Worried About Charity fundraiser in the Last Year: Not on file  . Ran  Out of Food in the Last Year: Not on file  Transportation Needs:   . Lack of Transportation (Medical): Not on file  . Lack of Transportation (Non-Medical): Not on file  Physical Activity:   . Days of Exercise per Week: Not on file  . Minutes of Exercise per Session: Not on file  Stress:   . Feeling of Stress : Not on file  Social Connections:   . Frequency of Communication with Friends and Family: Not on file  . Frequency of Social Gatherings with Friends and Family: Not on file  . Attends Religious Services: Not on file  . Active Member of Clubs or Organizations: Not on file  . Attends Archivist Meetings: Not on file  . Marital Status: Not on file  Intimate Partner Violence:   . Fear of Current or Ex-Partner: Not on file  . Emotionally Abused: Not on file  . Physically Abused: Not on file  . Sexually Abused: Not on file    Outpatient Medications Prior to Visit  Medication Sig Dispense Refill  . albuterol (VENTOLIN HFA) 108 (90 Base) MCG/ACT inhaler Inhale 1-2 puffs into the lungs every 6 (six) hours as needed for wheezing or shortness of breath. 18 g 0  . benzonatate (TESSALON) 100 MG capsule Take 1-2 capsules (100-200  mg total) by mouth 3 (three) times daily as needed. 60 capsule 0  . diclofenac Sodium (VOLTAREN) 1 % GEL Apply 2 g topically 4 (four) times daily. 150 g 1  . HYDROcodone-acetaminophen (NORCO) 5-325 MG tablet Take 1-2 tablets by mouth daily as needed for moderate pain. 20 tablet 0  . Multiple Vitamin (MULITIVITAMIN WITH MINERALS) TABS Take 1 tablet by mouth daily.    . promethazine-dextromethorphan (PROMETHAZINE-DM) 6.25-15 MG/5ML syrup Take 5 mLs by mouth at bedtime as needed for cough. 100 mL 0  . traMADol (ULTRAM) 50 MG tablet Take 50 mg by mouth as needed.    Marland Kitchen amLODipine (NORVASC) 10 MG tablet TAKE 1 TABLET (10 MG TOTAL) BY MOUTH DAILY. 30 tablet 0  . DULoxetine (CYMBALTA) 60 MG capsule TAKE 1 CAPSULE (60 MG TOTAL) BY MOUTH DAILY. 30 capsule 2  .  gabapentin (NEURONTIN) 600 MG tablet Take 2 tablets (1,200 mg total) by mouth 3 (three) times daily. 180 tablet 5  . glipiZIDE (GLUCOTROL) 10 MG tablet TAKE 1 TABLET (10 MG TOTAL) BY MOUTH 2 (TWO) TIMES DAILY BEFORE A MEAL. 60 tablet 3  . losartan (COZAAR) 50 MG tablet TAKE 1 TABLET (50 MG TOTAL) BY MOUTH DAILY. 30 tablet 0  . metFORMIN (GLUCOPHAGE) 1000 MG tablet Take 1 tablet (1,000 mg total) by mouth 2 (two) times daily with a meal. 180 tablet 3  . omeprazole (PRILOSEC) 20 MG capsule TAKE 1 CAPSULE (20 MG TOTAL) BY MOUTH DAILY. 30 capsule 3   Facility-Administered Medications Prior to Visit  Medication Dose Route Frequency Provider Last Rate Last Admin  . acetaminophen (TYLENOL) tablet 650 mg  650 mg Oral Q4H PRN Lorriane Shire, MD        No Known Allergies  ROS Review of Systems  All other systems reviewed and are negative.     Objective:    Physical Exam  Constitutional: He is oriented to person, place, and time. He appears well-developed and well-nourished.  HENT:  Head: Normocephalic.  Eyes: Pupils are equal, round, and reactive to light. EOM are normal.  Cardiovascular: Normal rate and regular rhythm.  Abdominal: Bowel sounds are normal.  Musculoskeletal:        General: Normal range of motion.  Neurological: He is oriented to person, place, and time.  Skin: Skin is warm and dry.  Psychiatric: He has a normal mood and affect. His behavior is normal. Judgment and thought content normal.    BP (!) 133/94 (BP Location: Left Arm, Patient Position: Sitting, Cuff Size: Normal)   Pulse 95   Temp (!) 96.6 F (35.9 C) (Temporal)   Ht 5\' 9"  (1.753 m)   Wt 184 lb (83.5 kg)   SpO2 99%   BMI 27.17 kg/m  Wt Readings from Last 3 Encounters:  03/30/19 184 lb (83.5 kg)  12/27/18 185 lb 12.8 oz (84.3 kg)  11/23/18 185 lb 13.6 oz (84.3 kg)     Health Maintenance Due  Topic Date Due  . OPHTHALMOLOGY EXAM  12/31/1975    There are no preventive care reminders to display for  this patient.  No results found for: TSH Lab Results  Component Value Date   WBC 9.1 02/07/2019   HGB 15.1 02/07/2019   HCT 45.7 02/07/2019   MCV 86.6 02/07/2019   PLT 291 02/07/2019   Lab Results  Component Value Date   NA 140 02/07/2019   K 4.1 02/07/2019   CO2 25 02/07/2019   GLUCOSE 153 (H) 02/07/2019   BUN 14 02/07/2019  CREATININE 0.94 02/07/2019   BILITOT 0.3 09/22/2018   ALKPHOS 134 (H) 09/22/2018   AST 18 09/22/2018   ALT 30 09/22/2018   PROT 6.9 09/22/2018   ALBUMIN 4.3 09/22/2018   CALCIUM 9.8 02/07/2019   ANIONGAP 12 02/07/2019   Lab Results  Component Value Date   CHOL 142 09/22/2018   Lab Results  Component Value Date   HDL 43 09/22/2018   Lab Results  Component Value Date   LDLCALC 82 09/22/2018   Lab Results  Component Value Date   TRIG 86 09/22/2018   Lab Results  Component Value Date   CHOLHDL 3.3 09/22/2018   Lab Results  Component Value Date   HGBA1C 8.5 (A) 03/30/2019      Assessment & Plan:  Janes was seen today for diabetes.  Diagnoses and all orders for this visit:  Diabetes mellitus without complication ([place]) Uncontrolled 3 months ago A1c 7.0 increasing, goal  </= 6.5 hemoglobin A1c. Reduce foods that are high in carbohydrates are the following rice, potatoes, breads, sugars, and pastas.  Reduction in the intake (eating) will assist in lowering your blood sugars. Patient had decrease metformin to once daily due to GI upset. Continue to take meformin 500mg  daily but add glucotrol 10mg  bid. If A1c continues to increased we discussed out next option was to add insulin -     HgB A1c 8.5 -     Glucose (CBG)  Chronic pain of right knee -     gabapentin (NEURONTIN) 600 MG tablet; Take 2 tablets (1,200 mg total) by mouth 3 (three) times daily.  Hypertension, unspecified type  130/80, low-sodium, DASH diet, medication compliance, 150 minutes of moderate intensity exercise per week. Discussed medication compliance, adverse  effects. -     amLODipine (NORVASC) 10 MG tablet; Take 1 tablet (10 mg total) by mouth daily. -     losartan (COZAAR) 100 MG tablet; Take 0.5 tablets (50 mg total) by mouth daily.  Diabetic polyneuropathy associated with type 2 diabetes mellitus (HCC) -     losartan (COZAAR) 100 MG tablet; Take 0.5 tablets (50 mg total) by mouth daily.- blood pressure and renal protection   -     DULoxetine (CYMBALTA) 60 MG capsule; Take 1 capsule (60 mg total) by mouth daily.- antidepressant and helps with pain with DM polyneuropathy and gabapentin at maxium dose 3600mg  daily   Insomnia, unspecified type -     DULoxetine (CYMBALTA) 60 MG capsule; Take 1 capsule (60 mg total) by mouth daily.  Tobacco use disorder, continuous  Other orders -     metFORMIN (GLUCOPHAGE) 500 MG tablet; Take 1 tablet (500 mg total) by mouth daily with breakfast. -     glipiZIDE (GLUCOTROL) 10 MG tablet; Take 1 tablet (10 mg total) by mouth 2 (two) times daily before a meal. -     omeprazole (PRILOSEC) 20 MG capsule; Take 1 capsule (20 mg total) by mouth daily.     Meds ordered this encounter  Medications  . metFORMIN (GLUCOPHAGE) 500 MG tablet    Sig: Take 1 tablet (500 mg total) by mouth daily with breakfast.    Dispense:  90 tablet    Refill:  1  . gabapentin (NEURONTIN) 600 MG tablet    Sig: Take 2 tablets (1,200 mg total) by mouth 3 (three) times daily.    Dispense:  540 tablet    Refill:  1  . glipiZIDE (GLUCOTROL) 10 MG tablet    Sig: Take 1 tablet (10 mg  total) by mouth 2 (two) times daily before a meal.    Dispense:  180 tablet    Refill:  1  . amLODipine (NORVASC) 10 MG tablet    Sig: Take 1 tablet (10 mg total) by mouth daily.    Dispense:  90 tablet    Refill:  1  . omeprazole (PRILOSEC) 20 MG capsule    Sig: Take 1 capsule (20 mg total) by mouth daily.    Dispense:  90 capsule    Refill:  1  . losartan (COZAAR) 100 MG tablet    Sig: Take 0.5 tablets (50 mg total) by mouth daily.    Dispense:  90  tablet    Refill:  1  . DULoxetine (CYMBALTA) 60 MG capsule    Sig: Take 1 capsule (60 mg total) by mouth daily.    Dispense:  90 capsule    Refill:  1    Follow-up: Return in about 4 weeks (around 04/27/2019) for Bp tele than in person 3 months DM.    Kerin Perna, NP

## 2019-03-30 NOTE — Patient Instructions (Signed)

## 2019-07-22 ENCOUNTER — Emergency Department (HOSPITAL_COMMUNITY): Payer: Medicaid Other

## 2019-07-22 ENCOUNTER — Emergency Department (HOSPITAL_COMMUNITY)
Admission: EM | Admit: 2019-07-22 | Discharge: 2019-07-22 | Disposition: A | Discharge status: Home / Self Care | Payer: Medicaid Other | Attending: Emergency Medicine | Admitting: Emergency Medicine

## 2019-07-22 ENCOUNTER — Other Ambulatory Visit: Payer: Self-pay

## 2019-07-22 DIAGNOSIS — R072 Precordial pain: Secondary | ICD-10-CM | POA: Diagnosis not present

## 2019-07-22 DIAGNOSIS — E119 Type 2 diabetes mellitus without complications: Secondary | ICD-10-CM | POA: Insufficient documentation

## 2019-07-22 DIAGNOSIS — Z79899 Other long term (current) drug therapy: Secondary | ICD-10-CM | POA: Diagnosis not present

## 2019-07-22 DIAGNOSIS — Z7984 Long term (current) use of oral hypoglycemic drugs: Secondary | ICD-10-CM | POA: Diagnosis not present

## 2019-07-22 DIAGNOSIS — F17210 Nicotine dependence, cigarettes, uncomplicated: Secondary | ICD-10-CM | POA: Diagnosis not present

## 2019-07-22 DIAGNOSIS — R0789 Other chest pain: Secondary | ICD-10-CM | POA: Insufficient documentation

## 2019-07-22 DIAGNOSIS — R079 Chest pain, unspecified: Secondary | ICD-10-CM | POA: Diagnosis present

## 2019-07-22 LAB — CBC
HCT: 45.2 % (ref 39.0–52.0)
Hemoglobin: 14.2 g/dL (ref 13.0–17.0)
MCH: 28.2 pg (ref 26.0–34.0)
MCHC: 31.4 g/dL (ref 30.0–36.0)
MCV: 89.9 fL (ref 80.0–100.0)
Platelets: 344 10*3/uL (ref 150–400)
RBC: 5.03 MIL/uL (ref 4.22–5.81)
RDW: 12.5 % (ref 11.5–15.5)
WBC: 10.6 10*3/uL — ABNORMAL HIGH (ref 4.0–10.5)
nRBC: 0 % (ref 0.0–0.2)

## 2019-07-22 LAB — BASIC METABOLIC PANEL
Anion gap: 13 (ref 5–15)
BUN: 11 mg/dL (ref 6–20)
CO2: 23 mmol/L (ref 22–32)
Calcium: 9.4 mg/dL (ref 8.9–10.3)
Chloride: 103 mmol/L (ref 98–111)
Creatinine, Ser: 0.77 mg/dL (ref 0.61–1.24)
GFR calc Af Amer: >60 mL/min (ref >60)
GFR calc non Af Amer: >60 mL/min (ref >60)
Glucose, Bld: 185 mg/dL — ABNORMAL HIGH (ref 70–99)
Potassium: 4.1 mmol/L (ref 3.5–5.1)
Sodium: 139 mmol/L (ref 135–145)

## 2019-07-22 LAB — TROPONIN I (HIGH SENSITIVITY): Troponin I (High Sensitivity): 4 ng/L (ref <18)

## 2019-07-22 MED ORDER — BENZONATATE 100 MG PO CAPS
100.0000 mg | ORAL_CAPSULE | Freq: Three times a day (TID) | ORAL | 0 refills | Status: DC
Start: 2019-07-22 — End: 2019-08-16

## 2019-07-22 MED ORDER — SODIUM CHLORIDE 0.9% FLUSH: 3.0000 mL | Freq: Once | INTRAVENOUS | Status: DC

## 2019-07-22 MED ORDER — NAPROXEN 500 MG PO TABS
500.0000 mg | ORAL_TABLET | Freq: Two times a day (BID) | ORAL | 0 refills | Status: DC
Start: 2019-07-22 — End: 2019-08-16

## 2019-07-22 MED ORDER — NAPROXEN 250 MG PO TABS
500.0000 mg | ORAL_TABLET | Freq: Once | ORAL | Status: AC
  Administered 2019-07-22: 500 mg via ORAL
  Filled 2019-07-22: qty 2

## 2019-07-22 NOTE — ED Triage Notes (Signed)
Patient complains of right anterior cp since last night. Patient reports that the pain is worse with inspiration and movement, no cold symptoms

## 2019-07-22 NOTE — ED Notes (Signed)
Patient verbalizes understanding of discharge instructions. Opportunity for questioning and answers were provided. Armband removed by staff, pt discharged from ED ambulatory by self\  

## 2019-07-22 NOTE — Discharge Instructions (Signed)
Make sure to take the naproxen for your pain.  This is likely costochondritis which is inflammation of the chest wall muscles.  I have also written you a medication for cough.  Return if you develop any new or worsening symptoms such as leg swelling, coughing up blood, exertional chest pain, pain that radiates to your left arm, left jaw or back.

## 2019-07-22 NOTE — ED Provider Notes (Signed)
[facility] EMERGENCY DEPARTMENT Provider Note   CSN: [id number] Arrival date & time: 07/22/19  1055    History CP, SOB  Kyle Novak is a 54 y.o. male with past medical history who presents for evaluation of CP, SOB. Began last night approximately 21 hours PTA. Worse with deep breathing, movement, cough, palpation to chest wall. COVID 02/14/19. Current daily tobacco user.  No exertional chest pain.  No radiation to left arm, back, jaw.  No associated diaphoresis, nausea, vomiting.  No unilateral leg swelling, redness, warmth, recent surgery, mobilization, malignancy, history of PE or DVT.  No overlying erythema, warmth, fluctuance or induration.  No recent sick contacts.  Has had nonproductive cough without hemoptysis.  Pain does not occur at rest.  Described as aching sensation.  Rates his pain a 4/10.  Denies additional aggravating or alleviating factors.  History obtained from patient and past medical records.  No interpreter is used.  HPI     Past Medical History:  Diagnosis Date  . Diabetes mellitus without complication ([place])   . GERD (gastroesophageal reflux disease)   . Hypertension     Patient Active Problem List   Diagnosis Date Noted  . Acute medial meniscus tear of right knee 11/15/2018  . Chronic pain of right knee 03/22/2018  . 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 Spinal cord mass ([place]) 03/16/2017  . Diabetes mellitus without complication ([place])   . Hypertension     Past Surgical History:  Procedure Laterality Date  . KNEE ARTHROSCOPY WITH MEDIAL MENISECTOMY Right 11/23/2018   Procedure: RIGHT KNEE ARTHROSCOPY WITH PARTIAL MEDIAL MENISCECTOMY, SYNOVECTOMY;  Surgeon: Leandrew Koyanagi, MD;  Location: [place];  Service: Orthopedics;  Laterality: Right;  . NO PAST SURGERIES    . WISDOM TOOTH EXTRACTION         Family History  Problem Relation Age of Onset  . Lung cancer Father   . Heart failure Brother     Social History    Tobacco Use  . Smoking status: Current Every Day Smoker    Packs/day: 0.50    Types: Cigarettes  . Smokeless tobacco: Never Used  Substance Use Topics  . Alcohol use: Yes    Comment: OCCASIONAL  . Drug use: No    Home Medications Prior to Admission medications   Medication Sig Start Date End Date Taking? Authorizing Provider  albuterol (VENTOLIN HFA) 108 (90 Base) MCG/ACT inhaler Inhale 1-2 puffs into the lungs every 6 (six) hours as needed for wheezing or shortness of breath. 02/09/19   Jaynee Eagles, PA-C  amLODipine (NORVASC) 10 MG tablet Take 1 tablet (10 mg total) by mouth daily. 03/30/19   Kerin Perna, NP  benzonatate (TESSALON) 100 MG capsule Take 1 capsule (100 mg total) by mouth every 8 (eight) hours. 07/22/19   Abigael Mogle A, PA-C  diclofenac Sodium (VOLTAREN) 1 % GEL Apply 2 g topically 4 (four) times daily. 01/04/19   Aundra Dubin, PA-C  DULoxetine (CYMBALTA) 60 MG capsule Take 1 capsule (60 mg total) by mouth daily. 03/30/19   Kerin Perna, NP  gabapentin (NEURONTIN) 600 MG tablet Take 2 tablets (1,200 mg total) by mouth 3 (three) times daily. 03/30/19   Kerin Perna, NP  glipiZIDE (GLUCOTROL) 10 MG tablet Take 1 tablet (10 mg total) by mouth 2 (two) times daily before a meal. 03/30/19   Kerin Perna, NP  HYDROcodone-acetaminophen (NORCO) 5-325 MG tablet Take 1-2 tablets by mouth daily as needed  for moderate pain. 01/04/19   Aundra Dubin, PA-C  losartan (COZAAR) 100 MG tablet Take 0.5 tablets (50 mg total) by mouth daily. 03/30/19   Kerin Perna, NP  metFORMIN (GLUCOPHAGE) 500 MG tablet Take 1 tablet (500 mg total) by mouth daily with breakfast. 03/30/19   Kerin Perna, NP  Multiple Vitamin (MULITIVITAMIN WITH MINERALS) TABS Take 1 tablet by mouth daily.    [provider]  naproxen (NAPROSYN) 500 MG tablet Take 1 tablet (500 mg total) by mouth 2 (two) times daily. 07/22/19   Noble Bodie A, PA-C  omeprazole  (PRILOSEC) 20 MG capsule Take 1 capsule (20 mg total) by mouth daily. 03/30/19   Kerin Perna, NP  promethazine-dextromethorphan (PROMETHAZINE-DM) 6.25-15 MG/5ML syrup Take 5 mLs by mouth at bedtime as needed for cough. 02/09/19   Jaynee Eagles, PA-C  traMADol (ULTRAM) 50 MG tablet Take 50 mg by mouth as needed. 11/10/18   [provider]    Allergies    Patient has no known allergies.  Review of Systems   Review of Systems  Constitutional: Negative.   HENT: Negative.   Respiratory: Positive for cough. Negative for apnea, choking, chest tightness, shortness of breath, wheezing and stridor.   Cardiovascular: Positive for chest pain (Wall pain). Negative for palpitations and leg swelling.  Gastrointestinal: Negative.   Genitourinary: Negative.   Musculoskeletal: Negative.   Skin: Negative.   Neurological: Negative.   All other systems reviewed and are negative.   Physical Exam Updated Vital Signs BP 121/74 (BP Location: Right Arm)   Pulse 90   Temp 99.1 F (37.3 C) (Oral)   Resp 18   Ht 5\' 9"  (1.753 m)   Wt 83.9 kg   SpO2 98%   BMI 27.32 kg/m   Physical Exam Vitals and nursing note reviewed.  Constitutional:      General: He is not in acute distress.    Appearance: He is not ill-appearing, toxic-appearing or diaphoretic.  HENT:     Head: Normocephalic and atraumatic.     Jaw: There is normal jaw occlusion.     Nose: Nose normal.     Mouth/Throat:     Mouth: Mucous membranes are dry.  Eyes:     Extraocular Movements: Extraocular movements intact.  Neck:     Vascular: No carotid bruit or JVD.     Trachea: Trachea and phonation normal.     Meningeal: Brudzinski's sign and Kernig's sign absent.  Cardiovascular:     Rate and Rhythm: Normal rate.     Pulses: Normal pulses.          Radial pulses are 2+ on the right side and 2+ on the left side.       Dorsalis pedis pulses are 2+ on the right side and 2+ on the left side.       Posterior tibial pulses are  2+ on the right side and 2+ on the left side.     Heart sounds: Normal heart sounds.  Pulmonary:     Effort: Pulmonary effort is normal.     Breath sounds: Normal breath sounds and air entry.  Chest:     Chest wall: No mass, lacerations, deformity, swelling, tenderness, crepitus or edema.     Breasts:        Right: Normal.        Left: Normal.       Comments: No edema, erythema or warmth.  No fluctuance or induration. Abdominal:  General: Bowel sounds are normal. There is no distension.     Tenderness: There is no abdominal tenderness. There is no right CVA tenderness, left CVA tenderness, guarding or rebound.     Comments: Negative Murphy sign. Abd soft non tender without rebound or guarding  Musculoskeletal:        General: No swelling, tenderness, deformity or signs of injury. Normal range of motion.     Cervical back: Full passive range of motion without pain, normal range of motion and neck supple.     Right lower leg: No edema.     Left lower leg: No edema.     Comments: Moves all 4 extremities without difficulty.  Bevelyn Buckles' sign negative.  Feet:     Right foot:     Skin integrity: Skin integrity normal.     Left foot:     Skin integrity: Skin integrity normal.  Skin:    General: Skin is warm.     Capillary Refill: Capillary refill takes less than 2 seconds.     Comments: Brisk capillary refill.  No edema, erythema or warmth.  Neurological:     General: No focal deficit present.     Mental Status: He is alert and oriented to person, place, and time.    ED Results / Procedures / Treatments   Labs (all labs ordered are listed, but only abnormal results are displayed) Labs Reviewed  BASIC METABOLIC PANEL - Abnormal; Notable for the following components:      Result Value   Glucose, Bld 185 (*)    All other components within normal limits  CBC - Abnormal; Notable for the following components:   WBC 10.6 (*)    All other components within normal limits  TROPONIN I  (HIGH SENSITIVITY)  TROPONIN I (HIGH SENSITIVITY)    EKG EKG Interpretation  Date/Time:  Saturday July 22 2019 11:07:47 EDT Ventricular Rate:  84 PR Interval:  150 QRS Duration: 70 QT Interval:  326 QTC Calculation: 385 R Axis:   46 Text Interpretation: Normal sinus rhythm with sinus arrhythmia Low voltage QRS Cannot rule out Anterior infarct , age undetermined Abnormal ECG Confirmed by Lennice Sites [phone]) on 07/22/2019 1:24:38 PM   Radiology DG Chest 2 View  Result Date: 07/22/2019 CLINICAL DATA:  Chest pain EXAM: CHEST - 2 VIEW COMPARISON:  02/14/2019 FINDINGS: The heart size and mediastinal contours are within normal limits. Both lungs are clear. The visualized skeletal structures are unremarkable. IMPRESSION: No acute abnormality of the lungs. Electronically Signed   By: Eddie Candle M.D.   On: 07/22/2019 12:29    Procedures Procedures (including critical care time)  Medications Ordered in ED Medications  sodium chloride flush (NS) 0.9 % injection 3 mL (has no administration in time range)  naproxen (NAPROSYN) tablet 500 mg (500 mg Oral Given 07/22/19 1430)   ED Course  I have reviewed the triage vital signs and the nursing notes.  Pertinent labs & imaging results that were available during my care of the patient were reviewed by me and considered in my medical decision making (see chart for details).  54 year old male presents for evaluation of right-sided chest pain.  Nonexertional nature.  He is nonseptic, not ill-appearing.  Occurs with deep breathing, movement, palpation and with cough.  He has had nonproductive cough x1 week.  No hemoptysis.  No unilateral leg swelling, redness or warmth.  No recent immobilization, surgery, malignancy.  No history of PE or DVT.  No evidence of DVT on exam.  He is without tachycardia, tachypnea or hypoxia.  Symptoms do not seem consistent with ACS, dissection.  He is neurovascularly intact.  I am able to reproduce his pain on exam.   Question costochondritis given associated cough.  No overlying skin changes to suggest cellulitis, abscess or vesicular changes.  Symptoms started approximately 21 hours ago.    Labs and imaging personally viewed interpreted from triage: CBC mild leukocytosis at 98.3 Metabolic panel with mild hyperglycemia to 185, no history of diabetes, no elevated anion gap Chest x-ray without pulmonary edema, pneumothorax, infiltrates, cardiomegaly EKG without ischemic changes Troponin 4, do not feel any delta troponin at this time given timing of symptom onset.  If likely ACS related with expect elevated troponin at this time.  Heart Score- 3 Age, Risk Factors, Non specific repol, Wells criteria low risk  Abdomen soft, non-tender with negative Murphy sign I have low suspicion for atypical gallbladder pathology or intra-abdominal pathology as cause of his chest pain.  Assess patient.  Discussed symptoms with reassuring labs.  Discussed symptomatic management and return for new or worsening symptoms.  VSS, no tracheal deviation, no JVD or new murmur, RRR, breath sounds equal bilaterally, EKG without acute abnormalities, negative troponin, and negative CXR. Pt has been advised to return to the ED if CP becomes exertional, associated with diaphoresis or nausea, radiates to left jaw/arm, worsens or becomes concerning in any way. Pt appears reliable for follow up and is agreeable to discharge.   The patient has been appropriately medically screened and/or stabilized in the ED. I have low suspicion for any other emergent medical condition which would require further screening, evaluation or treatment in the ED or require inpatient management.  Patient is hemodynamically stable and in no acute distress.  Patient able to ambulate in department prior to ED.  Evaluation does not show acute pathology that would require ongoing or additional emergent interventions while in the emergency department or further inpatient  treatment.  I have discussed the diagnosis with the patient and answered all questions.  Pain is been managed while in the emergency department and patient has no further complaints prior to discharge.  Patient is comfortable with plan discussed in room and is stable for discharge at this time.  I have discussed strict return precautions for returning to the emergency department.  Patient was encouraged to follow-up with PCP/specialist refer to at discharge.     MDM Rules/Calculators/A&P                       Final Clinical Impression(s) / ED Diagnoses Final diagnoses:  Precordial pain  Chest wall pain    Rx / DC Orders ED Discharge Orders         Ordered    naproxen (NAPROSYN) 500 MG tablet  2 times daily     07/22/19 1409    benzonatate (TESSALON) 100 MG capsule  Every 8 hours     07/22/19 1409           Antrell Tipler A, PA-C 07/22/19 [place], Adam, DO 07/22/19 1628

## 2019-08-10 ENCOUNTER — Other Ambulatory Visit (INDEPENDENT_AMBULATORY_CARE_PROVIDER_SITE_OTHER): Payer: Self-pay | Admitting: Primary Care

## 2019-08-10 DIAGNOSIS — E1142 Type 2 diabetes mellitus with diabetic polyneuropathy: Secondary | ICD-10-CM

## 2019-08-10 DIAGNOSIS — G4700 Insomnia, unspecified: Secondary | ICD-10-CM

## 2019-08-16 ENCOUNTER — Ambulatory Visit (INDEPENDENT_AMBULATORY_CARE_PROVIDER_SITE_OTHER): Payer: Medicaid Other | Admitting: Primary Care

## 2019-08-16 ENCOUNTER — Other Ambulatory Visit: Payer: Self-pay

## 2019-08-16 ENCOUNTER — Encounter (INDEPENDENT_AMBULATORY_CARE_PROVIDER_SITE_OTHER): Payer: Self-pay | Admitting: Primary Care

## 2019-08-16 VITALS — BP 120/84 | HR 102 | Temp 98.7°F | Ht 69.0 in | Wt 176.2 lb

## 2019-08-16 DIAGNOSIS — E119 Type 2 diabetes mellitus without complications: Secondary | ICD-10-CM

## 2019-08-16 DIAGNOSIS — G8929 Other chronic pain: Secondary | ICD-10-CM

## 2019-08-16 DIAGNOSIS — M25561 Pain in right knee: Secondary | ICD-10-CM

## 2019-08-16 DIAGNOSIS — Z09 Encounter for follow-up examination after completed treatment for conditions other than malignant neoplasm: Secondary | ICD-10-CM

## 2019-08-16 DIAGNOSIS — G4700 Insomnia, unspecified: Secondary | ICD-10-CM

## 2019-08-16 DIAGNOSIS — F17209 Nicotine dependence, unspecified, with unspecified nicotine-induced disorders: Secondary | ICD-10-CM

## 2019-08-16 DIAGNOSIS — I10 Essential (primary) hypertension: Secondary | ICD-10-CM

## 2019-08-16 LAB — POCT GLYCOSYLATED HEMOGLOBIN (HGB A1C): Hemoglobin A1C: 6 % — AB (ref 4.0–5.6)

## 2019-08-16 MED ORDER — OMEPRAZOLE 20 MG PO CPDR: 20.0000 mg | DELAYED_RELEASE_CAPSULE | Freq: Every day | ORAL | 1 refills | Status: DC

## 2019-08-16 MED ORDER — TRULICITY 0.75 MG/0.5ML ~~LOC~~ SOAJ
0.7500 mg | SUBCUTANEOUS | 1 refills | Status: DC
Start: 2019-08-16 — End: 2019-12-01

## 2019-08-16 MED ORDER — GLIPIZIDE 10 MG PO TABS: 10.0000 mg | ORAL_TABLET | Freq: Two times a day (BID) | ORAL | 1 refills | Status: DC

## 2019-08-16 MED ORDER — LOSARTAN POTASSIUM 100 MG PO TABS: 50.0000 mg | ORAL_TABLET | Freq: Every day | ORAL | 1 refills | Status: DC

## 2019-08-16 MED ORDER — METFORMIN HCL 500 MG PO TABS: 500.0000 mg | ORAL_TABLET | Freq: Every day | ORAL | 1 refills | Status: DC

## 2019-08-16 MED ORDER — GABAPENTIN 600 MG PO TABS: 1200.0000 mg | ORAL_TABLET | Freq: Three times a day (TID) | ORAL | 1 refills | Status: DC

## 2019-08-16 MED ORDER — DULOXETINE HCL 60 MG PO CPEP: 60.0000 mg | ORAL_CAPSULE | Freq: Every day | ORAL | 1 refills | Status: DC

## 2019-08-16 MED ORDER — ALBUTEROL SULFATE HFA 108 (90 BASE) MCG/ACT IN AERS: 1.0000 | INHALATION_SPRAY | Freq: Four times a day (QID) | RESPIRATORY_TRACT | 1 refills | Status: DC | PRN

## 2019-08-16 MED ORDER — AMLODIPINE BESYLATE 10 MG PO TABS: 10.0000 mg | ORAL_TABLET | Freq: Every day | ORAL | 1 refills | Status: DC

## 2019-08-16 NOTE — Progress Notes (Signed)
[facility]   Assessment and Plan: Samuel was seen today for diabetes.  Diagnoses and all orders for this visit:  Diabetes mellitus without complication (HCC) -     HgB A1c 6.0 unremarkable Trulicity 0.75mg  weekly was added discontinue glucotrol    Your A1C is a measure of your sugar over the past 3 months and is not affected by what you have eaten over the past few days. D Please make sure you decrease bad carbs like white bread, white rice, potatoes, corn, soft drinks, pasta, cereals, refined sugars, sweet tea, dried fruits, and fruit juice. Good carbs are okay to eat in moderation like sweet potatoes, brown rice, whole grain pasta/bread, most fruit (except dried fruit) and you can eat as many veggies as you want.   Greater than 6.5 is considered diabetic. Between 6.4 and 5.7 is prediabetic If your A1C is less than 5.7 you are NOT diabetic.  Targets for Glucose Readings: Time of Check Target for patients WITHOUT Diabetes Target for DIABETICS  Before Meals Less than 100  less than 150  Two hours after meals Less than 200  Less than [facility] discharge follow-up Patient present  To the emergency room on 07/22/2019 for chest pain radiates to the right side under his breast bone. Evaluation does not show acute pathology that would require  emergent interventions.   Tobacco use disorder, continuous He is aware of increased risk for lung cancer and other respiratory diseases recommend cessation.  This will be reminded at each clinical visit.  Essential hypertension Well controlled 120/84 on Losartan 50 mg . We have discussed target BP range and blood pressure goal. I have advised patient to check BP regularly and to call us back or report to clinic if the numbers are consistently higher than 140/90. We discussed the importance of compliance with medical therapy and DASH diet recommended, consequences of uncontrolled hypertension discussed.  - continue current BP  medications Losartan 50mg   Insomnia, unspecified type Cymbalta 60mg  dual purpose depression, sleep and pain Continue   HPI  Mr. Alfons Sulkowski 54 y.o.male presents for follow up from the hospital discharge  He was seen on 07/22/19, in the emergency room  patient was discharged from the l emergency room  On the same day , patient and diagnosis with : precordial pain Chest wall pain. He is also, in today for diabetes and hypertension management. He denies shortness of breath, headaches, chest pain or lower extremity edema He is also complaining of a dry tickle in his throat since he had COVID. Will review medications.    Past Medical History:  Diagnosis Date  . Diabetes mellitus without complication ([place])   . GERD (gastroesophageal reflux disease)   . Hypertension      No Known Allergies    Current Outpatient Medications on File Prior to Visit  Medication Sig Dispense Refill  . amLODipine (NORVASC) 10 MG tablet Take 1 tablet (10 mg total) by mouth daily. 90 tablet 1  . DULoxetine (CYMBALTA) 60 MG capsule TAKE 1 CAPSULE (60 MG TOTAL) BY MOUTH DAILY. 90 capsule 1  . gabapentin (NEURONTIN) 600 MG tablet Take 2 tablets (1,200 mg total) by mouth 3 (three) times daily. 540 tablet 1  . glipiZIDE (GLUCOTROL) 10 MG tablet Take 1 tablet (10 mg total) by mouth 2 (two) times daily before a meal. 180 tablet 1  . losartan (COZAAR) 100 MG tablet Take 0.5 tablets (50 mg total) by mouth daily. 90 tablet 1  .  metFORMIN (GLUCOPHAGE) 500 MG tablet Take 1 tablet (500 mg total) by mouth daily with breakfast. 90 tablet 1  . Multiple Vitamin (MULITIVITAMIN WITH MINERALS) TABS Take 1 tablet by mouth daily.    Marland Kitchen omeprazole (PRILOSEC) 20 MG capsule Take 1 capsule (20 mg total) by mouth daily. 90 capsule 1  . traMADol (ULTRAM) 50 MG tablet Take 50 mg by mouth as needed.    Marland Kitchen albuterol (VENTOLIN HFA) 108 (90 Base) MCG/ACT inhaler Inhale 1-2 puffs into the lungs every 6 (six) hours as needed for wheezing or shortness  of breath. (Patient not taking: Reported on 08/16/2019) 18 g 0  . benzonatate (TESSALON) 100 MG capsule Take 1 capsule (100 mg total) by mouth every 8 (eight) hours. (Patient not taking: Reported on 08/16/2019) 21 capsule 0  . diclofenac Sodium (VOLTAREN) 1 % GEL Apply 2 g topically 4 (four) times daily. 150 g 1   Current Facility-Administered Medications on File Prior to Visit  Medication Dose Route Frequency Provider Last Rate Last Admin  . acetaminophen (TYLENOL) tablet 650 mg  650 mg Oral Q4H PRN Lorriane Shire, MD        ROS:   Dry cough with tickle since COVID  all negative except above. Physical Exam: BP 120/84 (BP Location: Left Arm, Patient Position: Sitting, Cuff Size: Normal)   Pulse (!) 102   Temp 98.7 F (37.1 C) (Oral)   Ht 5\' 9"  (1.753 m)   Wt 176 lb 3.2 oz (79.9 kg)   SpO2 96%   BMI 26.02 kg/m  General Appearance: Well nourished, in no apparent distress. Eyes: PERRLA, EOMs, conjunctiva no swelling or erythema Sinuses: No Frontal/maxillary tenderness ENT/Mouth: Ext aud canals clear, TMs without erythema, bulging. Marland Kitchen Hearing normal.  Neck: Supple, thyroid normal.  Respiratory: Respiratory effort normal, BS equal bilaterally without rales, rhonchi, wheezing or stridor.  Cardio: RRR with no MRGs. Brisk peripheral pulses without edema.  Abdomen: Soft, + BS.  Non tender, no guarding, rebound, hernias, masses. Lymphatics: Non tender without lymphadenopathy.  Musculoskeletal: Full ROM, 5/5 strength, normal gait.  Skin: Warm, dry without rashes, lesions, ecchymosis.  Neuro: Cranial nerves intact. Normal muscle tone, no cerebellar symptoms. Sensation intact.  Psych: Awake and oriented X 3, normal affect, Insight and Judgment appropriate.     Kerin Perna, NP 2:36 PM

## 2019-08-16 NOTE — Patient Instructions (Addendum)
  A1C is 6.0 his pain clinic doctor added Trulicity .075 mg weekly discontinued glucotrol .  Costochondritis Costochondritis is swelling and irritation (inflammation) of the tissue (cartilage) that connects your ribs to your breastbone (sternum). This causes pain in the front of your chest. Usually, the pain:  Starts gradually.  Is in more than one rib. This condition usually goes away on its own over time. Follow these instructions at home:  Do not do anything that makes your pain worse.  If directed, put ice on the painful area: ? Put ice in a plastic bag. ? Place a towel between your skin and the bag. ? Leave the ice on for 20 minutes, 2-3 times a day.  If directed, put heat on the affected area as often as told by your doctor. Use the heat source that your doctor tells you to use, such as a moist heat pack or a heating pad. ? Place a towel between your skin and the heat source. ? Leave the heat on for 20-30 minutes. ? Take off the heat if your skin turns bright red. This is very important if you cannot feel pain, heat, or cold. You may have a greater risk of getting burned.  Take over-the-counter and prescription medicines only as told by your doctor.  Return to your normal activities as told by your doctor. Ask your doctor what activities are safe for you.  Keep all follow-up visits as told by your doctor. This is important. Contact a doctor if:  You have chills or a fever.  Your pain does not go away or it gets worse.  You have a cough that does not go away. Get help right away if:  You are short of breath. This information is not intended to replace advice given to you by your health care provider. Make sure you discuss any questions you have with your health care provider. Document Revised: 02/17/2017 Document Reviewed: 05/29/2015 Elsevier Patient Education  2020 [employer].

## 2019-08-16 NOTE — Progress Notes (Signed)
Pt still complains of precordial pain on the right side  Also complains of a dry cough

## 2019-09-12 ENCOUNTER — Other Ambulatory Visit (INDEPENDENT_AMBULATORY_CARE_PROVIDER_SITE_OTHER): Payer: Self-pay | Admitting: Primary Care

## 2019-09-12 DIAGNOSIS — E119 Type 2 diabetes mellitus without complications: Secondary | ICD-10-CM

## 2019-09-15 ENCOUNTER — Other Ambulatory Visit (INDEPENDENT_AMBULATORY_CARE_PROVIDER_SITE_OTHER): Payer: Medicaid Other

## 2019-09-15 ENCOUNTER — Other Ambulatory Visit: Payer: Self-pay

## 2019-09-15 DIAGNOSIS — E119 Type 2 diabetes mellitus without complications: Secondary | ICD-10-CM

## 2019-09-16 LAB — LIPID PANEL
Chol/HDL Ratio: 3.8 ratio (ref 0.0–5.0)
Cholesterol, Total: 147 mg/dL (ref 100–199)
HDL: 39 mg/dL — ABNORMAL LOW (ref >39)
LDL Chol Calc (NIH): 94 mg/dL (ref 0–99)
Triglycerides: 70 mg/dL (ref 0–149)
VLDL Cholesterol Cal: 14 mg/dL (ref 5–40)

## 2019-09-18 ENCOUNTER — Other Ambulatory Visit (INDEPENDENT_AMBULATORY_CARE_PROVIDER_SITE_OTHER): Payer: Self-pay | Admitting: Primary Care

## 2019-09-18 DIAGNOSIS — E119 Type 2 diabetes mellitus without complications: Secondary | ICD-10-CM

## 2019-09-18 MED ORDER — ATORVASTATIN CALCIUM 10 MG PO TABS: 10.0000 mg | ORAL_TABLET | Freq: Every day | ORAL | 3 refills | Status: DC

## 2019-09-19 ENCOUNTER — Telehealth (INDEPENDENT_AMBULATORY_CARE_PROVIDER_SITE_OTHER): Payer: Self-pay

## 2019-09-19 NOTE — Telephone Encounter (Signed)
-----   Message from Kerin Perna, NP sent at 09/18/2019  8:56 AM EDT ----- Cholesterol  panel is good except HDL indicating increase in exercise. Sent in low dose cholesterol medication atorvastatin 10 take at bed time. This is a ADA guideline

## 2019-09-19 NOTE — Telephone Encounter (Signed)
Patient aware that cholesterol is normal. But per guidelines due to him being diabetic a low dose cholesterol medication has been sent to his pharmacy. Instructed patient to take medication before bed. He was also advised to increase exercise. He did not have any questions and verbalized understanding of results. Nat Christen, CMA

## 2019-12-01 ENCOUNTER — Other Ambulatory Visit (INDEPENDENT_AMBULATORY_CARE_PROVIDER_SITE_OTHER): Payer: Self-pay | Admitting: Primary Care

## 2019-12-01 NOTE — Telephone Encounter (Signed)
Sent to PCP to refill.

## 2020-01-08 ENCOUNTER — Other Ambulatory Visit (INDEPENDENT_AMBULATORY_CARE_PROVIDER_SITE_OTHER): Payer: Self-pay | Admitting: Primary Care

## 2020-02-06 ENCOUNTER — Other Ambulatory Visit (INDEPENDENT_AMBULATORY_CARE_PROVIDER_SITE_OTHER): Payer: Self-pay | Admitting: Primary Care

## 2020-02-06 DIAGNOSIS — M25561 Pain in right knee: Secondary | ICD-10-CM

## 2020-02-21 ENCOUNTER — Other Ambulatory Visit (INDEPENDENT_AMBULATORY_CARE_PROVIDER_SITE_OTHER): Payer: Self-pay | Admitting: Primary Care

## 2020-03-01 ENCOUNTER — Other Ambulatory Visit (INDEPENDENT_AMBULATORY_CARE_PROVIDER_SITE_OTHER): Payer: Self-pay | Admitting: Primary Care

## 2020-03-01 ENCOUNTER — Telehealth: Payer: Self-pay

## 2020-03-01 DIAGNOSIS — I10 Essential (primary) hypertension: Secondary | ICD-10-CM

## 2020-03-01 MED ORDER — TRULICITY 0.75 MG/0.5ML ~~LOC~~ SOAJ: 0.7500 mg | SUBCUTANEOUS | 0 refills | Status: DC

## 2020-03-01 NOTE — Telephone Encounter (Signed)
Pt called saying he needs a refill on his Trulicity.  He takes iton Wednesday bur did not take it this week because he was already out.  He uses [facility]   [phone]

## 2020-03-01 NOTE — Telephone Encounter (Signed)
PA for Trulicity approved until 02/2021

## 2020-03-08 ENCOUNTER — Other Ambulatory Visit (INDEPENDENT_AMBULATORY_CARE_PROVIDER_SITE_OTHER): Payer: Self-pay | Admitting: Primary Care

## 2020-03-08 DIAGNOSIS — G4700 Insomnia, unspecified: Secondary | ICD-10-CM

## 2020-03-08 NOTE — Telephone Encounter (Signed)
Sent to PCP to refill if appropriate.  

## 2020-03-19 ENCOUNTER — Ambulatory Visit (INDEPENDENT_AMBULATORY_CARE_PROVIDER_SITE_OTHER): Payer: Medicaid Other | Admitting: Primary Care

## 2020-03-19 ENCOUNTER — Encounter (INDEPENDENT_AMBULATORY_CARE_PROVIDER_SITE_OTHER): Payer: Self-pay | Admitting: Primary Care

## 2020-03-19 ENCOUNTER — Other Ambulatory Visit: Payer: Self-pay

## 2020-03-19 VITALS — BP 135/87 | HR 100 | Temp 97.7°F | Ht 69.0 in | Wt 185.4 lb

## 2020-03-19 DIAGNOSIS — E119 Type 2 diabetes mellitus without complications: Secondary | ICD-10-CM

## 2020-03-19 LAB — POCT GLYCOSYLATED HEMOGLOBIN (HGB A1C): Hemoglobin A1C: 6.9 % — AB (ref 4.0–5.6)

## 2020-03-19 NOTE — Patient Instructions (Signed)
Tobacco Use Disorder Tobacco use disorder (TUD) occurs when a person craves, seeks, and uses tobacco, regardless of the consequences. This disorder can cause problems with mental and physical health. It can affect your ability to have healthy relationships, and it can keep you from meeting your responsibilities at work, home, or school. Tobacco may be:  Smoked as a cigarette or cigar.  Inhaled using e-cigarettes.  Smoked in a pipe or hookah.  Chewed as smokeless tobacco.  Inhaled into the nostrils as snuff. Tobacco products contain a dangerous chemical called nicotine, which is very addictive. Nicotine triggers hormones that make the body feel stimulated and works on areas of the brain that make you feel good. These effects can make it hard for people to quit nicotine. Tobacco contains many other unsafe chemicals that can damage almost every organ in the body. Smoking tobacco also puts others in danger due to fire risk and possible health problems caused by breathing in secondhand smoke. What are the signs or symptoms? Symptoms of TUD may include:  Being unable to slow down or stop your tobacco use.  Spending an abnormal amount of time getting or using tobacco.  Craving tobacco. Cravings may last for up to 6 months after quitting.  Tobacco use that: ? Interferes with your work, school, or home life. ? Interferes with your personal and social relationships. ? Makes you give up activities that you once enjoyed or found important.  Using tobacco even though you know that it is: ? Dangerous or bad for your health or someone else's health. ? Causing problems in your life.  Needing more and more of the substance to get the same effect (developing tolerance).  Experiencing unpleasant symptoms if you do not use the substance (withdrawal). Withdrawal symptoms may include: ? Depressed, anxious, or irritable mood. ? Difficulty concentrating. ? Increased appetite. ? Restlessness or trouble  sleeping.  Using the substance to avoid withdrawal. How is this diagnosed? This condition may be diagnosed based on:  Your current and past tobacco use. Your health care provider may ask questions about how your tobacco use affects your life.  A physical exam. You may be diagnosed with TUD if you have at least two symptoms within a 12-month period. How is this treated? This condition is treated by stopping tobacco use. Many people are unable to quit on their own and need help. Treatment may include:  Nicotine replacement therapy (NRT). NRT provides nicotine without the other harmful chemicals in tobacco. NRT gradually lowers the dosage of nicotine in the body and reduces withdrawal symptoms. NRT is available as: ? Over-the-counter gums, lozenges, and skin patches. ? Prescription mouth inhalers and nasal sprays.  Medicine that acts on the brain to reduce cravings and withdrawal symptoms.  A type of talk therapy that examines your triggers for tobacco use, how to avoid them, and how to cope with cravings (behavioral therapy).  Hypnosis. This may help with withdrawal symptoms.  Joining a support group for others coping with TUD. The best treatment for TUD is usually a combination of medicine, talk therapy, and support groups. Recovery can be a long process. Many people start using tobacco again after stopping (relapse). If you relapse, it does not mean that treatment will not work. Follow these instructions at home: Lifestyle  Do not use any products that contain nicotine or tobacco, such as cigarettes and e-cigarettes.  Avoid things that trigger tobacco use as much as you can. Triggers include people and situations that usually cause you to   use tobacco.  Avoid drinks that contain caffeine, including coffee. These may worsen some withdrawal symptoms.  Find ways to manage stress. Wanting to smoke may cause stress, and stress can make you want to smoke. Relaxation techniques such as deep  breathing, meditation, and yoga may help.  Attend support groups as needed. These groups are an important part of long-term recovery for many people. General instructions  Take over-the-counter and prescription medicines only as told by your health care provider.  Check with your health care provider before taking any new prescription or over-the-counter medicines.  Decide on a friend, family member, or smoking quit-line (such as 1-800-QUIT-NOW in the [place] that you can call or text when you feel the urge to smoke or when you need help coping with cravings.  Keep all follow-up visits as told by your health care provider and therapist. This is important.   Contact a health care provider if:  You are not able to take your medicines as prescribed.  Your symptoms get worse, even with treatment. Summary  Tobacco use disorder (TUD) occurs when a person craves, seeks, and uses tobacco regardless of the consequences.  This condition may be diagnosed based on your current and past tobacco use and a physical exam.  Many people are unable to quit on their own and need help. Recovery can be a long process.  The most effective treatment for TUD is usually a combination of medicine, talk therapy, and support groups. This information is not intended to replace advice given to you by your health care provider. Make sure you discuss any questions you have with your health care provider. Document Revised: 01/20/2017 Document Reviewed: 01/20/2017 Elsevier Patient Education  2021 Elsevier Inc.  

## 2020-03-19 NOTE — Progress Notes (Signed)
Kyle Novak is a 55 y.o. male who presents for an follow up evaluation of Type 2 diabetes mellitus.  Current symptoms/problems include  Current diabetic medications include  Metformin 500mg  QAM and trulicity .75mg  weekly   The patient was initially diagnosed with Type 2 diabetes mellitus based on the following criteria:  A1C 6.9  Current monitoring regimen: home blood tests - 3 times weekly Home blood sugar records: fasting range: 100-135 Any episodes of hypoglycemia? no  Known diabetic complications: none Cardiovascular risk factors: diabetes mellitus, hypertension and smoking/ tobacco exposure Eye exam current (within one year): no Weight trend: stable Prior visit with CDE: no Current diet: in general, a "healthy" diet   Current exercise: walking Medication Compliance?  Yes   Is He on ACE inhibitor or angiotensin II receptor blocker?  Yes  losartan (Cozaar)   Review of Systems  All other systems reviewed and are negative.   Objective:    BP 135/87 (BP Location: Left Arm, Patient Position: Sitting, Cuff Size: Normal)   Pulse 100   Temp 97.7 F (36.5 C) (Temporal)   Ht 5\' 9"  (1.753 m)   Wt 185 lb 6.4 oz (84.1 kg)   SpO2 94%   BMI 27.38 kg/m   Physical Exam Vitals reviewed.  HENT:     Right Ear: External ear normal.     Left Ear: External ear normal.     Nose: Nose normal.  Eyes:     Extraocular Movements: Extraocular movements intact.  Cardiovascular:     Rate and Rhythm: Normal rate and regular rhythm.  Pulmonary:     Effort: Pulmonary effort is normal.     Breath sounds: Normal breath sounds.  Abdominal:     General: Bowel sounds are normal. There is distension.     Palpations: Abdomen is soft.  Musculoskeletal:        General: Normal range of motion.     Cervical back: Normal range of motion.  Skin:    General: Skin is warm and dry.  Neurological:     Mental Status: He is alert and oriented to person, place, and time.  Psychiatric:         Mood and Affect: Mood normal.        Behavior: Behavior normal.        Thought Content: Thought content normal.        Judgment: Judgment normal.      Lab Review Glucose, Bld (mg/dL)  Date Value  07/22/2019 185 (H)  02/07/2019 153 (H)  11/17/2018 183 (H)   CO2 (mmol/L)  Date Value  07/22/2019 23  02/07/2019 25  11/17/2018 24   BUN (mg/dL)  Date Value  07/22/2019 11  02/07/2019 14  11/17/2018 12  09/22/2018 9   Creatinine, Ser (mg/dL)  Date Value  07/22/2019 0.77  02/07/2019 0.94  11/17/2018 0.82      Assessment:    Diabetes Mellitus type II, under good control.   Kyle Novak was seen today for diabetes.  Diagnoses and all orders for this visit:  Diabetes mellitus without complication (HCC) -     HgB A1c 6.9 previously 6.0     Plan:    1.  Rx changes: none 2.    A1C (<7), blood pressure (<130/80), and cholesterol (LDL <100). 3. CHO counting diet discussed.  Reviewed CHO amount in various foods and how to read nutrition labels.  Discussed recommended serving sizes.  4.  Recommend check BG 1  times a day 5.  Recommended increase physical activity - goal is 150 minutes per week 6. Follow up: 3 months

## 2020-06-06 ENCOUNTER — Other Ambulatory Visit (INDEPENDENT_AMBULATORY_CARE_PROVIDER_SITE_OTHER): Payer: Self-pay | Admitting: Primary Care

## 2020-06-06 DIAGNOSIS — I10 Essential (primary) hypertension: Secondary | ICD-10-CM

## 2020-07-08 ENCOUNTER — Other Ambulatory Visit (INDEPENDENT_AMBULATORY_CARE_PROVIDER_SITE_OTHER): Payer: Self-pay | Admitting: Primary Care

## 2020-07-08 DIAGNOSIS — I10 Essential (primary) hypertension: Secondary | ICD-10-CM

## 2020-08-06 ENCOUNTER — Other Ambulatory Visit (INDEPENDENT_AMBULATORY_CARE_PROVIDER_SITE_OTHER): Payer: Self-pay | Admitting: Primary Care

## 2020-08-06 DIAGNOSIS — G4700 Insomnia, unspecified: Secondary | ICD-10-CM

## 2020-08-26 ENCOUNTER — Other Ambulatory Visit (INDEPENDENT_AMBULATORY_CARE_PROVIDER_SITE_OTHER): Payer: Self-pay | Admitting: Primary Care

## 2020-08-26 NOTE — Telephone Encounter (Signed)
Sent to PCP to refill if appropriate.  

## 2020-09-18 ENCOUNTER — Other Ambulatory Visit (INDEPENDENT_AMBULATORY_CARE_PROVIDER_SITE_OTHER): Payer: Self-pay | Admitting: Primary Care

## 2020-09-18 DIAGNOSIS — I10 Essential (primary) hypertension: Secondary | ICD-10-CM

## 2020-09-18 NOTE — Telephone Encounter (Signed)
Needs appt before meds run out

## 2020-09-18 NOTE — Telephone Encounter (Signed)
Sent to PCP ?

## 2020-10-07 ENCOUNTER — Other Ambulatory Visit (INDEPENDENT_AMBULATORY_CARE_PROVIDER_SITE_OTHER): Payer: Self-pay | Admitting: Primary Care

## 2020-10-07 DIAGNOSIS — I10 Essential (primary) hypertension: Secondary | ICD-10-CM

## 2020-10-07 DIAGNOSIS — G4700 Insomnia, unspecified: Secondary | ICD-10-CM

## 2020-10-07 NOTE — Telephone Encounter (Signed)
Requested medication (s) are due for refill today:  yes  Requested medication (s) are on the active medication list: yes   Last refill:  08/29/2020  Future visit scheduled:   no  Notes to clinic:  overdue for follow up and labs  Vm left for patient    Requested Prescriptions  Pending Prescriptions Disp Refills   TRULICITY [id number] SOPN [Pharmacy Med Name: TRULICITY [id number] SUBCUTANEOUS SOLUTION PEN-INJECTOR] 2 mL 0    Sig: INJECT 0.75 MG INTO THE SKIN ONCE A WEEK.     Endocrinology:  Diabetes - GLP-1 Receptor Agonists Failed - 10/07/2020  1:17 PM      Failed - HBA1C is between 0 and 7.9 and within 180 days    Hemoglobin A1C  Date Value Ref Range Status  03/19/2020 6.9 (A) 4.0 - 5.6 % Final   Hgb A1c MFr Bld  Date Value Ref Range Status  03/19/2017 7.6 (H) 4.8 - 5.6 % Final    Comment:    (NOTE) Pre diabetes:          5.7%-6.4% Diabetes:              >6.4% Glycemic control for   <7.0% adults with diabetes           Failed - Valid encounter within last 6 months    Recent Outpatient Visits           6 months ago Diabetes mellitus without complication ([place])   [place] [facility] Kerin Perna, NP   1 year ago Diabetes mellitus without complication ([place])   [place] [facility] Kerin Perna, NP   1 year ago Diabetes mellitus without complication ([place])   [place] [facility] Kerin Perna, NP   1 year ago Diabetes mellitus without complication ([place])   [place], Michelle P, NP   2 years ago Chronic pain of right knee   [place], Michelle P, NP               DULoxetine (CYMBALTA) 60 MG capsule [Pharmacy Med Name: DULOXETINE HCL 60 MG ORAL CAPSULE DELAYED RELEASE PARTICLES] 90 capsule 1    Sig: TAKE 1 (ONE) CAPSULE (60 MG TOTAL) BY MOUTH DAILY.     Psychiatry: Antidepressants - SNRI Failed - 10/07/2020  1:17 PM      Failed -  Valid encounter within last 6 months    Recent Outpatient Visits           6 months ago Diabetes mellitus without complication ([place])   [place] [facility] Kerin Perna, NP   1 year ago Diabetes mellitus without complication ([place])   [place] Kerin Perna, NP   1 year ago Diabetes mellitus without complication ([place])   [place] Kerin Perna, NP   1 year ago Diabetes mellitus without complication ([place])   [place] Kerin Perna, NP   2 years ago Chronic pain of right knee   [facility] Juluis Mire P, NP              Passed - Last BP in normal range    BP Readings from Last 1 Encounters:  03/19/20 135/87           amLODipine (NORVASC) 10 MG tablet [Pharmacy Med Name: AMLODIPINE BESYLATE 10 MG ORAL TABLET] 90 tablet  0    Sig: TAKE 1 TABLET (10 MG TOTAL) BY MOUTH DAILY.     Cardiovascular:  Calcium Channel Blockers Failed - 10/07/2020  1:17 PM      Failed - Valid encounter within last 6 months    Recent Outpatient Visits           6 months ago Diabetes mellitus without complication ([place])   [place] [facility] Kerin Perna, NP   1 year ago Diabetes mellitus without complication ([place])   [place] Kerin Perna, NP   1 year ago Diabetes mellitus without complication ([place])   [place] Kerin Perna, NP   1 year ago Diabetes mellitus without complication ([place])   [place] Kerin Perna, NP   2 years ago Chronic pain of right knee   [place] Kerin Perna, NP              Passed - Last BP in normal range    BP Readings from Last 1 Encounters:  03/19/20 135/87

## 2020-10-07 NOTE — Telephone Encounter (Signed)
Sent to PCP ?

## 2020-10-09 ENCOUNTER — Other Ambulatory Visit (INDEPENDENT_AMBULATORY_CARE_PROVIDER_SITE_OTHER): Payer: Self-pay | Admitting: Primary Care

## 2020-10-09 DIAGNOSIS — G4700 Insomnia, unspecified: Secondary | ICD-10-CM

## 2020-10-09 DIAGNOSIS — I10 Essential (primary) hypertension: Secondary | ICD-10-CM

## 2020-10-09 NOTE — Telephone Encounter (Signed)
Notes to clinic:  Patient has been scheduled for 10/18/2020 Review for courtesy refill until appt   Requested Prescriptions  Pending Prescriptions Disp Refills   Dulaglutide (TRULICITY) A999333 0000000 SOPN 2 mL 0    Sig: Inject 0.75 mg into the skin once a week.     Endocrinology:  Diabetes - GLP-1 Receptor Agonists Failed - 10/09/2020  1:58 PM      Failed - HBA1C is between 0 and 7.9 and within 180 days    Hemoglobin A1C  Date Value Ref Range Status  03/19/2020 6.9 (A) 4.0 - 5.6 % Final   Hgb A1c MFr Bld  Date Value Ref Range Status  03/19/2017 7.6 (H) 4.8 - 5.6 % Final    Comment:    (NOTE) Pre diabetes:          5.7%-6.4% Diabetes:              >6.4% Glycemic control for   <7.0% adults with diabetes           Failed - Valid encounter within last 6 months    Recent Outpatient Visits           6 months ago Diabetes mellitus without complication ([place])   [place] [facility] Kerin Perna, NP   1 year ago Diabetes mellitus without complication ([place])   [place] [facility] Kerin Perna, NP   1 year ago Diabetes mellitus without complication ([place])   [place] Kerin Perna, NP   1 year ago Diabetes mellitus without complication ([place])   [place], Michelle P, NP   2 years ago Chronic pain of right knee   [place], Michelle P, NP       Future Appointments             In 1 week Oletta Lamas, [place] Cage, NP [facility]             amLODipine (NORVASC) 10 MG tablet 90 tablet 0    Sig: Take 1 tablet (10 mg total) by mouth daily.     Cardiovascular:  Calcium Channel Blockers Failed - 10/09/2020  1:58 PM      Failed - Valid encounter within last 6 months    Recent Outpatient Visits           6 months ago Diabetes mellitus without complication ([place])   [place] [facility] Kerin Perna, NP    1 year ago Diabetes mellitus without complication ([place])   [place] Kerin Perna, NP   1 year ago Diabetes mellitus without complication ([place])   [place] Kerin Perna, NP   1 year ago Diabetes mellitus without complication ([place])   [place], Michelle P, NP   2 years ago Chronic pain of right knee   [place] Kerin Perna, NP       Future Appointments             In 1 week Kerin Perna, NP [place] [employer] BP in normal range    BP Readings from Last 1 Encounters:  03/19/20 135/87           DULoxetine (CYMBALTA) 60 MG capsule 90 capsule 1  Psychiatry: Antidepressants - SNRI Failed - 10/09/2020  1:58 PM      Failed - Valid encounter within last 6 months    Recent Outpatient Visits           6 months ago Diabetes mellitus without complication ([place])   [place] [facility] Kerin Perna, NP   1 year ago Diabetes mellitus without complication ([place])   [place] Kerin Perna, NP   1 year ago Diabetes mellitus without complication ([place])   [place] Kerin Perna, NP   1 year ago Diabetes mellitus without complication ([place])   [place], Michelle P, NP   2 years ago Chronic pain of right knee   [place] Kerin Perna, NP       Future Appointments             In 1 week Kerin Perna, NP [place] BP in normal range    BP Readings from Last 1 Encounters:  03/19/20 135/87

## 2020-10-09 NOTE — Telephone Encounter (Signed)
Sent to PCP to refill if appropriate.  

## 2020-10-09 NOTE — Telephone Encounter (Signed)
Copied from [place] [id number]. Topic: Quick Communication - Rx Refill/Question >> Oct 09, 2020  1:48 PM Yvette Rack wrote: Medication: TRULICITY [id number] SOPN, DULoxetine (CYMBALTA) 60 MG capsule, and amLODipine (NORVASC) 10 MG tablet  Has the patient contacted their pharmacy? Yes.   (Agent: If no, request that the patient contact the pharmacy for the refill.) (Agent: If yes, when and what did the pharmacy advise?)  Preferred Pharmacy (with phone number or street name): [place], [place]  Phone: [phone]  Fax: [phone]  Agent: Please be advised that RX refills may take up to 3 business days. We ask that you follow-up with your pharmacy.

## 2020-10-10 ENCOUNTER — Telehealth (INDEPENDENT_AMBULATORY_CARE_PROVIDER_SITE_OTHER): Payer: Self-pay | Admitting: Primary Care

## 2020-10-10 MED ORDER — TRULICITY 0.75 MG/0.5ML ~~LOC~~ SOAJ: 0.7500 mg | SUBCUTANEOUS | 0 refills | Status: DC

## 2020-10-10 MED ORDER — DULOXETINE HCL 60 MG PO CPEP: ORAL_CAPSULE | ORAL | 0 refills | Status: DC

## 2020-10-10 MED ORDER — AMLODIPINE BESYLATE 10 MG PO TABS: 10.0000 mg | ORAL_TABLET | Freq: Every day | ORAL | 0 refills | Status: DC

## 2020-10-10 NOTE — Telephone Encounter (Signed)
Kyle Novak from [facility], called in to see if patient can get short supply med , cymbalta until his appt on 10/15/19

## 2020-10-10 NOTE — Telephone Encounter (Signed)
Courtesy refill given until appointment 10/14/20.

## 2020-10-14 ENCOUNTER — Encounter (INDEPENDENT_AMBULATORY_CARE_PROVIDER_SITE_OTHER): Payer: Self-pay | Admitting: Primary Care

## 2020-10-14 ENCOUNTER — Other Ambulatory Visit: Payer: Self-pay

## 2020-10-14 ENCOUNTER — Ambulatory Visit (INDEPENDENT_AMBULATORY_CARE_PROVIDER_SITE_OTHER): Payer: Medicare Other | Admitting: Primary Care

## 2020-10-14 VITALS — BP 129/90 | HR 93 | Temp 97.3°F | Ht 69.0 in | Wt 183.0 lb

## 2020-10-14 DIAGNOSIS — R351 Nocturia: Secondary | ICD-10-CM

## 2020-10-14 DIAGNOSIS — G4700 Insomnia, unspecified: Secondary | ICD-10-CM

## 2020-10-14 DIAGNOSIS — E119 Type 2 diabetes mellitus without complications: Secondary | ICD-10-CM

## 2020-10-14 DIAGNOSIS — I10 Essential (primary) hypertension: Secondary | ICD-10-CM | POA: Diagnosis not present

## 2020-10-14 DIAGNOSIS — Z125 Encounter for screening for malignant neoplasm of prostate: Secondary | ICD-10-CM

## 2020-10-14 DIAGNOSIS — M25561 Pain in right knee: Secondary | ICD-10-CM

## 2020-10-14 DIAGNOSIS — F17209 Nicotine dependence, unspecified, with unspecified nicotine-induced disorders: Secondary | ICD-10-CM

## 2020-10-14 DIAGNOSIS — F17210 Nicotine dependence, cigarettes, uncomplicated: Secondary | ICD-10-CM

## 2020-10-14 DIAGNOSIS — G8929 Other chronic pain: Secondary | ICD-10-CM

## 2020-10-14 DIAGNOSIS — N529 Male erectile dysfunction, unspecified: Secondary | ICD-10-CM

## 2020-10-14 LAB — POCT GLYCOSYLATED HEMOGLOBIN (HGB A1C): Hemoglobin A1C: 7.4 % — AB (ref 4.0–5.6)

## 2020-10-14 MED ORDER — METFORMIN HCL 1000 MG PO TABS: 1000.0000 mg | ORAL_TABLET | Freq: Two times a day (BID) | ORAL | 3 refills | Status: DC

## 2020-10-14 MED ORDER — GABAPENTIN 600 MG PO TABS: 1200.0000 mg | ORAL_TABLET | Freq: Three times a day (TID) | ORAL | 1 refills | Status: DC

## 2020-10-14 MED ORDER — AMLODIPINE BESYLATE 10 MG PO TABS: 10.0000 mg | ORAL_TABLET | Freq: Every day | ORAL | 1 refills | Status: DC

## 2020-10-14 MED ORDER — DULOXETINE HCL 60 MG PO CPEP: ORAL_CAPSULE | ORAL | 1 refills | Status: DC

## 2020-10-14 MED ORDER — TRULICITY 0.75 MG/0.5ML ~~LOC~~ SOAJ: 0.7500 mg | SUBCUTANEOUS | 3 refills | Status: DC

## 2020-10-14 MED ORDER — TADALAFIL 10 MG PO TABS: 10.0000 mg | ORAL_TABLET | ORAL | 1 refills | Status: DC | PRN

## 2020-10-14 NOTE — Progress Notes (Signed)
Subjective:  Patient ID: Kyle Novak, male    DOB: 1965-03-09  Age: 55 y.o. MRN: [id number]  CC: Diabetes, Hypertension, and Insomnia   HPI Mr. Kyle Novak is a 55 year old male weight within normal range presents for follow-up of diabetes. Patient does  check blood sugar at home  Compliant with meds - Yes Checking CBGs? Yes sometimes   Fasting avg - 120  Postprandial average -  Exercising regularly? - Yes Watching carbohydrate intake? - Yes Neuropathy ? - No Hypoglycemic events - No  - Recovers with :   Pertinent ROS:  Polyuria - No Polydipsia - No Vision problems - No  Medications as noted below. Taking them regularly without complication/adverse reaction being reported today.  Hypertension Denies shortness of breath, headaches, chest pain or lower extremity edema  History [name] has a past medical history of Diabetes mellitus without complication ([name]), GERD (gastroesophageal reflux disease), and Hypertension.   He has a past surgical history that includes No past surgeries; Wisdom tooth extraction; and Knee arthroscopy with medial menisectomy (Right, 11/23/2018).   His family history includes Heart failure in his brother; Lung cancer in his father.He reports that he has been smoking cigarettes. He has been smoking an average of .5 packs per day. He has never used smokeless tobacco. He reports current alcohol use. He reports that he does not use drugs.  Current Outpatient Medications on File Prior to Visit  Medication Sig Dispense Refill   albuterol (VENTOLIN HFA) 108 (90 Base) MCG/ACT inhaler Inhale 1-2 puffs into the lungs every 6 (six) hours as needed for wheezing or shortness of breath. 18 g 1   diclofenac Sodium (VOLTAREN) 1 % GEL Apply 2 g topically 4 (four) times daily. 150 g 1   losartan (COZAAR) 100 MG tablet TAKE ONE-HALF TABLET (50 MG TOTAL) BY MOUTH DAILY. 90 tablet 0   omeprazole (PRILOSEC) 20 MG capsule TAKE 1 CAPSULE (20 MG TOTAL) BY MOUTH DAILY.  90 capsule 1   Current Facility-Administered Medications on File Prior to Visit  Medication Dose Route Frequency Provider Last Rate Last Admin   acetaminophen (TYLENOL) tablet 650 mg  650 mg Oral Q4H PRN Lorriane Shire, MD        ROS Review of Systems  All other systems reviewed and are negative.  Objective:  BP 129/90 (BP Location: Right Arm, Patient Position: Sitting, Cuff Size: Normal)   Pulse 93   Temp (!) 97.3 F (36.3 C) (Temporal)   Ht _0  (1.753 m)   Wt 183 lb (83 kg)   SpO2 94%   BMI 27.02 kg/m   BP Readings from Last 3 Encounters:  10/14/20 129/90  03/19/20 135/87  08/16/19 120/84    Wt Readings from Last 3 Encounters:  10/14/20 183 lb (83 kg)  03/19/20 185 lb 6.4 oz (84.1 kg)  08/16/19 176 lb 3.2 oz (79.9 kg)    Physical Exam General: Vital signs reviewed.  Patient is well-developed and well-nourished male, in no acute distress and cooperative with exam.  Head: Normocephalic and atraumatic. Eyes: EOMI, conjunctivae normal, no scleral icterus.  Neck: Supple, trachea midline, normal ROM, no JVD, masses, thyromegaly, or carotid bruit present.  Cardiovascular: RRR, S1 normal, S2 normal, no murmurs, gallops, or rubs. Pulmonary/Chest: Clear to auscultation bilaterally, no wheezes, rales, or rhonchi. Abdominal: Soft, non-tender, non-distended, BS +, no masses, organomegaly, or guarding present.  Musculoskeletal: No joint deformities, erythema, or stiffness, ROM full and nontender. Extremities: No lower extremity edema bilaterally,  pulses  symmetric and intact bilaterally. No cyanosis or clubbing. Neurological: A&O x3, Strength is normal and symmetric bilaterally, Skin: Warm, dry and intact. No rashes or erythema. Psychiatric: Normal mood and affect. speech and behavior is normal. Cognition and memory are normal.   Lab Results  Component Value Date   HGBA1C 7.4 (A) 10/14/2020   HGBA1C 6.9 (A) 03/19/2020   HGBA1C 6.0 (A) 08/16/2019    Lab Results  Component  Value Date   WBC 10.6 (H) 07/22/2019   HGB 14.2 07/22/2019   HCT 45.2 07/22/2019   PLT 344 07/22/2019   GLUCOSE 185 (H) 07/22/2019   CHOL 147 09/15/2019   TRIG 70 09/15/2019   HDL 39 (L) 09/15/2019   LDLCALC 94 09/15/2019   ALT 30 09/22/2018   AST 18 09/22/2018   NA 139 07/22/2019   K 4.1 07/22/2019   CL 103 07/22/2019   CREATININE 0.77 07/22/2019   BUN 11 07/22/2019   CO2 23 07/22/2019   INR 1.0 02/07/2019   HGBA1C 7.4 (A) 10/14/2020     Assessment & Plan:   [name] was seen today for diabetes, hypertension and insomnia.  Diagnoses and all orders for this visit:  Diabetes mellitus without complication ([place]) -     HgB A1c 7.4 ( increased) ADA recommends the following therapeutic goals for glycemic control related to A1c measurements: Goal of therapy: Less than 6.5 hemoglobin A1c.  Reference clinical practice recommendations. Monitors foods  that are high in carbohydrates are the following rice, potatoes, breads, sugars, and pastas.  Reduction in the intake (eating) will assist in lowering your blood sugars.  -     Ambulatory referral to Ophthalmology -     CBC with Differential -     Lipid Panel -   Increased metformin from $RemoveBefore'500mg'[id number]$  daily to   metFORMIN (GLUCOPHAGE) 1000 MG tablet; Take 1 tablet (1,000 mg total) by mouth 2 (two) times daily with a meal. Continue Dulaglutide (TRULICITY) 0.45 WU/9.8JX SOPN; Inject 0.75 mg into the skin once a week.  Prostate cancer screening Nocturia increase urination at night. Risk factor AAM,  PSA  Primary hypertension Counseled on blood pressure goal of less than 130/80, low-sodium, DASH diet, medication compliance, 150 minutes of moderate intensity exercise per week. Discussed medication compliance, adverse effects.  AmLODipine (NORVASC) 10 MG tablet; Take 1 tablet (10 mg total) by mouth daily. TAKE 1 TABLET (10 MG TOTAL) BY MOUTH DAILY. OFFICE VISIT NEEDED FOR ADDITIONAL REFILLS -     CMP14+EGFR  Tobacco use disorder, continuous - I  have recommended complete cessation of tobacco use. I have discussed various options available for assistance with tobacco cessation including over the counter methods (Nicotine gum, patch and lozenges). We also discussed prescription options (Chantix, Nicotine Inhaler / Nasal Spray). The patient is not interested in pursuing any prescription tobacco cessation options at this time. - Patient declines at this time.   Insomnia, unspecified type -     DULoxetine (CYMBALTA) 60 MG capsule; TAKE 1 (ONE) CAPSULE (60 MG TOTAL) BY MOUTH DAILY. OFFICE VISIT NEEDED FOR ADDITIONAL REFILLS  Chronic pain of right knee -     gabapentin (NEURONTIN) 600 MG tablet; Take 2 tablets (1,200 mg total) by mouth 3 (three) times daily.  Erectile dysfunction, unspecified erectile dysfunction type Erectile dysfunction (ED) is the inability to get or keep an erection firm enough to have sexual intercourse. He has the desire but body is not responding. (CIALIS) 10 MG tablet; Take 1 tablet  Nocturia -  metFORMIN (GLUCOPHAGE) 1000 MG tablet; Take 1 tablet (1,000 mg total) by mouth 2 (two) times daily with a meal. -     PSA  Other orders -     tadalafil (CIALIS) 10 MG tablet; Take 1 tablet (10 mg total) by mouth every other day as needed for erectile dysfunction.    I have discontinued Elberta Fortis T. Neuharth's multivitamin with minerals, atorvastatin, metFORMIN, and traMADol. I am also having him start on metFORMIN and tadalafil. Additionally, I am having him maintain his diclofenac Sodium, albuterol, omeprazole, losartan, Trulicity, DULoxetine, amLODipine, and gabapentin.  Meds ordered this encounter  Medications   metFORMIN (GLUCOPHAGE) 1000 MG tablet    Sig: Take 1 tablet (1,000 mg total) by mouth 2 (two) times daily with a meal.    Dispense:  180 tablet    Refill:  3   Dulaglutide (TRULICITY) 2.81 VW/8.6LR SOPN    Sig: Inject 0.75 mg into the skin once a week.    Dispense:  0.5 mL    Refill:  3   DULoxetine  (CYMBALTA) 60 MG capsule    Sig: TAKE 1 (ONE) CAPSULE (60 MG TOTAL) BY MOUTH DAILY. OFFICE VISIT NEEDED FOR ADDITIONAL REFILLS    Dispense:  90 capsule    Refill:  1   amLODipine (NORVASC) 10 MG tablet    Sig: Take 1 tablet (10 mg total) by mouth daily. TAKE 1 TABLET (10 MG TOTAL) BY MOUTH DAILY. OFFICE VISIT NEEDED FOR ADDITIONAL REFILLS    Dispense:  90 tablet    Refill:  1   gabapentin (NEURONTIN) 600 MG tablet    Sig: Take 2 tablets (1,200 mg total) by mouth 3 (three) times daily.    Dispense:  540 tablet    Refill:  1   tadalafil (CIALIS) 10 MG tablet    Sig: Take 1 tablet (10 mg total) by mouth every other day as needed for erectile dysfunction.    Dispense:  10 tablet    Refill:  1     Follow-up:   Return in about 2 months (around 12/14/2020) for New medication f/u.  The above assessment and management plan was discussed with the patient. The patient verbalized understanding of and has agreed to the management plan. Patient is aware to call the clinic if symptoms fail to improve or worsen. Patient is aware when to return to the clinic for a follow-up visit. Patient educated on when it is appropriate to go to the emergency department.   Juluis Mire, NP-C

## 2020-10-14 NOTE — Patient Instructions (Signed)

## 2020-10-15 LAB — CBC WITH DIFFERENTIAL/PLATELET
Basophils Absolute: 0.1 10*3/uL (ref 0.0–0.2)
Basos: 1 %
EOS (ABSOLUTE): 0.1 10*3/uL (ref 0.0–0.4)
Eos: 1 %
Hematocrit: 45.5 % (ref 37.5–51.0)
Hemoglobin: 14.8 g/dL (ref 13.0–17.7)
Immature Grans (Abs): 0.1 10*3/uL (ref 0.0–0.1)
Immature Granulocytes: 1 %
Lymphocytes Absolute: 3.2 10*3/uL — ABNORMAL HIGH (ref 0.7–3.1)
Lymphs: 30 %
MCH: 28.8 pg (ref 26.6–33.0)
MCHC: 32.5 g/dL (ref 31.5–35.7)
MCV: 89 fL (ref 79–97)
Monocytes Absolute: 0.9 10*3/uL (ref 0.1–0.9)
Monocytes: 8 %
Neutrophils Absolute: 6.3 10*3/uL (ref 1.4–7.0)
Neutrophils: 59 %
Platelets: 365 10*3/uL (ref 150–450)
RBC: 5.14 x10E6/uL (ref 4.14–5.80)
RDW: 11.5 % — ABNORMAL LOW (ref 11.6–15.4)
WBC: 10.6 10*3/uL (ref 3.4–10.8)

## 2020-10-15 LAB — CMP14+EGFR
ALT: 37 IU/L (ref 0–44)
AST: 23 IU/L (ref 0–40)
Albumin/Globulin Ratio: 1.7 (ref 1.2–2.2)
Albumin: 4.4 g/dL (ref 3.8–4.9)
Alkaline Phosphatase: 152 IU/L — ABNORMAL HIGH (ref 44–121)
BUN/Creatinine Ratio: 19 (ref 9–20)
BUN: 16 mg/dL (ref 6–24)
Bilirubin Total: 0.3 mg/dL (ref 0.0–1.2)
CO2: 22 mmol/L (ref 20–29)
Calcium: 9.9 mg/dL (ref 8.7–10.2)
Chloride: 101 mmol/L (ref 96–106)
Creatinine, Ser: 0.84 mg/dL (ref 0.76–1.27)
Globulin, Total: 2.6 g/dL (ref 1.5–4.5)
Glucose: 245 mg/dL — ABNORMAL HIGH (ref 65–99)
Potassium: 4.3 mmol/L (ref 3.5–5.2)
Sodium: 138 mmol/L (ref 134–144)
Total Protein: 7 g/dL (ref 6.0–8.5)
eGFR: 104 mL/min/{1.73_m2} (ref >59)

## 2020-10-15 LAB — LIPID PANEL
Chol/HDL Ratio: 3.3 ratio (ref 0.0–5.0)
Cholesterol, Total: 149 mg/dL (ref 100–199)
HDL: 45 mg/dL (ref >39)
LDL Chol Calc (NIH): 81 mg/dL (ref 0–99)
Triglycerides: 132 mg/dL (ref 0–149)
VLDL Cholesterol Cal: 23 mg/dL (ref 5–40)

## 2020-10-15 LAB — PSA: Prostate Specific Ag, Serum: 1.4 ng/mL (ref 0.0–4.0)

## 2020-10-18 ENCOUNTER — Other Ambulatory Visit (INDEPENDENT_AMBULATORY_CARE_PROVIDER_SITE_OTHER): Payer: Self-pay | Admitting: Primary Care

## 2020-10-18 ENCOUNTER — Ambulatory Visit (INDEPENDENT_AMBULATORY_CARE_PROVIDER_SITE_OTHER): Payer: Medicaid Other | Admitting: Primary Care

## 2020-10-18 DIAGNOSIS — E119 Type 2 diabetes mellitus without complications: Secondary | ICD-10-CM

## 2020-10-18 MED ORDER — TRULICITY 0.75 MG/0.5ML ~~LOC~~ SOAJ: 0.7500 mg | SUBCUTANEOUS | 3 refills | Status: DC

## 2020-10-18 NOTE — Telephone Encounter (Signed)
Medication: Dulaglutide (TRULICITY) [id number] SOPN [id number]   Can this be resent please? The pharmacy did not receive it Apt 10/14/20  Has the patient contacted their pharmacy? YES  (Agent: If no, request that the patient contact the pharmacy for the refill.) (Agent: If yes, when and what did the pharmacy advise?)  Preferred Pharmacy (with phone number or street name): [place], [place] - [address]  [place], [place] [address]   Agent: Please be advised that RX refills may take up to 3 business days. We ask that you follow-up with your pharmacy.

## 2020-10-22 ENCOUNTER — Telehealth (INDEPENDENT_AMBULATORY_CARE_PROVIDER_SITE_OTHER): Payer: Self-pay

## 2020-10-22 NOTE — Telephone Encounter (Signed)
-----   Message from Kerin Perna, NP sent at 10/21/2020  7:23 PM EDT ----- Labs are normal. TWork on eating a low fat, heart healthy diet and participate in regular aerobic exercise program to control as well. Exercise at least  30 minutes per day-5 days per week. Avoid red meat. No fried foods. No junk foods, sodas, sugary foods or drinks, unhealthy snacking, alcohol or smoking.

## 2020-10-22 NOTE — Telephone Encounter (Signed)
Patient is aware of normal labs. Nat Christen, CMA

## 2020-11-15 ENCOUNTER — Ambulatory Visit (INDEPENDENT_AMBULATORY_CARE_PROVIDER_SITE_OTHER): Payer: Medicare Other

## 2020-11-15 DIAGNOSIS — Z0000 Encounter for general adult medical examination without abnormal findings: Secondary | ICD-10-CM | POA: Diagnosis not present

## 2020-11-17 NOTE — Progress Notes (Signed)
I connected with  Kyle Novak on 11/17/20 by a phone enabled telemedicine application and verified that I am speaking with the correct person using two identifiers.   I discussed the limitations of evaluation and management by telemedicine. The patient expressed understanding and agreed to proceed.  Subjective:   Kyle Novak is a 55 y.o. male who presents for an Initial Medicare Annual Wellness Visit.  Review of Systems    Defer to PCP Cardiac Risk Factors include: advanced age (>52men, >52 women);male gender     Objective:    There were no vitals filed for this visit. There is no height or weight on file to calculate BMI.  Advanced Directives 11/17/2020 02/14/2019 02/07/2019 01/11/2019 11/23/2018 11/16/2018 09/16/2017  Does Patient Have a Medical Advance Directive? No No No No No No No  Would patient like information on creating a medical advance directive? No - Patient declined Yes (ED - Information included in AVS) No - Patient declined No - Patient declined No - Patient declined No - Patient declined No - Patient declined    Current Medications (verified) Outpatient Encounter Medications as of 11/15/2020  Medication Sig   albuterol (VENTOLIN HFA) 108 (90 Base) MCG/ACT inhaler Inhale 1-2 puffs into the lungs every 6 (six) hours as needed for wheezing or shortness of breath.   amLODipine (NORVASC) 10 MG tablet Take 1 tablet (10 mg total) by mouth daily. TAKE 1 TABLET (10 MG TOTAL) BY MOUTH DAILY. OFFICE VISIT NEEDED FOR ADDITIONAL REFILLS   diclofenac Sodium (VOLTAREN) 1 % GEL Apply 2 g topically 4 (four) times daily.   Dulaglutide (TRULICITY) 3.82 NK/5.3ZJ SOPN Inject 0.75 mg into the skin once a week.   DULoxetine (CYMBALTA) 60 MG capsule TAKE 1 (ONE) CAPSULE (60 MG TOTAL) BY MOUTH DAILY. OFFICE VISIT NEEDED FOR ADDITIONAL REFILLS   gabapentin (NEURONTIN) 600 MG tablet Take 2 tablets (1,200 mg total) by mouth 3 (three) times daily.   losartan (COZAAR) 100 MG tablet TAKE  ONE-HALF TABLET (50 MG TOTAL) BY MOUTH DAILY.   metFORMIN (GLUCOPHAGE) 1000 MG tablet Take 1 tablet (1,000 mg total) by mouth 2 (two) times daily with a meal.   omeprazole (PRILOSEC) 20 MG capsule TAKE 1 CAPSULE (20 MG TOTAL) BY MOUTH DAILY.   tadalafil (CIALIS) 10 MG tablet Take 1 tablet (10 mg total) by mouth every other day as needed for erectile dysfunction.   Facility-Administered Encounter Medications as of 11/15/2020  Medication   acetaminophen (TYLENOL) tablet 650 mg    Allergies (verified) Patient has no known allergies.   History: Past Medical History:  Diagnosis Date   Diabetes mellitus without complication (HCC)    GERD (gastroesophageal reflux disease)    Hypertension    Past Surgical History:  Procedure Laterality Date   KNEE ARTHROSCOPY WITH MEDIAL MENISECTOMY Right 11/23/2018   Procedure: RIGHT KNEE ARTHROSCOPY WITH PARTIAL MEDIAL MENISCECTOMY, SYNOVECTOMY;  Surgeon: Leandrew Koyanagi, MD;  Location: [place];  Service: Orthopedics;  Laterality: Right;   NO PAST SURGERIES     WISDOM TOOTH EXTRACTION     Family History  Problem Relation Age of Onset   Lung cancer Father    Heart failure Brother    Social History   Socioeconomic History   Marital status: Single    Spouse name: Not on file   Number of children: Not on file   Years of education: Not on file   Highest education level: Not on file  Occupational History   Not on file  Tobacco Use   Smoking status: Every Day    Packs/day: 0.50    Types: Cigarettes   Smokeless tobacco: Never  Substance and Sexual Activity   Alcohol use: Yes    Comment: OCCASIONAL   Drug use: No   Sexual activity: Not on file  Other Topics Concern   Not on file  Social History Narrative   Not on file   Social Determinants of Health   Financial Resource Strain: Low Risk    Difficulty of Paying Living Expenses: Not hard at all  Food Insecurity: No Food Insecurity   Worried About Charity fundraiser in the  Last Year: Never true   [place] in the Last Year: Never true  Transportation Needs: No Transportation Needs   Lack of Transportation (Medical): No   Lack of Transportation (Non-Medical): No  Physical Activity: Unknown   Days of Exercise per Week: Not on file   Minutes of Exercise per Session: 30 min  Stress: No Stress Concern Present   Feeling of Stress : Not at all  Social Connections: Unknown   Frequency of Communication with Friends and Family: More than three times a week   Frequency of Social Gatherings with Friends and Family: More than three times a week   Attends Religious Services: Never   Marine scientist or Organizations: No   Attends Music therapist: Never   Marital Status: Not on file    Tobacco Counseling Ready to quit: Not Answered Counseling given: Not Answered   Clinical Intake:     Pain : No/denies pain     Diabetes: Yes CBG done?: No Did pt. bring in CBG monitor from home?: No  How often do you need to have someone help you when you read instructions, pamphlets, or other written materials from your doctor or pharmacy?: 1 - Never  Diabetic?yes  Interpreter Needed?: No      Activities of Daily Living In your present state of health, do you have any difficulty performing the following activities: 11/17/2020  Hearing? N  Vision? N  Difficulty concentrating or making decisions? N  Walking or climbing stairs? N  Dressing or bathing? N  Doing errands, shopping? N  Preparing Food and eating ? N  Using the Toilet? N  In the past six months, have you accidently leaked urine? N  Do you have problems with loss of bowel control? N  Managing your Medications? N  Managing your Finances? N  Housekeeping or managing your Housekeeping? N  Some recent data might be hidden    Patient Care Team: Kerin Perna, NP as PCP - General (Internal Medicine)  Indicate any recent Medical Services you may have received from other  than [facility] providers in the past year (date may be approximate).     Assessment:   This is a routine wellness examination for Kyle Novak.  Hearing/Vision screen Hearing Screening - Comments:: Passed whisper test  Dietary issues and exercise activities discussed: Current Exercise Habits: The patient does not participate in regular exercise at present, Intensity: Mild   Goals Addressed   None   Depression Screen PHQ 2/9 Scores 10/14/2020 03/19/2020 08/16/2019 03/30/2019 12/27/2018 09/20/2018 03/30/2018  PHQ - 2 Score 0 0 0 0 0 0 0  PHQ- 9 Score - - - - - - -    Fall Risk Fall Risk  11/17/2020 10/14/2020 03/19/2020 08/16/2019 03/30/2019  Falls in the past year? 0 0 0 0 0  Number falls in past  yr: 0 - - - -  Injury with Fall? 0 - - - -  Risk for fall due to : No Fall Risks - - - -  Follow up Falls evaluation completed - - - -    FALL RISK PREVENTION PERTAINING TO THE HOME:  Any stairs in or around the home? No  If so, are there any without handrails? No  Home free of loose throw rugs in walkways, pet beds, electrical cords, etc? Yes  Adequate lighting in your home to reduce risk of falls? Yes   ASSISTIVE DEVICES UTILIZED TO PREVENT FALLS:  Life alert? No  Use of a cane, walker or w/c? No  Grab bars in the bathroom? No  Shower chair or bench in shower? No  Elevated toilet seat or a handicapped toilet? No   TIMED UP AND GO:  Was the test performed? No .  Length of time to ambulate 10 feet: 0 sec.   Gait slow and steady with assistive device  Cognitive Function:     6CIT Screen 11/17/2020  What Year? 0 points  What month? 0 points  What time? 0 points  Count back from 20 0 points  Months in reverse 0 points  Repeat phrase 0 points  Total Score 0    Immunizations Immunization History  Administered Date(s) Administered   PFIZER(Purple Top)SARS-COV-2 Vaccination 04/27/2019, 05/18/2019   Pneumococcal Polysaccharide-23 03/30/2018   Zoster, Live 04/06/2018    TDAP status: Due,  Education has been provided regarding the importance of this vaccine. Advised may receive this vaccine at local pharmacy or Health Dept. Aware to provide a copy of the vaccination record if obtained from local pharmacy or Health Dept. Verbalized acceptance and understanding.  Flu Vaccine status: Due, Education has been provided regarding the importance of this vaccine. Advised may receive this vaccine at local pharmacy or Health Dept. Aware to provide a copy of the vaccination record if obtained from local pharmacy or Health Dept. Verbalized acceptance and understanding.  Pneumococcal vaccine status: Due, Education has been provided regarding the importance of this vaccine. Advised may receive this vaccine at local pharmacy or Health Dept. Aware to provide a copy of the vaccination record if obtained from local pharmacy or Health Dept. Verbalized acceptance and understanding.  Covid-19 vaccine status: Information provided on how to obtain vaccines.   Qualifies for Shingles Vaccine? Yes   Zostavax completed Yes   Shingrix Completed?: No.    Education has been provided regarding the importance of this vaccine. Patient has been advised to call insurance company to determine out of pocket expense if they have not yet received this vaccine. Advised may also receive vaccine at local pharmacy or Health Dept. Verbalized acceptance and understanding.  Screening Tests Health Maintenance  Topic Date Due   OPHTHALMOLOGY EXAM  Never done   Zoster Vaccines- Shingrix (1 of 2) Never done   COVID-19 Vaccine (3 - Pfizer risk series) 06/15/2019   FOOT EXAM  03/29/2020   INFLUENZA VACCINE  09/16/2020   HEMOGLOBIN A1C  04/15/2021   TETANUS/TDAP  11/11/2022   COLONOSCOPY (Pts 45-29yrs Insurance coverage will need to be confirmed)  04/06/2028   Hepatitis C Screening  Completed   HIV Screening  Completed   HPV VACCINES  Aged Out    Health Maintenance  Health Maintenance Due  Topic Date Due   OPHTHALMOLOGY  EXAM  Never done   Zoster Vaccines- Shingrix (1 of 2) Never done   COVID-19 Vaccine (3 - Pfizer risk series) 06/15/2019  FOOT EXAM  03/29/2020   INFLUENZA VACCINE  09/16/2020    Colorectal cancer screening: Type of screening: Colonoscopy. Completed 2020. Repeat every 5 years  Lung Cancer Screening: (Low Dose CT Chest recommended if Age 71-80 years, 30 pack-year currently smoking OR have quit w/in 15years.) does qualify.   Lung Cancer Screening Referral: defer to pcp  Additional Screening:  Hepatitis C Screening: does qualify; Completed 03/17/2017  Vision Screening: Recommended annual ophthalmology exams for early detection of glaucoma and other disorders of the eye. Is the patient up to date with their annual eye exam?  Yes  Who is the provider or what is the name of the office in which the patient attends annual eye exams? [facility]  If pt is not established with a provider, would they like to be referred to a provider to establish care? No .   Dental Screening: Recommended annual dental exams for proper oral hygiene  Community Resource Referral / Chronic Care Management: CRR required this visit?  No   CCM required this visit?  No      Plan:     I have personally reviewed and noted the following in the patient's chart:   Medical and social history Use of alcohol, tobacco or illicit drugs  Current medications and supplements including opioid prescriptions. Patient is not currently taking opioid prescriptions. Functional ability and status Nutritional status Physical activity Advanced directives List of other physicians Hospitalizations, surgeries, and ER visits in previous 12 months Vitals Screenings to include cognitive, depression, and falls Referrals and appointments  In addition, I have reviewed and discussed with patient certain preventive protocols, quality metrics, and best practice recommendations. A written personalized care plan for preventive services as well  as general preventive health recommendations were provided to patient.     Stephan Minister, [place]   11/17/2020   Nurse Notes: [facility]

## 2020-11-17 NOTE — Patient Instructions (Addendum)
Colonoscopy, Adult A colonoscopy is a procedure to look at the entire large intestine. This procedure is done using a long, thin, flexible tube that has a camera on the end. You may have a colonoscopy: As a part of normal colorectal screening. If you have certain symptoms, such as: A low number of red blood cells in your blood (anemia). Diarrhea that does not go away. Pain in your abdomen. Blood in your stool. A colonoscopy can help screen for and diagnose medical problems, including: Tumors. Extra tissue that grows where mucus forms (polyps). Inflammation. Areas of bleeding. Tell your health care provider about: Any allergies you have. All medicines you are taking, including vitamins, herbs, eye drops, creams, and over-the-counter medicines. Any problems you or family members have had with anesthetic medicines. Any blood disorders you have. Any surgeries you have had. Any medical conditions you have. Any problems you have had with having bowel movements. Whether you are pregnant or may be pregnant. What are the risks? Generally, this is a safe procedure. However, problems may occur, including: Bleeding. Damage to your intestine. Allergic reactions to medicines given during the procedure. Infection. This is rare. What happens before the procedure? Eating and drinking restrictions Follow instructions from your health care provider about eating or drinking restrictions, which may include: A few days before the procedure: Follow a low-fiber diet. Avoid nuts, seeds, dried fruit, raw fruits, and vegetables. 1-3 days before the procedure: Eat only gelatin dessert or ice pops. Drink only clear liquids, such as water, clear juice, clear broth or bouillon, black coffee or tea, or clear soft drinks or sports drinks. Avoid liquids that contain red or purple dye. The day of the procedure: Do not eat solid foods. You may continue to drink clear liquids until up to 2 hours before the  procedure. Do not eat or drink anything starting 2 hours before the procedure, or within the time period that your health care provider recommends. Bowel prep If you were prescribed a bowel prep to take by mouth (orally) to clean out your colon: Take it as told by your health care provider. Starting the day before your procedure, you will need to drink a large amount of liquid medicine. The liquid will cause you to have many bowel movements of loose stool until your stool becomes almost clear or light green. If your skin or the opening between the buttocks (anus) gets irritated from diarrhea, you may relieve the irritation using: Wipes with medicine in them, such as adult wet wipes with aloe and vitamin E. A product to soothe skin, such as petroleum jelly. If you vomit while drinking the bowel prep: Take a break for up to 60 minutes. Begin the bowel prep again. Call your health care provider if you keep vomiting or you cannot take the bowel prep without vomiting. To clean out your colon, you may also be given: Laxative medicines. These help you have a bowel movement. Instructions for enema use. An enema is liquid medicine injected into your rectum. Medicines Ask your health care provider about: Changing or stopping your regular medicines or supplements. This is especially important if you are taking iron supplements, diabetes medicines, or blood thinners. Taking medicines such as aspirin and ibuprofen. These medicines can thin your blood. Do not take these medicines unless your health care provider tells you to take them. Taking over-the-counter medicines, vitamins, herbs, and supplements. General instructions Ask your health care provider what steps will be taken to help prevent infection. These may include  washing skin with a germ-killing soap. Plan to have someone take you home from the hospital or clinic. What happens during the procedure?  An IV will be inserted into one of your  veins. You may be given one or more of the following: A medicine to help you relax (sedative). A medicine to numb the area (local anesthetic). A medicine to make you fall asleep (general anesthetic). This is rarely needed. You will lie on your side with your knees bent. The tube will: Have oil or gel put on it (be lubricated). Be inserted into your anus. Be gently eased through all parts of your large intestine. Air will be sent into your colon to keep it open. This may cause some pressure or cramping. Images will be taken with the camera and will appear on a screen. A small tissue sample may be removed to be looked at under a microscope (biopsy). The tissue may be sent to a lab for testing if any signs of problems are found. If small polyps are found, they may be removed and checked for cancer cells. When the procedure is finished, the tube will be removed. The procedure may vary among health care providers and hospitals. What happens after the procedure? Your blood pressure, heart rate, breathing rate, and blood oxygen level will be monitored until you leave the hospital or clinic. You may have a small amount of blood in your stool. You may pass gas and have mild cramping or bloating in your abdomen. This is caused by the air that was used to open your colon during the exam. Do not drive for 24 hours after the procedure. It is up to you to get the results of your procedure. Ask your health care provider, or the department that is doing the procedure, when your results will be ready. Summary A colonoscopy is a procedure to look at the entire large intestine. Follow instructions from your health care provider about eating and drinking before the procedure. If you were prescribed an oral bowel prep to clean out your colon, take it as told by your health care provider. During the colonoscopy, a flexible tube with a camera on its end is inserted into the anus and then passed into the other  parts of the large intestine. This information is not intended to replace advice given to you by your health care provider. Make sure you discuss any questions you have with your health care provider. Document Revised: 08/26/2018 Document Reviewed: 08/26/2018 Elsevier Patient Education  [place] Prevention in the Home, Adult Falls can cause injuries and can happen to people of all ages. There are many things you can do to make your home safe and to help prevent falls. Ask for help when making these changes. What actions can I take to prevent falls? General Instructions Use good lighting in all rooms. Replace any light bulbs that burn out. Turn on the lights in dark areas. Use night-lights. Keep items that you use often in easy-to-reach places. Lower the shelves around your home if needed. Set up your furniture so you have a clear path. Avoid moving your furniture around. Do not have throw rugs or other things on the floor that can make you trip. Avoid walking on wet floors. If any of your floors are uneven, fix them. Add color or contrast paint or tape to clearly mark and help you see: Grab bars or handrails. First and last steps of staircases. Where the edge of each step  is. If you use a stepladder: Make sure that it is fully opened. Do not climb a closed stepladder. Make sure the sides of the stepladder are locked in place. Ask someone to hold the stepladder while you use it. Know where your pets are when moving through your home. What can I do in the bathroom?   Keep the floor dry. Clean up any water on the floor right away. Remove soap buildup in the tub or shower. Use nonskid mats or decals on the floor of the tub or shower. Attach bath mats securely with double-sided, nonslip rug tape. If you need to sit down in the shower, use a plastic, nonslip stool. Install grab bars by the toilet and in the tub and shower. Do not use towel bars as grab bars. What can I do  in the bedroom? Make sure that you have a light by your bed that is easy to reach. Do not use any sheets or blankets for your bed that hang to the floor. Have a firm chair with side arms that you can use for support when you get dressed. What can I do in the kitchen? Clean up any spills right away. If you need to reach something above you, use a step stool with a grab bar. Keep electrical cords out of the way. Do not use floor polish or wax that makes floors slippery. What can I do with my stairs? Do not leave any items on the stairs. Make sure that you have a light switch at the top and the bottom of the stairs. Make sure that there are handrails on both sides of the stairs. Fix handrails that are broken or loose. Install nonslip stair treads on all your stairs. Avoid having throw rugs at the top or bottom of the stairs. Choose a carpet that does not hide the edge of the steps on the stairs. Check carpeting to make sure that it is firmly attached to the stairs. Fix carpet that is loose or worn. What can I do on the outside of my home? Use bright outdoor lighting. Fix the edges of walkways and driveways and fix any cracks. Remove anything that might make you trip as you walk through a door, such as a raised step or threshold. Trim any bushes or trees on paths to your home. Check to see if handrails are loose or broken and that both sides of all steps have handrails. Install guardrails along the edges of any raised decks and porches. Clear paths of anything that can make you trip, such as tools or rocks. Have leaves, snow, or ice cleared regularly. Use sand or salt on paths during winter. Clean up any spills in your garage right away. This includes grease or oil spills. What other actions can I take? Wear shoes that: Have a low heel. Do not wear high heels. Have rubber bottoms. Feel good on your feet and fit well. Are closed at the toe. Do not wear open-toe sandals. Use tools that  help you move around if needed. These include: Canes. Walkers. Scooters. Crutches. Review your medicines with your doctor. Some medicines can make you feel dizzy. This can increase your chance of falling. Ask your doctor what else you can do to help prevent falls. Where to find more information Centers for Disease Control and Prevention, [place]: http://www.wolf.info/ National Institute on Aging: http://kim-miller.com/ Contact a doctor if: You are afraid of falling at home. You feel weak, drowsy, or dizzy at home. You fall at home.  Summary There are many simple things that you can do to make your home safe and to help prevent falls. Ways to make your home safe include removing things that can make you trip and installing grab bars in the bathroom. Ask for help when making these changes in your home. This information is not intended to replace advice given to you by your health care provider. Make sure you discuss any questions you have with your health care provider. Document Revised: 09/06/2019 Document Reviewed: 09/06/2019 Elsevier Patient Education  [place] Maintenance, Male Adopting a healthy lifestyle and getting preventive care are important in promoting health and wellness. Ask your health care provider about: The right schedule for you to have regular tests and exams. Things you can do on your own to prevent diseases and keep yourself healthy. What should I know about diet, weight, and exercise? Eat a healthy diet  Eat a diet that includes plenty of vegetables, fruits, low-fat dairy products, and lean protein. Do not eat a lot of foods that are high in solid fats, added sugars, or sodium. Maintain a healthy weight Body mass index (BMI) is a measurement that can be used to identify possible weight problems. It estimates body fat based on height and weight. Your health care provider can help determine your BMI and help you achieve or maintain a healthy weight. Get regular  exercise Get regular exercise. This is one of the most important things you can do for your health. Most adults should: Exercise for at least 150 minutes each week. The exercise should increase your heart rate and make you sweat (moderate-intensity exercise). Do strengthening exercises at least twice a week. This is in addition to the moderate-intensity exercise. Spend less time sitting. Even light physical activity can be beneficial. Watch cholesterol and blood lipids Have your blood tested for lipids and cholesterol at 55 years of age, then have this test every 5 years. You may need to have your cholesterol levels checked more often if: Your lipid or cholesterol levels are high. You are older than 55 years of age. You are at high risk for heart disease. What should I know about cancer screening? Many types of cancers can be detected early and may often be prevented. Depending on your health history and family history, you may need to have cancer screening at various ages. This may include screening for: Colorectal cancer. Prostate cancer. Skin cancer. Lung cancer. What should I know about heart disease, diabetes, and high blood pressure? Blood pressure and heart disease High blood pressure causes heart disease and increases the risk of stroke. This is more likely to develop in people who have high blood pressure readings, are of African descent, or are overweight. Talk with your health care provider about your target blood pressure readings. Have your blood pressure checked: Every 3-5 years if you are 5-2 years of age. Every year if you are 63 years old or older. If you are between the ages of 74 and 33 and are a current or former smoker, ask your health care provider if you should have a one-time screening for abdominal aortic aneurysm (AAA). Diabetes Have regular diabetes screenings. This checks your fasting blood sugar level. Have the screening done: Once every three years after age  64 if you are at a normal weight and have a low risk for diabetes. More often and at a younger age if you are overweight or have a high risk for diabetes. What should I know  about preventing infection? Hepatitis B If you have a higher risk for hepatitis B, you should be screened for this virus. Talk with your health care provider to find out if you are at risk for hepatitis B infection. Hepatitis C Blood testing is recommended for: Everyone born from [birth year] through 1965. Anyone with known risk factors for hepatitis C. Sexually transmitted infections (STIs) You should be screened each year for STIs, including gonorrhea and chlamydia, if: You are sexually active and are younger than 55 years of age. You are older than 55 years of age and your health care provider tells you that you are at risk for this type of infection. Your sexual activity has changed since you were last screened, and you are at increased risk for chlamydia or gonorrhea. Ask your health care provider if you are at risk. Ask your health care provider about whether you are at high risk for HIV. Your health care provider may recommend a prescription medicine to help prevent HIV infection. If you choose to take medicine to prevent HIV, you should first get tested for HIV. You should then be tested every 3 months for as long as you are taking the medicine. Follow these instructions at home: Lifestyle Do not use any products that contain nicotine or tobacco, such as cigarettes, e-cigarettes, and chewing tobacco. If you need help quitting, ask your health care provider. Do not use street drugs. Do not share needles. Ask your health care provider for help if you need support or information about quitting drugs. Alcohol use Do not drink alcohol if your health care provider tells you not to drink. If you drink alcohol: Limit how much you have to 0-2 drinks a day. Be aware of how much alcohol is in your drink. In the [place] one drink  equals one 12 oz bottle of beer (355 mL), one 5 oz glass of wine (148 mL), or one 1 oz glass of hard liquor (44 mL). General instructions Schedule regular health, dental, and eye exams. Stay current with your vaccines. Tell your health care provider if: You often feel depressed. You have ever been abused or do not feel safe at home. Summary Adopting a healthy lifestyle and getting preventive care are important in promoting health and wellness. Follow your health care provider's instructions about healthy diet, exercising, and getting tested or screened for diseases. Follow your health care provider's instructions on monitoring your cholesterol and blood pressure. This information is not intended to replace advice given to you by your health care provider. Make sure you discuss any questions you have with your health care provider. Document Revised: 04/12/2020 Document Reviewed: 01/26/2018 Elsevier Patient Education  2022 [place].  Colonoscopy, Adult A colonoscopy is a procedure to look at the entire large intestine. This procedure is done using a long, thin, flexible tube that has a camera on the end. You may have a colonoscopy: As a part of normal colorectal screening. If you have certain symptoms, such as: A low number of red blood cells in your blood (anemia). Diarrhea that does not go away. Pain in your abdomen. Blood in your stool. A colonoscopy can help screen for and diagnose medical problems, including: Tumors. Extra tissue that grows where mucus forms (polyps). Inflammation. Areas of bleeding. Tell your health care provider about: Any allergies you have. All medicines you are taking, including vitamins, herbs, eye drops, creams, and over-the-counter medicines. Any problems you or family members have had with anesthetic medicines. Any blood disorders you have.  Any surgeries you have had. Any medical conditions you have. Any problems you have had with having bowel  movements. Whether you are pregnant or may be pregnant. What are the risks? Generally, this is a safe procedure. However, problems may occur, including: Bleeding. Damage to your intestine. Allergic reactions to medicines given during the procedure. Infection. This is rare. What happens before the procedure? Eating and drinking restrictions Follow instructions from your health care provider about eating or drinking restrictions, which may include: A few days before the procedure: Follow a low-fiber diet. Avoid nuts, seeds, dried fruit, raw fruits, and vegetables. 1-3 days before the procedure: Eat only gelatin dessert or ice pops. Drink only clear liquids, such as water, clear juice, clear broth or bouillon, black coffee or tea, or clear soft drinks or sports drinks. Avoid liquids that contain red or purple dye. The day of the procedure: Do not eat solid foods. You may continue to drink clear liquids until up to 2 hours before the procedure. Do not eat or drink anything starting 2 hours before the procedure, or within the time period that your health care provider recommends. Bowel prep If you were prescribed a bowel prep to take by mouth (orally) to clean out your colon: Take it as told by your health care provider. Starting the day before your procedure, you will need to drink a large amount of liquid medicine. The liquid will cause you to have many bowel movements of loose stool until your stool becomes almost clear or light green. If your skin or the opening between the buttocks (anus) gets irritated from diarrhea, you may relieve the irritation using: Wipes with medicine in them, such as adult wet wipes with aloe and vitamin E. A product to soothe skin, such as petroleum jelly. If you vomit while drinking the bowel prep: Take a break for up to 60 minutes. Begin the bowel prep again. Call your health care provider if you keep vomiting or you cannot take the bowel prep without  vomiting. To clean out your colon, you may also be given: Laxative medicines. These help you have a bowel movement. Instructions for enema use. An enema is liquid medicine injected into your rectum. Medicines Ask your health care provider about: Changing or stopping your regular medicines or supplements. This is especially important if you are taking iron supplements, diabetes medicines, or blood thinners. Taking medicines such as aspirin and ibuprofen. These medicines can thin your blood. Do not take these medicines unless your health care provider tells you to take them. Taking over-the-counter medicines, vitamins, herbs, and supplements. General instructions Ask your health care provider what steps will be taken to help prevent infection. These may include washing skin with a germ-killing soap. Plan to have someone take you home from the hospital or clinic. What happens during the procedure?  An IV will be inserted into one of your veins. You may be given one or more of the following: A medicine to help you relax (sedative). A medicine to numb the area (local anesthetic). A medicine to make you fall asleep (general anesthetic). This is rarely needed. You will lie on your side with your knees bent. The tube will: Have oil or gel put on it (be lubricated). Be inserted into your anus. Be gently eased through all parts of your large intestine. Air will be sent into your colon to keep it open. This may cause some pressure or cramping. Images will be taken with the camera and will appear on  a screen. A small tissue sample may be removed to be looked at under a microscope (biopsy). The tissue may be sent to a lab for testing if any signs of problems are found. If small polyps are found, they may be removed and checked for cancer cells. When the procedure is finished, the tube will be removed. The procedure may vary among health care providers and hospitals. What happens after the  procedure? Your blood pressure, heart rate, breathing rate, and blood oxygen level will be monitored until you leave the hospital or clinic. You may have a small amount of blood in your stool. You may pass gas and have mild cramping or bloating in your abdomen. This is caused by the air that was used to open your colon during the exam. Do not drive for 24 hours after the procedure. It is up to you to get the results of your procedure. Ask your health care provider, or the department that is doing the procedure, when your results will be ready. Summary A colonoscopy is a procedure to look at the entire large intestine. Follow instructions from your health care provider about eating and drinking before the procedure. If you were prescribed an oral bowel prep to clean out your colon, take it as told by your health care provider. During the colonoscopy, a flexible tube with a camera on its end is inserted into the anus and then passed into the other parts of the large intestine. This information is not intended to replace advice given to you by your health care provider. Make sure you discuss any questions you have with your health care provider. Document Revised: 08/26/2018 Document Reviewed: 08/26/2018 Elsevier Patient Education  [place] Prevention in the Home, Adult Falls can cause injuries and can happen to people of all ages. There are many things you can do to make your home safe and to help prevent falls. Ask for help when making these changes. What actions can I take to prevent falls? General Instructions Use good lighting in all rooms. Replace any light bulbs that burn out. Turn on the lights in dark areas. Use night-lights. Keep items that you use often in easy-to-reach places. Lower the shelves around your home if needed. Set up your furniture so you have a clear path. Avoid moving your furniture around. Do not have throw rugs or other things on the floor that can make  you trip. Avoid walking on wet floors. If any of your floors are uneven, fix them. Add color or contrast paint or tape to clearly mark and help you see: Grab bars or handrails. First and last steps of staircases. Where the edge of each step is. If you use a stepladder: Make sure that it is fully opened. Do not climb a closed stepladder. Make sure the sides of the stepladder are locked in place. Ask someone to hold the stepladder while you use it. Know where your pets are when moving through your home. What can I do in the bathroom?   Keep the floor dry. Clean up any water on the floor right away. Remove soap buildup in the tub or shower. Use nonskid mats or decals on the floor of the tub or shower. Attach bath mats securely with double-sided, nonslip rug tape. If you need to sit down in the shower, use a plastic, nonslip stool. Install grab bars by the toilet and in the tub and shower. Do not use towel bars as grab bars. What can  I do in the bedroom? Make sure that you have a light by your bed that is easy to reach. Do not use any sheets or blankets for your bed that hang to the floor. Have a firm chair with side arms that you can use for support when you get dressed. What can I do in the kitchen? Clean up any spills right away. If you need to reach something above you, use a step stool with a grab bar. Keep electrical cords out of the way. Do not use floor polish or wax that makes floors slippery. What can I do with my stairs? Do not leave any items on the stairs. Make sure that you have a light switch at the top and the bottom of the stairs. Make sure that there are handrails on both sides of the stairs. Fix handrails that are broken or loose. Install nonslip stair treads on all your stairs. Avoid having throw rugs at the top or bottom of the stairs. Choose a carpet that does not hide the edge of the steps on the stairs. Check carpeting to make sure that it is firmly attached to  the stairs. Fix carpet that is loose or worn. What can I do on the outside of my home? Use bright outdoor lighting. Fix the edges of walkways and driveways and fix any cracks. Remove anything that might make you trip as you walk through a door, such as a raised step or threshold. Trim any bushes or trees on paths to your home. Check to see if handrails are loose or broken and that both sides of all steps have handrails. Install guardrails along the edges of any raised decks and porches. Clear paths of anything that can make you trip, such as tools or rocks. Have leaves, snow, or ice cleared regularly. Use sand or salt on paths during winter. Clean up any spills in your garage right away. This includes grease or oil spills. What other actions can I take? Wear shoes that: Have a low heel. Do not wear high heels. Have rubber bottoms. Feel good on your feet and fit well. Are closed at the toe. Do not wear open-toe sandals. Use tools that help you move around if needed. These include: Canes. Walkers. Scooters. Crutches. Review your medicines with your doctor. Some medicines can make you feel dizzy. This can increase your chance of falling. Ask your doctor what else you can do to help prevent falls. Where to find more information Centers for Disease Control and Prevention, [employer]: http://www.wolf.info/ National Institute on Aging: http://kim-miller.com/ Contact a doctor if: You are afraid of falling at home. You feel weak, drowsy, or dizzy at home. You fall at home. Summary There are many simple things that you can do to make your home safe and to help prevent falls. Ways to make your home safe include removing things that can make you trip and installing grab bars in the bathroom. Ask for help when making these changes in your home. This information is not intended to replace advice given to you by your health care provider. Make sure you discuss any questions you have with your health care  provider. Document Revised: 09/06/2019 Document Reviewed: 09/06/2019 Elsevier Patient Education  [place] Maintenance, Male Adopting a healthy lifestyle and getting preventive care are important in promoting health and wellness. Ask your health care provider about: The right schedule for you to have regular tests and exams. Things you can do on your own to prevent  diseases and keep yourself healthy. What should I know about diet, weight, and exercise? Eat a healthy diet  Eat a diet that includes plenty of vegetables, fruits, low-fat dairy products, and lean protein. Do not eat a lot of foods that are high in solid fats, added sugars, or sodium. Maintain a healthy weight Body mass index (BMI) is a measurement that can be used to identify possible weight problems. It estimates body fat based on height and weight. Your health care provider can help determine your BMI and help you achieve or maintain a healthy weight. Get regular exercise Get regular exercise. This is one of the most important things you can do for your health. Most adults should: Exercise for at least 150 minutes each week. The exercise should increase your heart rate and make you sweat (moderate-intensity exercise). Do strengthening exercises at least twice a week. This is in addition to the moderate-intensity exercise. Spend less time sitting. Even light physical activity can be beneficial. Watch cholesterol and blood lipids Have your blood tested for lipids and cholesterol at 55 years of age, then have this test every 5 years. You may need to have your cholesterol levels checked more often if: Your lipid or cholesterol levels are high. You are older than 55 years of age. You are at high risk for heart disease. What should I know about cancer screening? Many types of cancers can be detected early and may often be prevented. Depending on your health history and family history, you may need to have cancer  screening at various ages. This may include screening for: Colorectal cancer. Prostate cancer. Skin cancer. Lung cancer. What should I know about heart disease, diabetes, and high blood pressure? Blood pressure and heart disease High blood pressure causes heart disease and increases the risk of stroke. This is more likely to develop in people who have high blood pressure readings, are of African descent, or are overweight. Talk with your health care provider about your target blood pressure readings. Have your blood pressure checked: Every 3-5 years if you are 40-30 years of age. Every year if you are 51 years old or older. If you are between the ages of 41 and 64 and are a current or former smoker, ask your health care provider if you should have a one-time screening for abdominal aortic aneurysm (AAA). Diabetes Have regular diabetes screenings. This checks your fasting blood sugar level. Have the screening done: Once every three years after age 33 if you are at a normal weight and have a low risk for diabetes. More often and at a younger age if you are overweight or have a high risk for diabetes. What should I know about preventing infection? Hepatitis B If you have a higher risk for hepatitis B, you should be screened for this virus. Talk with your health care provider to find out if you are at risk for hepatitis B infection. Hepatitis C Blood testing is recommended for: Everyone born from [birth year] through 1965. Anyone with known risk factors for hepatitis C. Sexually transmitted infections (STIs) You should be screened each year for STIs, including gonorrhea and chlamydia, if: You are sexually active and are younger than 55 years of age. You are older than 55 years of age and your health care provider tells you that you are at risk for this type of infection. Your sexual activity has changed since you were last screened, and you are at increased risk for chlamydia or gonorrhea. Ask your  health care  provider if you are at risk. Ask your health care provider about whether you are at high risk for HIV. Your health care provider may recommend a prescription medicine to help prevent HIV infection. If you choose to take medicine to prevent HIV, you should first get tested for HIV. You should then be tested every 3 months for as long as you are taking the medicine. Follow these instructions at home: Lifestyle Do not use any products that contain nicotine or tobacco, such as cigarettes, e-cigarettes, and chewing tobacco. If you need help quitting, ask your health care provider. Do not use street drugs. Do not share needles. Ask your health care provider for help if you need support or information about quitting drugs. Alcohol use Do not drink alcohol if your health care provider tells you not to drink. If you drink alcohol: Limit how much you have to 0-2 drinks a day. Be aware of how much alcohol is in your drink. In the [place] one drink equals one 12 oz bottle of beer (355 mL), one 5 oz glass of wine (148 mL), or one 1 oz glass of hard liquor (44 mL). General instructions Schedule regular health, dental, and eye exams. Stay current with your vaccines. Tell your health care provider if: You often feel depressed. You have ever been abused or do not feel safe at home. Summary Adopting a healthy lifestyle and getting preventive care are important in promoting health and wellness. Follow your health care provider's instructions about healthy diet, exercising, and getting tested or screened for diseases. Follow your health care provider's instructions on monitoring your cholesterol and blood pressure. This information is not intended to replace advice given to you by your health care provider. Make sure you discuss any questions you have with your health care provider. Document Revised: 04/12/2020 Document Reviewed: 01/26/2018 Elsevier Patient Education  2022 [employer].

## 2020-12-30 ENCOUNTER — Other Ambulatory Visit (INDEPENDENT_AMBULATORY_CARE_PROVIDER_SITE_OTHER): Payer: Self-pay | Admitting: Primary Care

## 2020-12-30 DIAGNOSIS — I10 Essential (primary) hypertension: Secondary | ICD-10-CM

## 2020-12-30 NOTE — Telephone Encounter (Signed)
Sent to PCP ?

## 2021-01-16 ENCOUNTER — Other Ambulatory Visit (INDEPENDENT_AMBULATORY_CARE_PROVIDER_SITE_OTHER): Payer: Self-pay | Admitting: Primary Care

## 2021-01-16 DIAGNOSIS — E119 Type 2 diabetes mellitus without complications: Secondary | ICD-10-CM

## 2021-01-16 DIAGNOSIS — G4700 Insomnia, unspecified: Secondary | ICD-10-CM

## 2021-01-16 DIAGNOSIS — G8929 Other chronic pain: Secondary | ICD-10-CM

## 2021-01-16 DIAGNOSIS — I10 Essential (primary) hypertension: Secondary | ICD-10-CM

## 2021-01-16 NOTE — Telephone Encounter (Signed)
Sent to PCP ?

## 2021-06-06 ENCOUNTER — Other Ambulatory Visit (INDEPENDENT_AMBULATORY_CARE_PROVIDER_SITE_OTHER): Payer: Self-pay | Admitting: Primary Care

## 2021-06-06 DIAGNOSIS — E119 Type 2 diabetes mellitus without complications: Secondary | ICD-10-CM

## 2021-06-06 NOTE — Telephone Encounter (Signed)
Sent to PCP ?

## 2021-06-09 NOTE — Telephone Encounter (Signed)
Needs appt

## 2021-07-01 ENCOUNTER — Other Ambulatory Visit (INDEPENDENT_AMBULATORY_CARE_PROVIDER_SITE_OTHER): Payer: Self-pay | Admitting: Primary Care

## 2021-07-01 DIAGNOSIS — I10 Essential (primary) hypertension: Secondary | ICD-10-CM

## 2021-07-01 NOTE — Telephone Encounter (Signed)
Routed to PCP 

## 2021-07-19 ENCOUNTER — Other Ambulatory Visit (INDEPENDENT_AMBULATORY_CARE_PROVIDER_SITE_OTHER): Payer: Self-pay | Admitting: Primary Care

## 2021-07-19 DIAGNOSIS — G4700 Insomnia, unspecified: Secondary | ICD-10-CM

## 2021-07-19 DIAGNOSIS — I10 Essential (primary) hypertension: Secondary | ICD-10-CM

## 2021-07-21 NOTE — Telephone Encounter (Signed)
Called pt and made a follow up appointment.

## 2021-07-21 NOTE — Telephone Encounter (Signed)
Requested Prescriptions  Pending Prescriptions Disp Refills  . DULoxetine (CYMBALTA) 60 MG capsule [Pharmacy Med Name: DULOXETINE HCL 60 MG ORAL CAPSULE DELAYED RELEASE PARTICLES] 30 capsule 0    Sig: TAKE 1 (ONE) CAPSULE (60 MG TOTAL) BY MOUTH DAILY.     Psychiatry: Antidepressants - SNRI - duloxetine Failed - 07/19/2021 11:21 AM      Failed - Last BP in normal range    BP Readings from Last 1 Encounters:  10/14/20 129/90         Failed - Valid encounter within last 6 months    Recent Outpatient Visits          9 months ago Diabetes mellitus without complication ([place])   [place] Kerin Perna, NP   1 year ago Diabetes mellitus without complication ([place])   [place] Kerin Perna, NP   1 year ago Diabetes mellitus without complication ([place])   [place] Kerin Perna, NP   2 years ago Diabetes mellitus without complication ([place])   [place], [place], NP   2 years ago Diabetes mellitus without complication ([place])   [place], Michelle P, NP      Future Appointments            In 2 weeks Oletta Lamas, [place] Cage, NP [place] in normal range and within 360 days    Creatinine, Ser  Date Value Ref Range Status  10/14/2020 0.84 0.76 - 1.27 mg/dL Final         Passed - eGFR is 30 or above and within 360 days    GFR calc Af Amer  Date Value Ref Range Status  07/22/2019 >60 >60 mL/min Final   GFR calc non Af Amer  Date Value Ref Range Status  07/22/2019 >60 >60 mL/min Final   eGFR  Date Value Ref Range Status  10/14/2020 104 >59 mL/min/1.73 Final         Passed - Completed PHQ-2 or PHQ-9 in the last 360 days      . amLODipine (NORVASC) 10 MG tablet [Pharmacy Med Name: AMLODIPINE BESYLATE 10 MG ORAL TABLET] 30 tablet 0    Sig: TAKE 1 TABLET (10 MG TOTAL) BY MOUTH DAILY.      Cardiovascular: Calcium Channel Blockers 2 Failed - 07/19/2021 11:21 AM      Failed - Last BP in normal range    BP Readings from Last 1 Encounters:  10/14/20 129/90         Failed - Valid encounter within last 6 months    Recent Outpatient Visits          9 months ago Diabetes mellitus without complication ([place])   [place] [facility] Kerin Perna, NP   1 year ago Diabetes mellitus without complication ([place])   [place] Kerin Perna, NP   1 year ago Diabetes mellitus without complication ([place])   [place] Kerin Perna, NP   2 years ago Diabetes mellitus without complication ([place])   [place] Kerin Perna, NP   2 years ago Diabetes mellitus without complication ([place])   [place] Kerin Perna, NP      Future Appointments  In 2 weeks Kerin Perna, NP [facility]           Passed - Last Heart Rate in normal range    Pulse Readings from Last 1 Encounters:  10/14/20 93         . omeprazole (PRILOSEC) 20 MG capsule [Pharmacy Med Name: OMEPRAZOLE 20 MG ORAL CAPSULE DELAYED RELEASE] 90 capsule 0    Sig: TAKE 1 CAPSULE (20 MG TOTAL) BY MOUTH DAILY.     Gastroenterology: Proton Pump Inhibitors Passed - 07/19/2021 11:21 AM      Passed - Valid encounter within last 12 months    Recent Outpatient Visits          9 months ago Diabetes mellitus without complication ([place])   [place] [facility] Kerin Perna, NP   1 year ago Diabetes mellitus without complication ([place])   [place] Kerin Perna, NP   1 year ago Diabetes mellitus without complication ([place])   [place] Kerin Perna, NP   2 years ago Diabetes mellitus without complication ([place])   [place] Kerin Perna, NP   2 years  ago Diabetes mellitus without complication ([place])   [place], NP      Future Appointments            In 2 weeks Oletta Lamas [place] Cage, NP [place]

## 2021-08-01 ENCOUNTER — Other Ambulatory Visit (INDEPENDENT_AMBULATORY_CARE_PROVIDER_SITE_OTHER): Payer: Self-pay | Admitting: Primary Care

## 2021-08-01 DIAGNOSIS — E119 Type 2 diabetes mellitus without complications: Secondary | ICD-10-CM

## 2021-08-01 NOTE — Telephone Encounter (Signed)
Courtesy refill . appt in 3 days . Requested Prescriptions  Pending Prescriptions Disp Refills  . TRULICITY 0.86 PY/1.9JK SOPN [Pharmacy Med Name: TRULICITY 9.32 IZ/1.2WP SUBCUTANEOUS SOLUTION PEN-INJECTOR] 2 mL 0    Sig: INJECT 0.75 MG INTO THE SKIN ONCE A WEEK.     Endocrinology:  Diabetes - GLP-1 Receptor Agonists Failed - 08/01/2021  4:23 PM      Failed - HBA1C is between 0 and 7.9 and within 180 days    Hemoglobin A1C  Date Value Ref Range Status  10/14/2020 7.4 (A) 4.0 - 5.6 % Final   Hgb A1c MFr Bld  Date Value Ref Range Status  03/19/2017 7.6 (H) 4.8 - 5.6 % Final    Comment:    (NOTE) Pre diabetes:          5.7%-6.4% Diabetes:              >6.4% Glycemic control for   <7.0% adults with diabetes          Failed - Valid encounter within last 6 months    Recent Outpatient Visits          9 months ago Diabetes mellitus without complication ([place])   [place] [facility] Kerin Perna, NP   1 year ago Diabetes mellitus without complication ([place])   [place] [facility] Kerin Perna, NP   1 year ago Diabetes mellitus without complication ([place])   [place] [facility] Kerin Perna, NP   2 years ago Diabetes mellitus without complication ([place])   [facility] Kerin Perna, NP   2 years ago Diabetes mellitus without complication ([place])   [place], [place], NP      Future Appointments            In 3 days Kerin Perna, NP [place]

## 2021-08-04 ENCOUNTER — Ambulatory Visit (INDEPENDENT_AMBULATORY_CARE_PROVIDER_SITE_OTHER): Payer: Medicare Other | Admitting: Primary Care

## 2021-08-31 IMAGING — CR DG CHEST 2V
2 series · 2 of 2 positions shown · non-contrast
Comparison: Radiograph 08/15/2009

CLINICAL DATA: Chest pain, emesis and dry cough

EXAM:
CHEST - 2 VIEW

[chest pa]
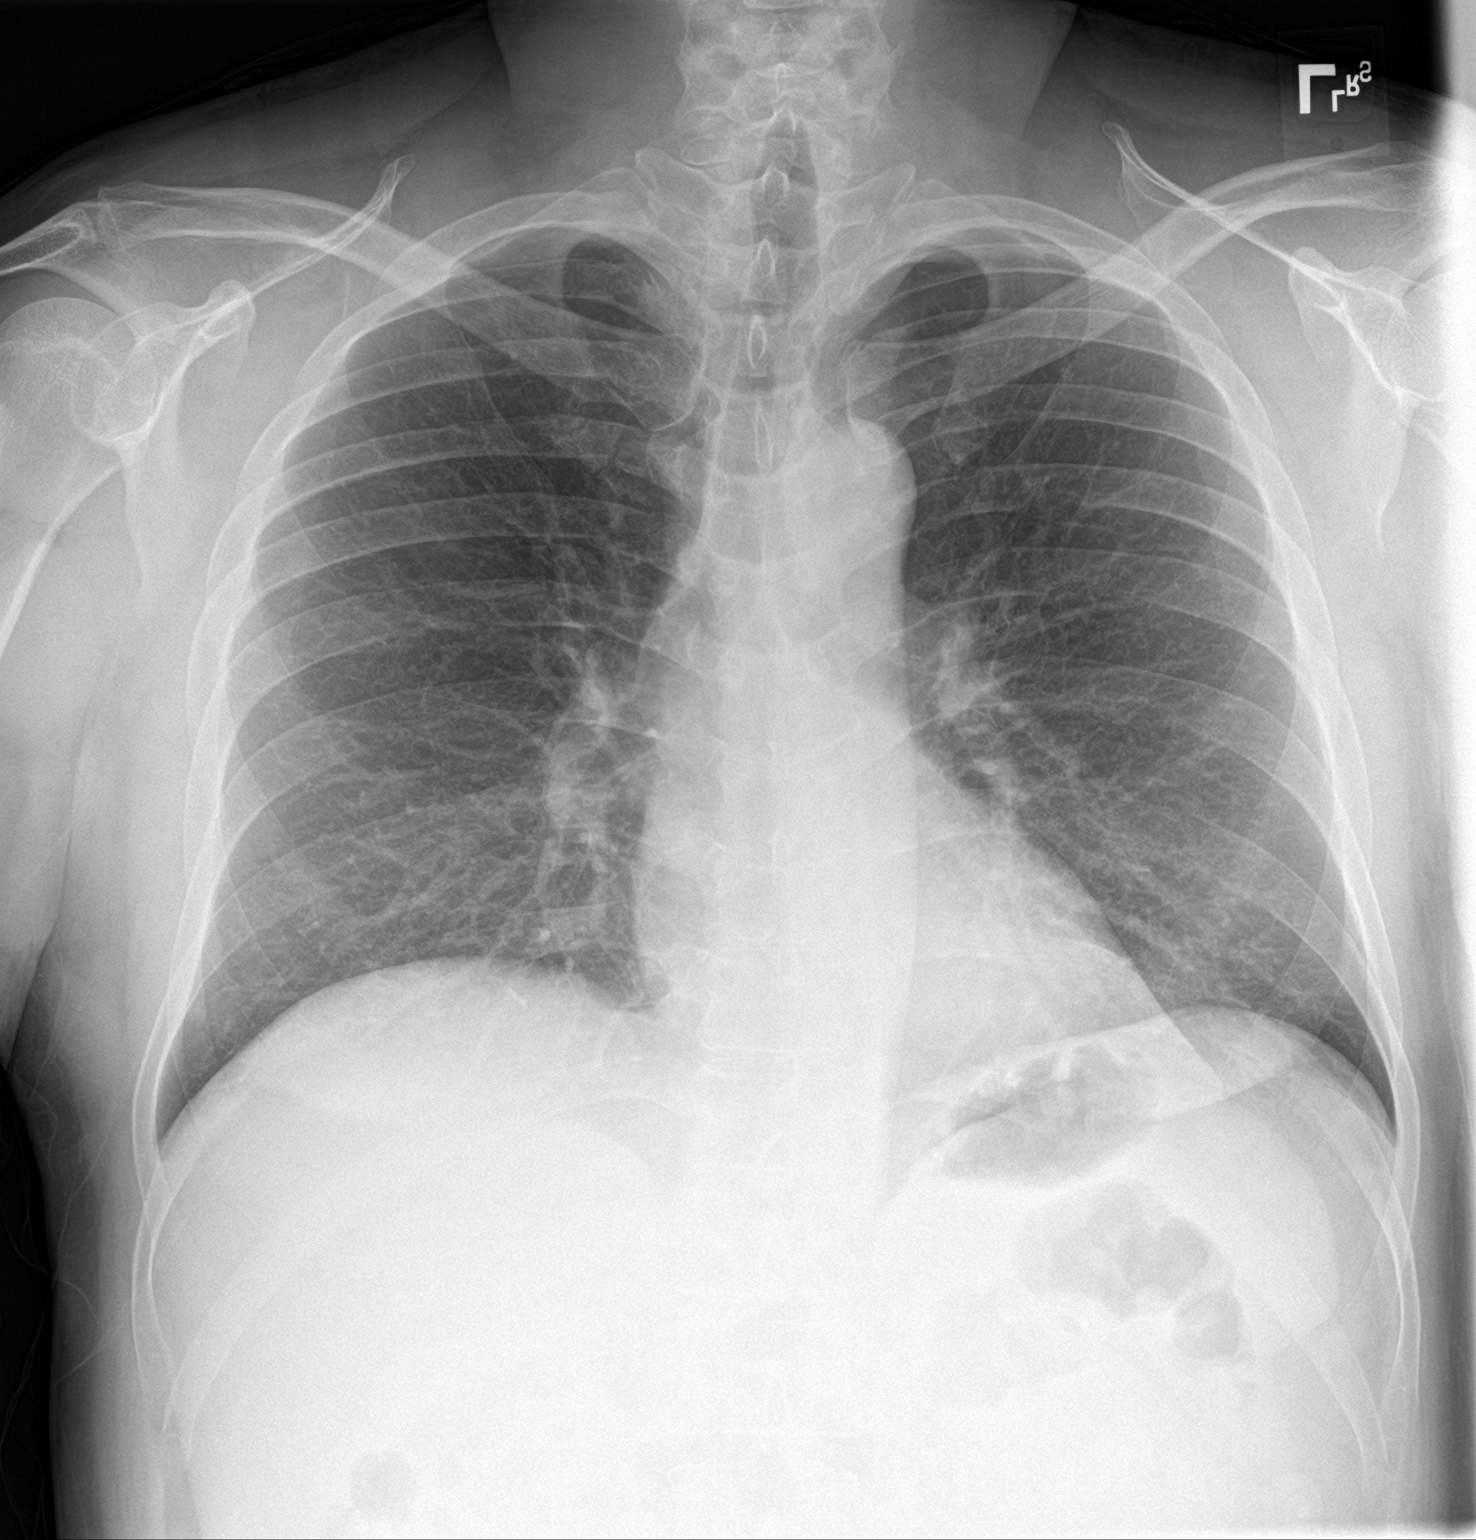

[chest lat]
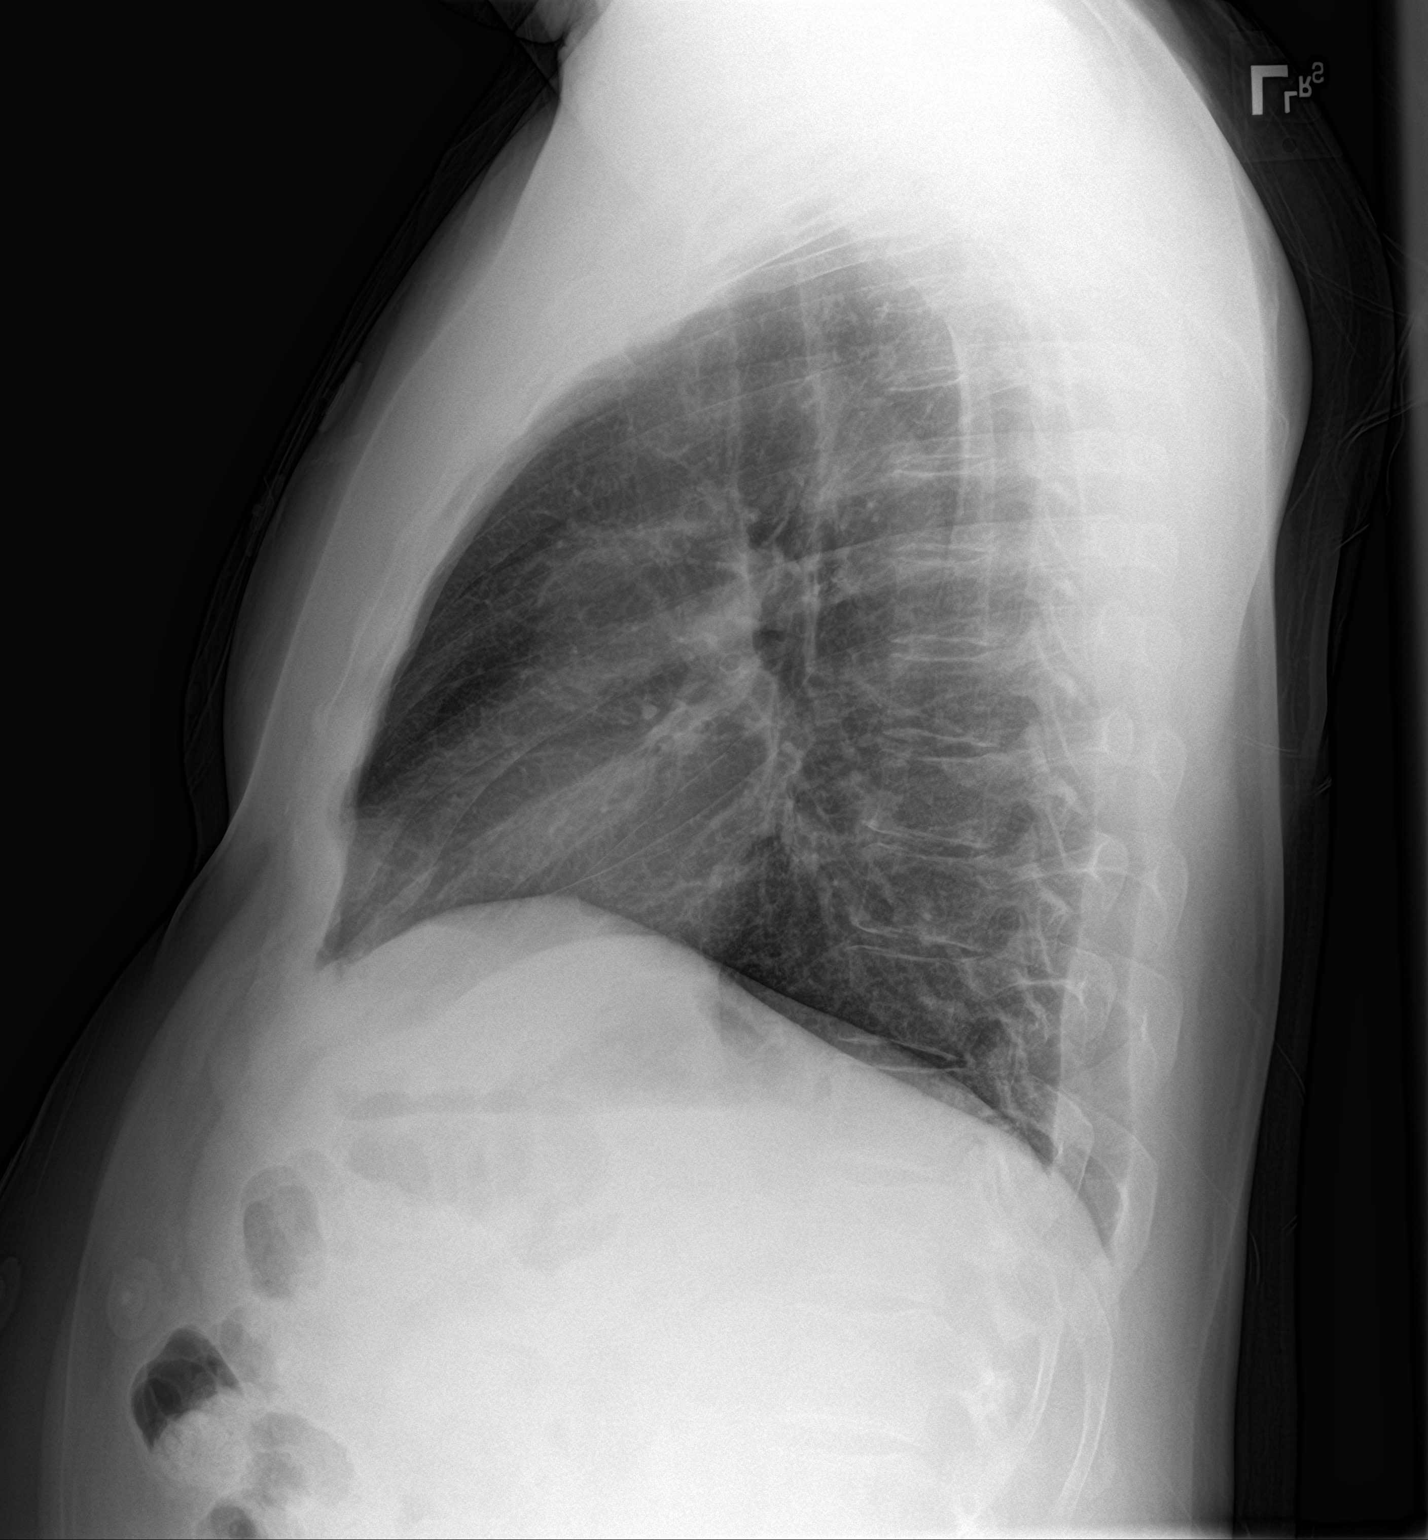

[2 of 2 positions shown; findings below may reference images not displayed]

FINDINGS: No consolidation, features of edema, pneumothorax, or effusion.
Pulmonary vascularity is normally distributed. The cardiomediastinal
contours are unremarkable. No acute osseous or soft tissue
abnormality.
IMPRESSION: No acute cardiopulmonary abnormality.

## 2021-09-01 ENCOUNTER — Encounter (INDEPENDENT_AMBULATORY_CARE_PROVIDER_SITE_OTHER): Payer: Self-pay | Admitting: Primary Care

## 2021-09-01 ENCOUNTER — Ambulatory Visit (INDEPENDENT_AMBULATORY_CARE_PROVIDER_SITE_OTHER): Payer: Medicare HMO | Admitting: Primary Care

## 2021-09-01 VITALS — BP 138/98 | HR 98 | Temp 97.9°F | Ht 69.0 in | Wt 179.8 lb

## 2021-09-01 DIAGNOSIS — I10 Essential (primary) hypertension: Secondary | ICD-10-CM | POA: Diagnosis not present

## 2021-09-01 DIAGNOSIS — G4700 Insomnia, unspecified: Secondary | ICD-10-CM

## 2021-09-01 DIAGNOSIS — E119 Type 2 diabetes mellitus without complications: Secondary | ICD-10-CM

## 2021-09-01 DIAGNOSIS — Z760 Encounter for issue of repeat prescription: Secondary | ICD-10-CM

## 2021-09-01 DIAGNOSIS — F17209 Nicotine dependence, unspecified, with unspecified nicotine-induced disorders: Secondary | ICD-10-CM | POA: Diagnosis not present

## 2021-09-01 LAB — POCT GLYCOSYLATED HEMOGLOBIN (HGB A1C): Hemoglobin A1C: 6.3 % — AB (ref 4.0–5.6)

## 2021-09-01 MED ORDER — DULOXETINE HCL 60 MG PO CPEP: ORAL_CAPSULE | ORAL | 1 refills | Status: DC

## 2021-09-01 MED ORDER — AMLODIPINE BESYLATE 10 MG PO TABS: 10.0000 mg | ORAL_TABLET | Freq: Every day | ORAL | 1 refills | Status: DC

## 2021-09-01 MED ORDER — METFORMIN HCL ER 500 MG PO TB24
500.0000 mg | ORAL_TABLET | Freq: Every day | ORAL | 1 refills | Status: DC
Start: 2021-09-01 — End: 2022-01-22

## 2021-09-01 MED ORDER — TADALAFIL 10 MG PO TABS
10.0000 mg | ORAL_TABLET | ORAL | 1 refills | Status: DC | PRN
Start: 2021-09-01 — End: 2022-10-07

## 2021-09-01 MED ORDER — LOSARTAN POTASSIUM 100 MG PO TABS: ORAL_TABLET | ORAL | 0 refills | Status: DC

## 2021-09-01 MED ORDER — ALBUTEROL SULFATE HFA 108 (90 BASE) MCG/ACT IN AERS: 1.0000 | INHALATION_SPRAY | Freq: Four times a day (QID) | RESPIRATORY_TRACT | 1 refills | Status: DC | PRN

## 2021-09-01 MED ORDER — TRULICITY 0.75 MG/0.5ML ~~LOC~~ SOAJ
0.7500 mg | SUBCUTANEOUS | 3 refills | Status: DC
Start: 2021-09-01 — End: 2021-12-16

## 2021-09-04 NOTE — Progress Notes (Signed)
Subjective:  Patient ID: Kyle Novak, male    DOB: 08-Jul-1965  Age: 56 y.o. MRN: [id number]  CC: Diabetes   HPI Kyle Novak presents forFollow-up of diabetes. Patient does not check blood sugar at home  Compliant with meds - Yes Checking CBGs? Yes  Fasting avg -   Postprandial average -  Exercising regularly? - Yes Watching carbohydrate intake? - Yes Neuropathy ? - No Hypoglycemic events - No  - Recovers with :   Pertinent ROS:  Polyuria - No Polydipsia - No Vision problems - No  Medications as noted below. Taking them regularly without complication/adverse reaction being reported today.   History [name] has a past medical history of Diabetes mellitus without complication ([place]), GERD (gastroesophageal reflux disease), and Hypertension.   He has a past surgical history that includes No past surgeries; Wisdom tooth extraction; and Knee arthroscopy with medial menisectomy (Right, 11/23/2018).   His family history includes Heart failure in his brother; Lung cancer in his father.He reports that he has been smoking cigarettes. He has been smoking an average of .5 packs per day. He has never used smokeless tobacco. He reports current alcohol use. He reports that he does not use drugs.  Current Outpatient Medications on File Prior to Visit  Medication Sig Dispense Refill   omeprazole (PRILOSEC) 20 MG capsule TAKE 1 CAPSULE (20 MG TOTAL) BY MOUTH DAILY. 90 capsule 0   diclofenac Sodium (VOLTAREN) 1 % GEL Apply 2 g topically 4 (four) times daily. 150 g 1   gabapentin (NEURONTIN) 600 MG tablet TAKE 2 TABLETS (1,200 MG TOTAL) BY MOUTH 3 (THREE) TIMES DAILY. 540 tablet 1   Current Facility-Administered Medications on File Prior to Visit  Medication Dose Route Frequency Provider Last Rate Last Admin   acetaminophen (TYLENOL) tablet 650 mg  650 mg Oral Q4H PRN Lorriane Shire, MD        ROS Review of Systems  Objective:  BP (!) 138/98   Pulse 98   Temp 97.9 F (36.6  C) (Oral)   Ht '5\' 9"'$  (1.753 m)   Wt 179 lb 12.8 oz (81.6 kg)   SpO2 97%   BMI 26.55 kg/m   BP Readings from Last 3 Encounters:  09/01/21 (!) 138/98  10/14/20 129/90  03/19/20 135/87    Wt Readings from Last 3 Encounters:  09/01/21 179 lb 12.8 oz (81.6 kg)  10/14/20 183 lb (83 kg)  03/19/20 185 lb 6.4 oz (84.1 kg)    Physical Exam  Lab Results  Component Value Date   HGBA1C 6.3 (A) 09/01/2021   HGBA1C 7.4 (A) 10/14/2020   HGBA1C 6.9 (A) 03/19/2020    Lab Results  Component Value Date   WBC 10.6 10/14/2020   HGB 14.8 10/14/2020   HCT 45.5 10/14/2020   PLT 365 10/14/2020   GLUCOSE 245 (H) 10/14/2020   CHOL 149 10/14/2020   TRIG 132 10/14/2020   HDL 45 10/14/2020   LDLCALC 81 10/14/2020   ALT 37 10/14/2020   AST 23 10/14/2020   NA 138 10/14/2020   K 4.3 10/14/2020   CL 101 10/14/2020   CREATININE 0.84 10/14/2020   BUN 16 10/14/2020   CO2 22 10/14/2020   INR 1.0 02/07/2019   HGBA1C 6.3 (A) 09/01/2021     Assessment & Plan:   Kyle Novak was seen today for diabetes.  Diagnoses and all orders for this visit:  Diabetes mellitus without complication (HCC) -     HgB A1c 6.3 well controlled . Continue  to monitor foods that are high in carbohydrates are the following rice, potatoes, breads, sugars, and pastas.  Reduction in the intake (eating) will assist in lowering your blood sugars.  -     Dulaglutide (TRULICITY) 6.31 SH/7.56YO SOPN; Inject 0.75 mg into the skin once a week.  Hypertension, unspecified type We have discussed target BP range and blood pressure goal. I have advised patient to check BP regularly and to call us back or report to clinic if the numbers are consistently higher than 140/90. We discussed the importance of compliance with medical therapy and DASH diet recommended, consequences of uncontrolled hypertension discussed.  - continue current BP medications  -     losartan (COZAAR) 100 MG tablet; TAKE ONE-HALF TABLET (50 MG TOTAL) BY MOUTH DAILY. -      amLODipine (NORVASC) 10 MG tablet; Take 1 tablet (10 mg total) by mouth daily.  Insomnia, unspecified type -     DULoxetine (CYMBALTA) 60 MG capsule; TAKE 1 (ONE) CAPSULE (60 MG TOTAL) BY MOUTH DAILY.  Tobacco use disorder, continuous - I have recommended complete cessation of tobacco use. I have discussed various options available for assistance with tobacco cessation including over the counter methods (Nicotine gum, patch and lozenges). We also discussed prescription options (Chantix, Nicotine Inhaler / Nasal Spray). The patient is not interested in pursuing any prescription tobacco cessation options at this time. - Patient declines at this time.  - Less than 5 minutes spent on counseling.  -     albuterol (VENTOLIN HFA) 108 (90 Base) MCG/ACT inhaler; Inhale 1-2 puffs into the lungs every 6 (six) hours as needed for wheezing or shortness of breath.  Other orders/Medication refill -     metFORMIN (GLUCOPHAGE-XR) 500 MG 24 hr tablet; Take 1 tablet (500 mg total) by mouth daily with breakfast. -     tadalafil (CIALIS) 10 MG tablet; Take 1 tablet (10 mg total) by mouth every other day as needed for erectile dysfunction.   I have discontinued Kyle Novak's metFORMIN. I have also changed his Trulicity. Additionally, I am having him start on metFORMIN. Lastly, I am having him maintain his diclofenac Sodium, gabapentin, omeprazole, losartan, amLODipine, DULoxetine, albuterol, and tadalafil.  Meds ordered this encounter  Medications   Dulaglutide (TRULICITY) 3.78 HY/8.5OY SOPN    Sig: Inject 0.75 mg into the skin once a week.    Dispense:  2 mL    Refill:  3    Order Specific Question:   Supervising Provider    Answer:   Tresa Garter [[id number]]   losartan (COZAAR) 100 MG tablet    Sig: TAKE ONE-HALF TABLET (50 MG TOTAL) BY MOUTH DAILY.    Dispense:  90 tablet    Refill:  0    Order Specific Question:   Supervising Provider    Answer:   Tresa Garter [[id number]]    amLODipine (NORVASC) 10 MG tablet    Sig: Take 1 tablet (10 mg total) by mouth daily.    Dispense:  90 tablet    Refill:  1    Order Specific Question:   Supervising Provider    Answer:   Tresa Garter [[id number]]   DULoxetine (CYMBALTA) 60 MG capsule    Sig: TAKE 1 (ONE) CAPSULE (60 MG TOTAL) BY MOUTH DAILY.    Dispense:  90 capsule    Refill:  1    Order Specific Question:   Supervising Provider    Answer:   Tresa Garter [id number]  albuterol (VENTOLIN HFA) 108 (90 Base) MCG/ACT inhaler    Sig: Inhale 1-2 puffs into the lungs every 6 (six) hours as needed for wheezing or shortness of breath.    Dispense:  18 g    Refill:  1    Order Specific Question:   Supervising Provider    Answer:   Tresa Garter [[id number]]   metFORMIN (GLUCOPHAGE-XR) 500 MG 24 hr tablet    Sig: Take 1 tablet (500 mg total) by mouth daily with breakfast.    Dispense:  90 tablet    Refill:  1    Order Specific Question:   Supervising Provider    Answer:   Tresa Garter [[id number]]   tadalafil (CIALIS) 10 MG tablet    Sig: Take 1 tablet (10 mg total) by mouth every other day as needed for erectile dysfunction.    Dispense:  10 tablet    Refill:  1    Order Specific Question:   Supervising Provider    Answer:   Tresa Garter [id number]     Follow-up:   Return in about 3 months (around 12/02/2021) for DM/HTN fasting.  The above assessment and management plan was discussed with the patient. The patient verbalized understanding of and has agreed to the management plan. Patient is aware to call the clinic if symptoms fail to improve or worsen. Patient is aware when to return to the clinic for a follow-up visit. Patient educated on when it is appropriate to go to the emergency department.   Juluis Mire, NP-C

## 2021-12-02 ENCOUNTER — Ambulatory Visit (INDEPENDENT_AMBULATORY_CARE_PROVIDER_SITE_OTHER): Payer: 59 | Admitting: Primary Care

## 2021-12-02 ENCOUNTER — Encounter (INDEPENDENT_AMBULATORY_CARE_PROVIDER_SITE_OTHER): Payer: Self-pay | Admitting: Primary Care

## 2021-12-02 VITALS — BP 132/87 | HR 93 | Temp 97.9°F | Ht 69.0 in | Wt 179.0 lb

## 2021-12-02 DIAGNOSIS — E119 Type 2 diabetes mellitus without complications: Secondary | ICD-10-CM

## 2021-12-02 DIAGNOSIS — I10 Essential (primary) hypertension: Secondary | ICD-10-CM

## 2021-12-02 LAB — POCT GLYCOSYLATED HEMOGLOBIN (HGB A1C): HbA1c, POC (controlled diabetic range): 6.2 % (ref 0.0–7.0)

## 2021-12-02 MED ORDER — LOSARTAN POTASSIUM 25 MG PO TABS: 25.0000 mg | ORAL_TABLET | Freq: Every day | ORAL | 1 refills | Status: DC

## 2021-12-02 NOTE — Progress Notes (Signed)
Subjective:  Patient ID: Kyle Novak, male    DOB: [birth year]  Age: 56 y.o. MRN: [id number]  CC: Diabetes   HPI IWAO SHAMBLIN presents for follow-up of diabetes. Patient does check blood sugar at home.  Management of hypertension-Patient has No headache, No chest pain, No abdominal pain - No Nausea, No new weakness tingling or numbness, No Cough - shortness of breath   Compliant with meds - Yes Checking CBGs? Yes  Fasting avg - 110-200  Postprandial average -  Exercising regularly? - Yes Watching carbohydrate intake? - Yes Neuropathy ? - No Hypoglycemic events - No  - Recovers with :   Pertinent ROS:  Polyuria - No Polydipsia - No Vision problems - No  Medications as noted below. Taking them regularly without complication/adverse reaction being reported today.   History Kamani has a past medical history of Diabetes mellitus without complication ([place]), GERD (gastroesophageal reflux disease), and Hypertension.   He has a past surgical history that includes No past surgeries; Wisdom tooth extraction; and Knee arthroscopy with medial menisectomy (Right, 11/23/2018).   His family history includes Heart failure in his brother; Lung cancer in his father.He reports that he has been smoking cigarettes. He has been smoking an average of .5 packs per day. He has never used smokeless tobacco. He reports current alcohol use. He reports that he does not use drugs.  Current Outpatient Medications on File Prior to Visit  Medication Sig Dispense Refill   albuterol (VENTOLIN HFA) 108 (90 Base) MCG/ACT inhaler Inhale 1-2 puffs into the lungs every 6 (six) hours as needed for wheezing or shortness of breath. 18 g 1   amLODipine (NORVASC) 10 MG tablet Take 1 tablet (10 mg total) by mouth daily. 90 tablet 1   diclofenac Sodium (VOLTAREN) 1 % GEL Apply 2 g topically 4 (four) times daily. 150 g 1   Dulaglutide (TRULICITY) 6.65 LD/3.5TS SOPN Inject 0.75 mg into the skin once a week. 2 mL 3    DULoxetine (CYMBALTA) 60 MG capsule TAKE 1 (ONE) CAPSULE (60 MG TOTAL) BY MOUTH DAILY. 90 capsule 1   gabapentin (NEURONTIN) 600 MG tablet TAKE 2 TABLETS (1,200 MG TOTAL) BY MOUTH 3 (THREE) TIMES DAILY. 540 tablet 1   losartan (COZAAR) 100 MG tablet TAKE ONE-HALF TABLET (50 MG TOTAL) BY MOUTH DAILY. 90 tablet 0   metFORMIN (GLUCOPHAGE-XR) 500 MG 24 hr tablet Take 1 tablet (500 mg total) by mouth daily with breakfast. 90 tablet 1   omeprazole (PRILOSEC) 20 MG capsule TAKE 1 CAPSULE (20 MG TOTAL) BY MOUTH DAILY. 90 capsule 0   tadalafil (CIALIS) 10 MG tablet Take 1 tablet (10 mg total) by mouth every other day as needed for erectile dysfunction. 10 tablet 1   Current Facility-Administered Medications on File Prior to Visit  Medication Dose Route Frequency Provider Last Rate Last Admin   acetaminophen (TYLENOL) tablet 650 mg  650 mg Oral Q4H PRN Lorriane Shire, MD        ROS Comprehensive ROS Pertinent positive and negative noted in HPI    Objective:  BP 132/87   Pulse 93   Temp 97.9 F (36.6 C) (Oral)   Ht '5\' 9"'$  (1.753 m)   Wt 179 lb (81.2 kg)   SpO2 97%   BMI 26.43 kg/m   BP Readings from Last 3 Encounters:  12/02/21 132/87  09/01/21 (!) 138/98  10/14/20 129/90    Wt Readings from Last 3 Encounters:  12/02/21 179 lb (81.2 kg)  09/01/21  179 lb 12.8 oz (81.6 kg)  10/14/20 183 lb (83 kg)   Physical exam: General: Vital signs reviewed.  Patient is well-developed and well-nourished, overweight no acute distress and cooperative with exam. Head: Normocephalic and atraumatic. Eyes: EOMI, conjunctivae normal, no scleral icterus. Neck: Supple, trachea midline, normal ROM, no JVD, masses, thyromegaly, or carotid bruit present. Cardiovascular: RRR, S1 normal, S2 normal, no murmurs, gallops, or rubs. Pulmonary/Chest: Clear to auscultation bilaterally, no wheezes, rales, or rhonchi. Abdominal: Soft, non-tender, non-distended, BS +, no masses, organomegaly, or guarding  present. Musculoskeletal: No joint deformities, erythema, or stiffness, ROM full and nontender. Extremities: No lower extremity edema bilaterally,  pulses symmetric and intact bilaterally. No cyanosis or clubbing. Neurological: A&O x3, Strength is normal Skin: Warm, dry and intact. No rashes or erythema. Psychiatric: Normal mood and affect. speech and behavior is normal. Cognition and memory are normal.    Lab Results  Component Value Date   HGBA1C 6.2 12/02/2021   HGBA1C 6.3 (A) 09/01/2021   HGBA1C 7.4 (A) 10/14/2020    Lab Results  Component Value Date   WBC 10.6 10/14/2020   HGB 14.8 10/14/2020   HCT 45.5 10/14/2020   PLT 365 10/14/2020   GLUCOSE 245 (H) 10/14/2020   CHOL 149 10/14/2020   TRIG 132 10/14/2020   HDL 45 10/14/2020   LDLCALC 81 10/14/2020   ALT 37 10/14/2020   AST 23 10/14/2020   NA 138 10/14/2020   K 4.3 10/14/2020   CL 101 10/14/2020   CREATININE 0.84 10/14/2020   BUN 16 10/14/2020   CO2 22 10/14/2020   INR 1.0 02/07/2019   HGBA1C 6.2 12/02/2021     Assessment & Plan:  Tierre was seen today for diabetes.  Diagnoses and all orders for this visit:  Diabetes mellitus without complication ([place]) -     POCT glycosylated hemoglobin (Hb A1C) -     Microalbumin, urine -     Ambulatory referral to Optometry  Hypertension, unspecified type BP goal - < 130/80 Explained that having normal blood pressure is the goal and medications are helping to get to goal and maintain normal blood pressure. DIET: Limit salt intake, read nutrition labels to check salt content, limit fried and high fatty foods  Avoid using multisymptom OTC cold preparations that generally contain sudafed which can rise BP. Consult with pharmacist on best cold relief products to use for persons with HTN EXERCISE Discussed incorporating exercise such as walking - 30 minutes most days of the week and can do in 10 minute intervals    -     losartan (COZAAR) 25 MG tablet; Take 1 tablet (25 mg  total) by mouth daily.  Comprehensive diabetic foot examination, type 2 DM, encounter for [facility]) completed   I am having Edmonia Caprio maintain his diclofenac Sodium, gabapentin, omeprazole, Trulicity, losartan, amLODipine, DULoxetine, albuterol, metFORMIN, and tadalafil.  No orders of the defined types were placed in this encounter.    Follow-up:   No follow-ups on file.  The above assessment and management plan was discussed with the patient. The patient verbalized understanding of and has agreed to the management plan. Patient is aware to call the clinic if symptoms fail to improve or worsen. Patient is aware when to return to the clinic for a follow-up visit. Patient educated on when it is appropriate to go to the emergency department.   Juluis Mire, NP-C

## 2021-12-03 LAB — MICROALBUMIN, URINE: Microalbumin, Urine: 15.6 ug/mL

## 2021-12-04 ENCOUNTER — Telehealth (INDEPENDENT_AMBULATORY_CARE_PROVIDER_SITE_OTHER): Payer: Self-pay

## 2021-12-04 NOTE — Telephone Encounter (Signed)
Contacted pt to go over lab results pt is aware and doesn't have any questions or concerns 

## 2021-12-16 ENCOUNTER — Other Ambulatory Visit (INDEPENDENT_AMBULATORY_CARE_PROVIDER_SITE_OTHER): Payer: Self-pay | Admitting: Primary Care

## 2021-12-16 DIAGNOSIS — G4700 Insomnia, unspecified: Secondary | ICD-10-CM

## 2021-12-16 DIAGNOSIS — E119 Type 2 diabetes mellitus without complications: Secondary | ICD-10-CM

## 2021-12-16 NOTE — Telephone Encounter (Signed)
Requested Prescriptions  Pending Prescriptions Disp Refills  . TRULICITY 1.89 QM/2.1IZ SOPN [Pharmacy Med Name: TRULICITY 1.28 FV/8.8QL SUBCUTANEOUS SOLUTION PEN-INJECTOR] 2 mL 2    Sig: INJECT 0.75 MG INTO THE SKIN ONCE A WEEK.     Endocrinology:  Diabetes - GLP-1 Receptor Agonists Passed - 12/16/2021 12:35 PM      Passed - HBA1C is between 0 and 7.9 and within 180 days    HbA1c, POC (controlled diabetic range)  Date Value Ref Range Status  12/02/2021 6.2 0.0 - 7.0 % Final         Passed - Valid encounter within last 6 months    Recent Outpatient Visits          2 weeks ago Diabetes mellitus without complication ([place])   [place] [facility] Juluis Mire P, NP   3 months ago Diabetes mellitus without complication ([place])   [place] Kerin Perna, NP   1 year ago Diabetes mellitus without complication ([place])   [place] Kerin Perna, NP   1 year ago Diabetes mellitus without complication ([place])   [place] Kerin Perna, NP   2 years ago Diabetes mellitus without complication ([place])   [place], [place], NP      Future Appointments            In 5 months Oletta Lamas, Milford Cage, NP [place]

## 2021-12-17 NOTE — Telephone Encounter (Signed)
Requested Prescriptions  Pending Prescriptions Disp Refills  . DULoxetine (CYMBALTA) 60 MG capsule [Pharmacy Med Name: DULOXETINE HCL 60 MG ORAL CAPSULE DELAYED RELEASE PARTICLES] 90 capsule 1    Sig: TAKE 1 (ONE) CAPSULE (60 MG TOTAL) BY MOUTH DAILY.     Psychiatry: Antidepressants - SNRI - duloxetine Failed - 12/16/2021  4:36 PM      Failed - Cr in normal range and within 360 days    Creatinine, Ser  Date Value Ref Range Status  10/14/2020 0.84 0.76 - 1.27 mg/dL Final         Failed - eGFR is 30 or above and within 360 days    GFR calc Af Amer  Date Value Ref Range Status  07/22/2019 >60 >60 mL/min Final   GFR calc non Af Amer  Date Value Ref Range Status  07/22/2019 >60 >60 mL/min Final   eGFR  Date Value Ref Range Status  10/14/2020 104 >59 mL/min/1.73 Final         Passed - Completed PHQ-2 or PHQ-9 in the last 360 days      Passed - Last BP in normal range    BP Readings from Last 1 Encounters:  12/02/21 132/87         Passed - Valid encounter within last 6 months    Recent Outpatient Visits          2 weeks ago Diabetes mellitus without complication ([place])   [place] [facility] Juluis Mire P, NP   3 months ago Diabetes mellitus without complication ([place])   [place] [facility] Kerin Perna, NP   1 year ago Diabetes mellitus without complication ([place])   [place] [facility] Kerin Perna, NP   1 year ago Diabetes mellitus without complication ([place])   [facility] Kerin Perna, NP   2 years ago Diabetes mellitus without complication ([place])   [place], [place], NP      Future Appointments            In 5 months Oletta Lamas, Milford Cage, NP [place]

## 2022-01-20 ENCOUNTER — Other Ambulatory Visit (INDEPENDENT_AMBULATORY_CARE_PROVIDER_SITE_OTHER): Payer: Self-pay | Admitting: Primary Care

## 2022-01-20 DIAGNOSIS — I10 Essential (primary) hypertension: Secondary | ICD-10-CM

## 2022-01-20 NOTE — Telephone Encounter (Signed)
Requested Prescriptions  Pending Prescriptions Disp Refills   metFORMIN (GLUCOPHAGE-XR) 500 MG 24 hr tablet [Pharmacy Med Name: METFORMIN HCL ER 500 MG ORAL TABLET EXTENDED RELEASE 24 HOUR] 90 tablet 1    Sig: TAKE 1 TABLET (500 MG TOTAL) BY MOUTH DAILY WITH BREAKFAST.     Endocrinology:  Diabetes - Biguanides Failed - 01/20/2022  2:47 PM      Failed - Cr in normal range and within 360 days    Creatinine, Ser  Date Value Ref Range Status  10/14/2020 0.84 0.76 - 1.27 mg/dL Final         Failed - eGFR in normal range and within 360 days    GFR calc Af Amer  Date Value Ref Range Status  07/22/2019 >60 >60 mL/min Final   GFR calc non Af Amer  Date Value Ref Range Status  07/22/2019 >60 >60 mL/min Final   eGFR  Date Value Ref Range Status  10/14/2020 104 >59 mL/min/1.73 Final         Failed - B12 Level in normal range and within 720 days    Vitamin B-12  Date Value Ref Range Status  10/11/2017 >2000 (H) 232 - 1245 pg/mL Final         Failed - CBC within normal limits and completed in the last 12 months    WBC  Date Value Ref Range Status  10/14/2020 10.6 3.4 - 10.8 x10E3/uL Final  07/22/2019 10.6 (H) 4.0 - 10.5 K/uL Final   RBC  Date Value Ref Range Status  10/14/2020 5.14 4.14 - 5.80 x10E6/uL Final  07/22/2019 5.03 4.22 - 5.81 MIL/uL Final   Hemoglobin  Date Value Ref Range Status  10/14/2020 14.8 13.0 - 17.7 g/dL Final   Hematocrit  Date Value Ref Range Status  10/14/2020 45.5 37.5 - 51.0 % Final   MCHC  Date Value Ref Range Status  10/14/2020 32.5 31.5 - 35.7 g/dL Final  07/22/2019 31.4 30.0 - 36.0 g/dL Final   [facility]  Date Value Ref Range Status  10/14/2020 28.8 26.6 - 33.0 pg Final  07/22/2019 28.2 26.0 - 34.0 pg Final   MCV  Date Value Ref Range Status  10/14/2020 89 79 - 97 fL Final   No results found for: "PLTCOUNTKUC", "LABPLAT", "POCPLA" RDW  Date Value Ref Range Status  10/14/2020 11.5 (L) 11.6 - 15.4 % Final         Passed - HBA1C is between  0 and 7.9 and within 180 days    HbA1c, POC (controlled diabetic range)  Date Value Ref Range Status  12/02/2021 6.2 0.0 - 7.0 % Final         Passed - Valid encounter within last 6 months    Recent Outpatient Visits           1 month ago Diabetes mellitus without complication ([place])   [place] [facility] Juluis Mire P, NP   4 months ago Diabetes mellitus without complication ([place])   [place] [facility] Kerin Perna, NP   1 year ago Diabetes mellitus without complication ([place])   [place] [facility] Kerin Perna, NP   1 year ago Diabetes mellitus without complication ([place])   [facility] Kerin Perna, NP   2 years ago Diabetes mellitus without complication ([place])   [facility] Kerin Perna, NP       Future Appointments  In 4 months [place], [place] Cage, NP [facility]             amLODipine (NORVASC) 10 MG tablet [Pharmacy Med Name: AMLODIPINE BESYLATE 10 MG ORAL TABLET] 90 tablet 1    Sig: TAKE 1 TABLET (10 MG TOTAL) BY MOUTH DAILY.     Cardiovascular: Calcium Channel Blockers 2 Passed - 01/20/2022  2:47 PM      Passed - Last BP in normal range    BP Readings from Last 1 Encounters:  12/02/21 132/87         Passed - Last Heart Rate in normal range    Pulse Readings from Last 1 Encounters:  12/02/21 93         Passed - Valid encounter within last 6 months    Recent Outpatient Visits           1 month ago Diabetes mellitus without complication ([place])   [place] [facility] Juluis Mire P, NP   4 months ago Diabetes mellitus without complication ([place])   [place] Kerin Perna, NP   1 year ago Diabetes mellitus without complication ([place])   [place] Kerin Perna, NP   1 year ago Diabetes mellitus without complication ([place])   [place] Kerin Perna, NP   2 years ago Diabetes mellitus without complication ([place])   [place], [place], NP       Future Appointments             In 4 months Oletta Lamas, Milford Cage, NP [place]             r

## 2022-01-20 NOTE — Telephone Encounter (Signed)
Requested Prescriptions  Pending Prescriptions Disp Refills   metFORMIN (GLUCOPHAGE-XR) 500 MG 24 hr tablet [Pharmacy Med Name: METFORMIN HCL ER 500 MG ORAL TABLET EXTENDED RELEASE 24 HOUR] 90 tablet 1    Sig: TAKE 1 TABLET (500 MG TOTAL) BY MOUTH DAILY WITH BREAKFAST.     Endocrinology:  Diabetes - Biguanides Failed - 01/20/2022  2:47 PM      Failed - Cr in normal range and within 360 days    Creatinine, Ser  Date Value Ref Range Status  10/14/2020 0.84 0.76 - 1.27 mg/dL Final         Failed - eGFR in normal range and within 360 days    GFR calc Af Amer  Date Value Ref Range Status  07/22/2019 >60 >60 mL/min Final   GFR calc non Af Amer  Date Value Ref Range Status  07/22/2019 >60 >60 mL/min Final   eGFR  Date Value Ref Range Status  10/14/2020 104 >59 mL/min/1.73 Final         Failed - B12 Level in normal range and within 720 days    Vitamin B-12  Date Value Ref Range Status  10/11/2017 >2000 (H) 232 - 1245 pg/mL Final         Failed - CBC within normal limits and completed in the last 12 months    WBC  Date Value Ref Range Status  10/14/2020 10.6 3.4 - 10.8 x10E3/uL Final  07/22/2019 10.6 (H) 4.0 - 10.5 K/uL Final   RBC  Date Value Ref Range Status  10/14/2020 5.14 4.14 - 5.80 x10E6/uL Final  07/22/2019 5.03 4.22 - 5.81 MIL/uL Final   Hemoglobin  Date Value Ref Range Status  10/14/2020 14.8 13.0 - 17.7 g/dL Final   Hematocrit  Date Value Ref Range Status  10/14/2020 45.5 37.5 - 51.0 % Final   MCHC  Date Value Ref Range Status  10/14/2020 32.5 31.5 - 35.7 g/dL Final  07/22/2019 31.4 30.0 - 36.0 g/dL Final   [facility]  Date Value Ref Range Status  10/14/2020 28.8 26.6 - 33.0 pg Final  07/22/2019 28.2 26.0 - 34.0 pg Final   MCV  Date Value Ref Range Status  10/14/2020 89 79 - 97 fL Final   No results found for: "PLTCOUNTKUC", "LABPLAT", "POCPLA" RDW  Date Value Ref Range Status  10/14/2020 11.5 (L) 11.6 - 15.4 % Final         Passed - HBA1C is between  0 and 7.9 and within 180 days    HbA1c, POC (controlled diabetic range)  Date Value Ref Range Status  12/02/2021 6.2 0.0 - 7.0 % Final         Passed - Valid encounter within last 6 months    Recent Outpatient Visits           1 month ago Diabetes mellitus without complication ([place])   [place] [facility] Juluis Mire P, NP   4 months ago Diabetes mellitus without complication ([place])   [place] [facility] Kerin Perna, NP   1 year ago Diabetes mellitus without complication ([place])   [place] [facility] Kerin Perna, NP   1 year ago Diabetes mellitus without complication ([place])   [facility] Kerin Perna, NP   2 years ago Diabetes mellitus without complication ([place])   [facility] Kerin Perna, NP       Future Appointments  In 4 months [place], Milford Cage, NP [facility]             amLODipine (NORVASC) 10 MG tablet [Pharmacy Med Name: AMLODIPINE BESYLATE 10 MG ORAL TABLET] 90 tablet 1    Sig: TAKE 1 TABLET (10 MG TOTAL) BY MOUTH DAILY.     Cardiovascular: Calcium Channel Blockers 2 Passed - 01/20/2022  2:47 PM      Passed - Last BP in normal range    BP Readings from Last 1 Encounters:  12/02/21 132/87         Passed - Last Heart Rate in normal range    Pulse Readings from Last 1 Encounters:  12/02/21 93         Passed - Valid encounter within last 6 months    Recent Outpatient Visits           1 month ago Diabetes mellitus without complication ([place])   [place] [facility] Juluis Mire P, NP   4 months ago Diabetes mellitus without complication ([place])   [place] Kerin Perna, NP   1 year ago Diabetes mellitus without complication ([place])   [place] Kerin Perna, NP   1 year ago Diabetes mellitus without complication ([place])   [place] Kerin Perna, NP   2 years ago Diabetes mellitus without complication ([place])   [place], [place], NP       Future Appointments             In 4 months Oletta Lamas, Milford Cage, NP [place]

## 2022-01-22 ENCOUNTER — Other Ambulatory Visit (INDEPENDENT_AMBULATORY_CARE_PROVIDER_SITE_OTHER): Payer: Self-pay | Admitting: Primary Care

## 2022-01-22 DIAGNOSIS — I10 Essential (primary) hypertension: Secondary | ICD-10-CM

## 2022-01-22 NOTE — Telephone Encounter (Signed)
Refilled 09/01/2021 #90 1 rf - confirmed by same pharmacy. Requested Prescriptions  Pending Prescriptions Disp Refills   amLODipine (NORVASC) 10 MG tablet [Pharmacy Med Name: AMLODIPINE BESYLATE 10 MG ORAL TABLET] 90 tablet 1    Sig: TAKE 1 TABLET (10 MG TOTAL) BY MOUTH DAILY.     Cardiovascular: Calcium Channel Blockers 2 Passed - 01/22/2022  2:02 PM      Passed - Last BP in normal range    BP Readings from Last 1 Encounters:  12/02/21 132/87         Passed - Last Heart Rate in normal range    Pulse Readings from Last 1 Encounters:  12/02/21 93         Passed - Valid encounter within last 6 months    Recent Outpatient Visits           1 month ago Diabetes mellitus without complication ([place])   [place] [facility] Juluis Mire P, NP   4 months ago Diabetes mellitus without complication ([place])   [place] Kerin Perna, NP   1 year ago Diabetes mellitus without complication ([place])   [place] Kerin Perna, NP   1 year ago Diabetes mellitus without complication ([place])   [place] Kerin Perna, NP   2 years ago Diabetes mellitus without complication ([place])   [place] Kerin Perna, NP       Future Appointments             In 4 months Oletta Lamas, Milford Cage, NP [place]

## 2022-01-22 NOTE — Telephone Encounter (Signed)
Requested Prescriptions  Pending Prescriptions Disp Refills   omeprazole (PRILOSEC) 20 MG capsule [Pharmacy Med Name: OMEPRAZOLE 20 MG ORAL CAPSULE DELAYED RELEASE] 90 capsule 0    Sig: TAKE 1 CAPSULE (20 MG TOTAL) BY MOUTH DAILY.     Gastroenterology: Proton Pump Inhibitors Passed - 01/22/2022  1:00 PM      Passed - Valid encounter within last 12 months    Recent Outpatient Visits           1 month ago Diabetes mellitus without complication ([place])   [place] [facility] Juluis Mire P, NP   4 months ago Diabetes mellitus without complication ([place])   [place] Kerin Perna, NP   1 year ago Diabetes mellitus without complication ([place])   [place] Kerin Perna, NP   1 year ago Diabetes mellitus without complication ([place])   [place] Kerin Perna, NP   2 years ago Diabetes mellitus without complication ([place])   [place], NP       Future Appointments             In 4 months Oletta Lamas, Milford Cage, NP [place]

## 2022-01-29 ENCOUNTER — Other Ambulatory Visit (INDEPENDENT_AMBULATORY_CARE_PROVIDER_SITE_OTHER): Payer: Self-pay | Admitting: Primary Care

## 2022-01-29 DIAGNOSIS — I10 Essential (primary) hypertension: Secondary | ICD-10-CM

## 2022-01-29 NOTE — Telephone Encounter (Signed)
Refilled 09/01/2021 #90 1 rf - confirmed by same pharmacy. Requested Prescriptions  Pending Prescriptions Disp Refills   amLODipine (NORVASC) 10 MG tablet [Pharmacy Med Name: AMLODIPINE BESYLATE 10 MG ORAL TABLET] 90 tablet 1    Sig: TAKE 1 TABLET (10 MG TOTAL) BY MOUTH DAILY.     Cardiovascular: Calcium Channel Blockers 2 Passed - 01/29/2022  2:45 PM      Passed - Last BP in normal range    BP Readings from Last 1 Encounters:  12/02/21 132/87         Passed - Last Heart Rate in normal range    Pulse Readings from Last 1 Encounters:  12/02/21 93         Passed - Valid encounter within last 6 months    Recent Outpatient Visits           1 month ago Diabetes mellitus without complication ([place])   [place] [facility] Juluis Mire P, NP   5 months ago Diabetes mellitus without complication ([place])   [place] Kerin Perna, NP   1 year ago Diabetes mellitus without complication ([place])   [place] Kerin Perna, NP   1 year ago Diabetes mellitus without complication ([place])   [place] Kerin Perna, NP   2 years ago Diabetes mellitus without complication ([place])   [place] Kerin Perna, NP       Future Appointments             In 4 months Oletta Lamas, Milford Cage, NP [place]

## 2022-02-12 IMAGING — DX DG CHEST 2V
2 series · 2 of 2 positions shown · non-contrast
Comparison: 02/14/2019

CLINICAL DATA: Chest pain

EXAM:
CHEST - 2 VIEW

[chest pa]
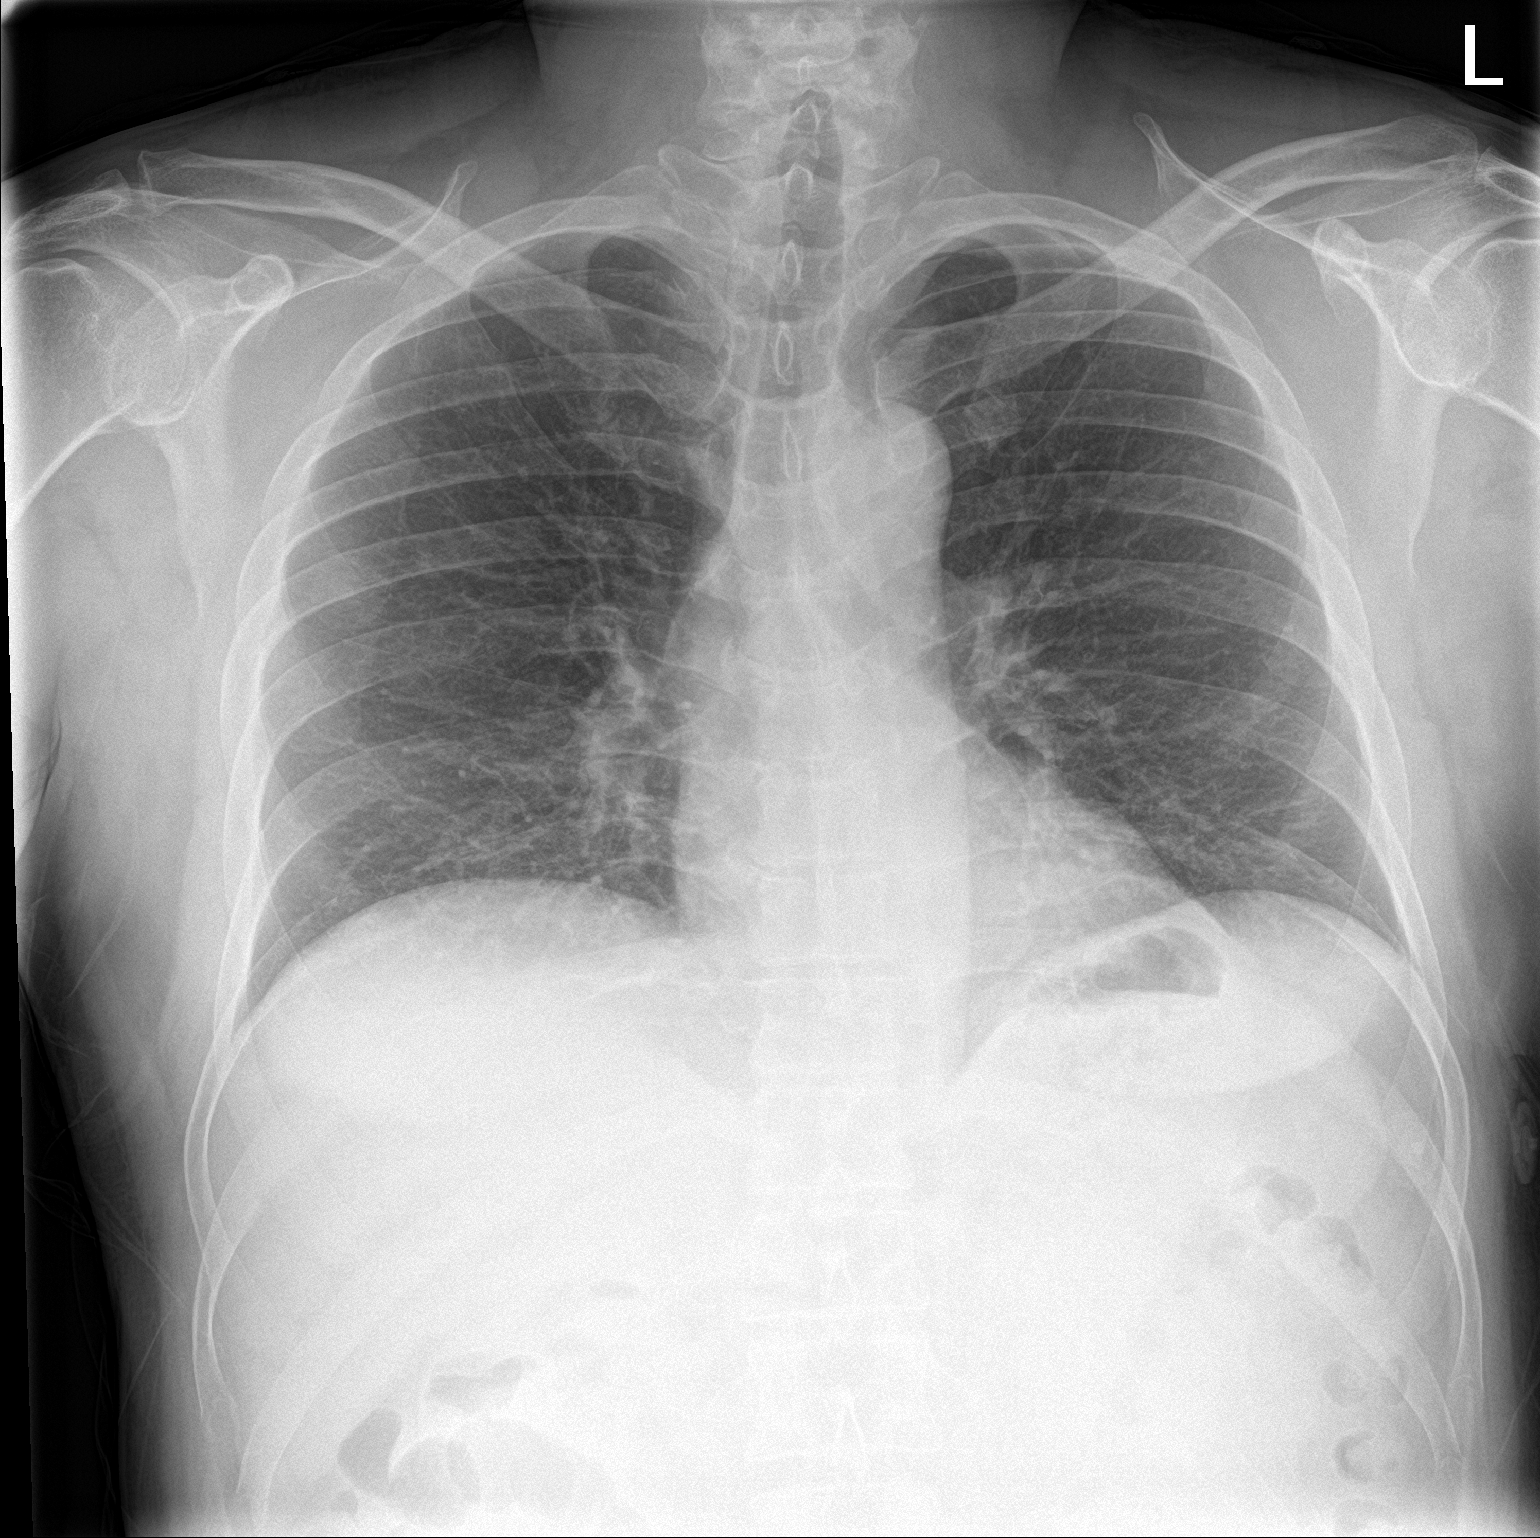

[chest lat]
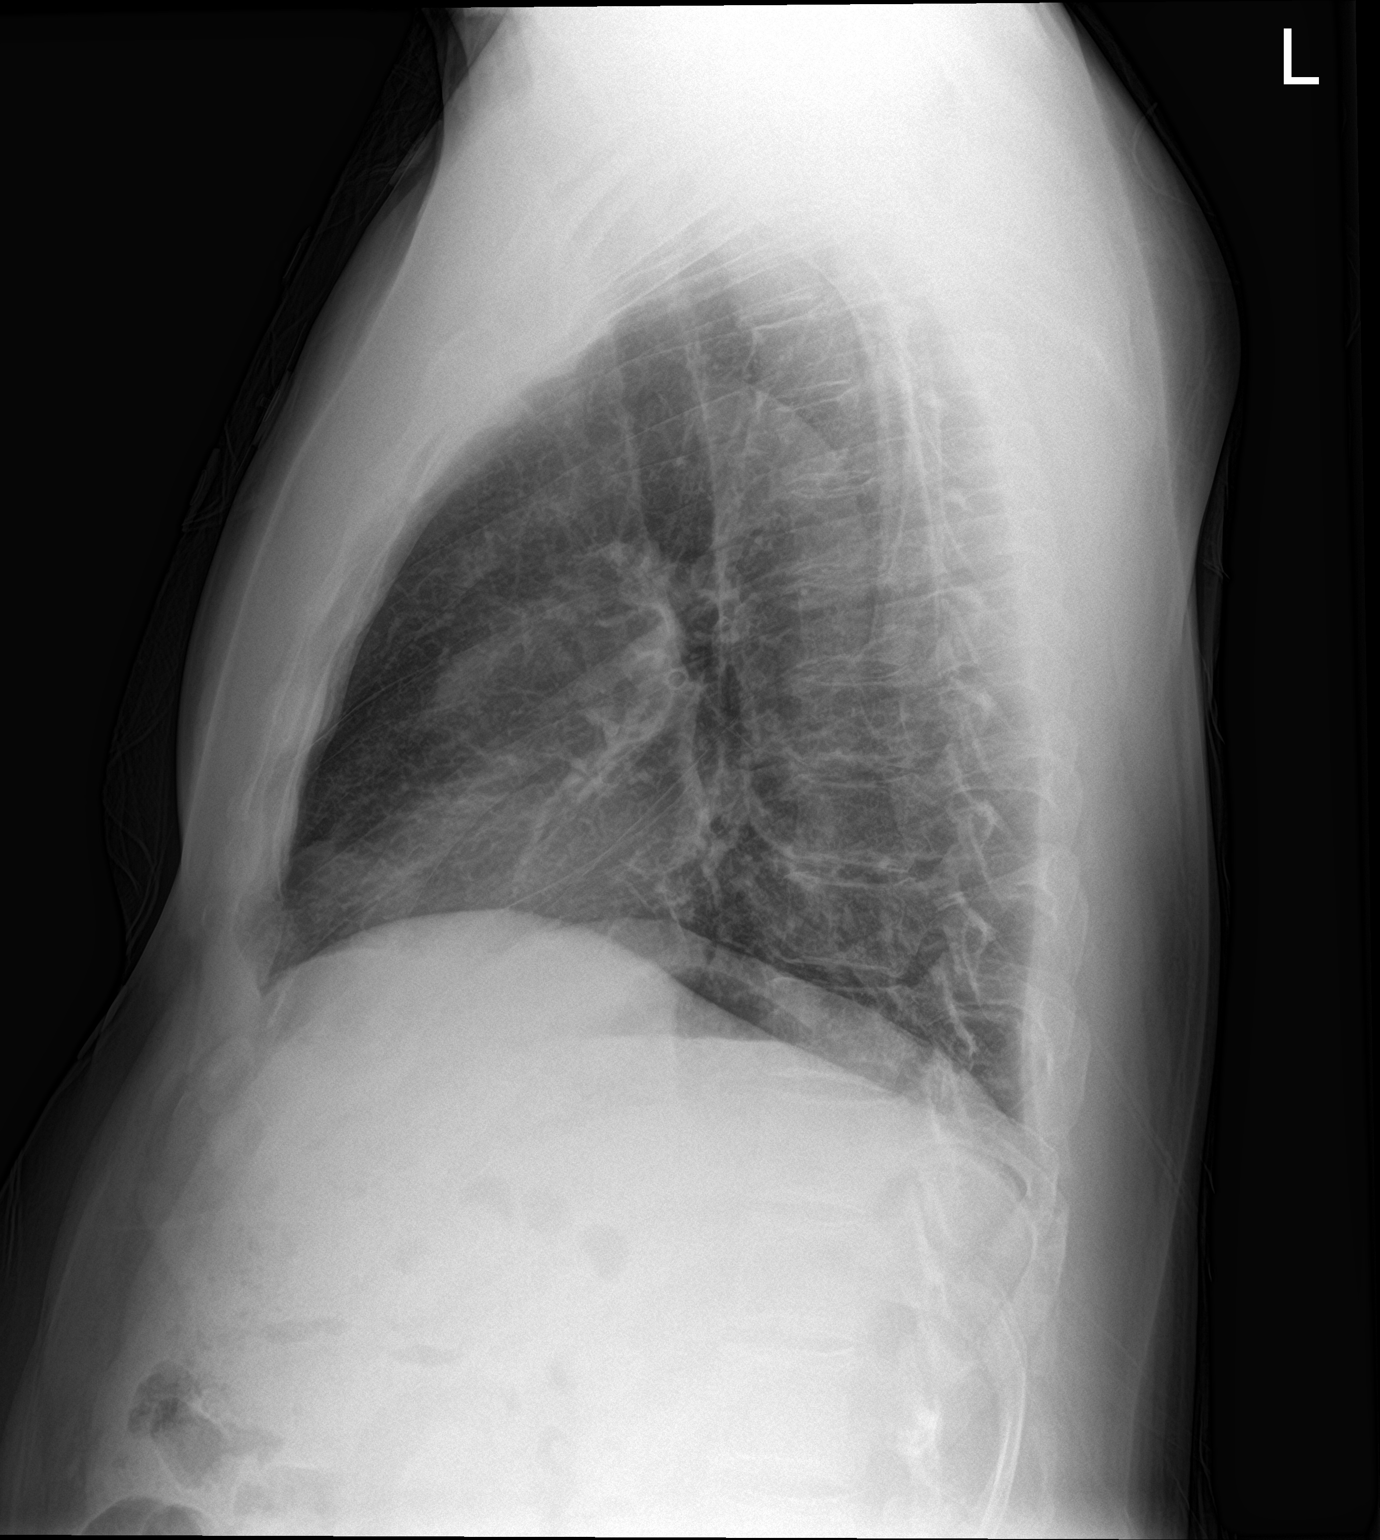

[2 of 2 positions shown; findings below may reference images not displayed]

FINDINGS: The heart size and mediastinal contours are within normal limits.
Both lungs are clear. The visualized skeletal structures are
unremarkable.
IMPRESSION: No acute abnormality of the lungs.

## 2022-03-05 LAB — HM DIABETES EYE EXAM

## 2022-03-19 ENCOUNTER — Other Ambulatory Visit (INDEPENDENT_AMBULATORY_CARE_PROVIDER_SITE_OTHER): Payer: Self-pay | Admitting: Primary Care

## 2022-03-19 DIAGNOSIS — E119 Type 2 diabetes mellitus without complications: Secondary | ICD-10-CM

## 2022-03-19 NOTE — Telephone Encounter (Signed)
Will forward to provider  

## 2022-03-23 ENCOUNTER — Encounter (INDEPENDENT_AMBULATORY_CARE_PROVIDER_SITE_OTHER): Payer: 59 | Admitting: Primary Care

## 2022-03-24 NOTE — Progress Notes (Signed)
No answer

## 2022-03-27 ENCOUNTER — Other Ambulatory Visit (INDEPENDENT_AMBULATORY_CARE_PROVIDER_SITE_OTHER): Payer: Self-pay | Admitting: Primary Care

## 2022-03-27 ENCOUNTER — Telehealth: Payer: Self-pay | Admitting: Emergency Medicine

## 2022-03-27 DIAGNOSIS — I10 Essential (primary) hypertension: Secondary | ICD-10-CM

## 2022-03-27 DIAGNOSIS — G4700 Insomnia, unspecified: Secondary | ICD-10-CM

## 2022-03-27 NOTE — Telephone Encounter (Signed)
Requested medications are due for refill today.  Duloxetine is, Losartan is not  Requested medications are on the active medications list.  yes  Last refill. 09/01/2021 and 12/02/2021  Future visit scheduled.   yes  Notes to clinic.  Labs are expired    Requested Prescriptions  Pending Prescriptions Disp Refills   DULoxetine (CYMBALTA) 60 MG capsule [Pharmacy Med Name: DULOXETINE HCL 60 MG ORAL CAPSULE DELAYED RELEASE PARTICLES] 90 capsule 1    Sig: TAKE 1 (ONE) CAPSULE (60 MG TOTAL) BY MOUTH DAILY.     Psychiatry: Antidepressants - SNRI - duloxetine Failed - 03/27/2022 11:48 AM      Failed - Cr in normal range and within 360 days    Creatinine, Ser  Date Value Ref Range Status  10/14/2020 0.84 0.76 - 1.27 mg/dL Final         Failed - eGFR is 30 or above and within 360 days    GFR calc Af Amer  Date Value Ref Range Status  07/22/2019 >60 >60 mL/min Final   GFR calc non Af Amer  Date Value Ref Range Status  07/22/2019 >60 >60 mL/min Final   eGFR  Date Value Ref Range Status  10/14/2020 104 >59 mL/min/1.73 Final         Passed - Completed PHQ-2 or PHQ-9 in the last 360 days      Passed - Last BP in normal range    BP Readings from Last 1 Encounters:  12/02/21 132/87         Passed - Valid encounter within last 6 months    Recent Outpatient Visits           3 months ago Diabetes mellitus without complication ([place])   [facility] Kerin Perna, NP   6 months ago Diabetes mellitus without complication [facility])   [place] [facility] Kerin Perna, NP   1 year ago Diabetes mellitus without complication [facility])   [facility] Kerin Perna, NP   2 years ago Diabetes mellitus without complication [facility])   [facility] Kerin Perna, NP   2 years ago Diabetes mellitus without complication [facility])   [place], Michelle  P, NP       Future Appointments             In 2 months Oletta Lamas, Milford Cage, NP [facility]             losartan (COZAAR) 25 MG tablet [Pharmacy Med Name: LOSARTAN POTASSIUM 25 MG ORAL TABLET] 90 tablet 1    Sig: Take 1 tablet (25 mg total) by mouth daily.     Cardiovascular:  Angiotensin Receptor Blockers Failed - 03/27/2022 11:48 AM      Failed - Cr in normal range and within 180 days    Creatinine, Ser  Date Value Ref Range Status  10/14/2020 0.84 0.76 - 1.27 mg/dL Final         Failed - K in normal range and within 180 days    Potassium  Date Value Ref Range Status  10/14/2020 4.3 3.5 - 5.2 mmol/L Final         Passed - Patient is not pregnant      Passed - Last BP in normal range    BP Readings from Last 1 Encounters:  12/02/21 132/87         Passed - Valid encounter within  last 6 months    Recent Outpatient Visits           3 months ago Diabetes mellitus without complication ([place])   [place] Kerin Perna, NP   6 months ago Diabetes mellitus without complication [facility])   [name] Kerin Perna, NP   1 year ago Diabetes mellitus without complication [facility])   [place], Michelle P, NP   2 years ago Diabetes mellitus without complication [facility])   [place], Michelle P, NP   2 years ago Diabetes mellitus without complication [facility])   [place], Michelle P, NP       Future Appointments             In 2 months Oletta Lamas, Milford Cage, NP [place]

## 2022-03-27 NOTE — Telephone Encounter (Signed)
Copied from [place] [id number]. Topic: Appointment Scheduling - Scheduling Inquiry for Clinic >> Mar 27, 2022 11:55 AM Marcellus Scott wrote: Reason for CRM: Pt requesting a call back to reschedule his AWV.  Please advise.

## 2022-04-01 NOTE — Telephone Encounter (Signed)
Will forward to provider  

## 2022-04-02 NOTE — Telephone Encounter (Signed)
Needs appt

## 2022-04-03 ENCOUNTER — Ambulatory Visit (INDEPENDENT_AMBULATORY_CARE_PROVIDER_SITE_OTHER): Payer: 59

## 2022-04-03 ENCOUNTER — Other Ambulatory Visit (INDEPENDENT_AMBULATORY_CARE_PROVIDER_SITE_OTHER): Payer: Self-pay | Admitting: Primary Care

## 2022-04-03 DIAGNOSIS — Z0000 Encounter for general adult medical examination without abnormal findings: Secondary | ICD-10-CM

## 2022-04-03 DIAGNOSIS — Z122 Encounter for screening for malignant neoplasm of respiratory organs: Secondary | ICD-10-CM

## 2022-04-03 NOTE — Progress Notes (Signed)
Subjective:   Kyle Novak is a 57 y.o. male who presents for Medicare Annual/Subsequent preventive examination.  I connected with Kyle Novak on 04/03/2022 by telephone and verified that I am speaking with the correct person using two identifiers.   I discussed the limitations, risks, security and privacy concerns of performing an evaluation and management service by telephone and the availability of in person appointments. I also discussed with the patient that there may be a patient responsible charge related to this service. The patient expressed understanding and verbally consented to this telephonic visit.   Location of Patient: Home Location of Provider: Office     Review of Systems    Defer to PCP       Objective:    There were no vitals filed for this visit. There is no height or weight on file to calculate BMI.     04/03/2022   12:37 PM 11/17/2020    9:28 AM 02/14/2019    5:24 PM 02/07/2019   12:35 AM 01/11/2019    3:48 PM 11/23/2018    8:33 AM 11/16/2018    2:48 PM  Advanced Directives  Does Patient Have a Medical Advance Directive? No No No No No No No  Would patient like information on creating a medical advance directive? No - Patient declined No - Patient declined Yes (ED - Information included in AVS) No - Patient declined No - Patient declined No - Patient declined No - Patient declined    Current Medications (verified) Outpatient Encounter Medications as of 04/03/2022  Medication Sig   albuterol (VENTOLIN HFA) 108 (90 Base) MCG/ACT inhaler Inhale 1-2 puffs into the lungs every 6 (six) hours as needed for wheezing or shortness of breath.   amLODipine (NORVASC) 10 MG tablet Take 1 tablet (10 mg total) by mouth daily.   diclofenac Sodium (VOLTAREN) 1 % GEL Apply 2 g topically 4 (four) times daily.   DULoxetine (CYMBALTA) 60 MG capsule TAKE 1 (ONE) CAPSULE (60 MG TOTAL) BY MOUTH DAILY.   gabapentin (NEURONTIN) 600 MG tablet TAKE 2 TABLETS (1,200 MG  TOTAL) BY MOUTH 3 (THREE) TIMES DAILY.   losartan (COZAAR) 25 MG tablet TAKE 1 TABLET (25 MG TOTAL) BY MOUTH DAILY.   metFORMIN (GLUCOPHAGE-XR) 500 MG 24 hr tablet TAKE 1 TABLET (500 MG TOTAL) BY MOUTH DAILY WITH BREAKFAST.   omeprazole (PRILOSEC) 20 MG capsule TAKE 1 CAPSULE (20 MG TOTAL) BY MOUTH DAILY.   tadalafil (CIALIS) 10 MG tablet Take 1 tablet (10 mg total) by mouth every other day as needed for erectile dysfunction.   TRULICITY [id number] SOPN INJECT 0.75 MG INTO THE SKIN ONCE A WEEK.   Facility-Administered Encounter Medications as of 04/03/2022  Medication   acetaminophen (TYLENOL) tablet 650 mg    Allergies (verified) Patient has no known allergies.   History: Past Medical History:  Diagnosis Date   Diabetes mellitus without complication (HCC)    GERD (gastroesophageal reflux disease)    Hypertension    Past Surgical History:  Procedure Laterality Date   KNEE ARTHROSCOPY WITH MEDIAL MENISECTOMY Right 11/23/2018   Procedure: RIGHT KNEE ARTHROSCOPY WITH PARTIAL MEDIAL MENISCECTOMY, SYNOVECTOMY;  Surgeon: Leandrew Koyanagi, MD;  Location: [place];  Service: Orthopedics;  Laterality: Right;   NO PAST SURGERIES     WISDOM TOOTH EXTRACTION     Family History  Problem Relation Age of Onset   Lung cancer Father    Heart failure Brother    Social History  Socioeconomic History   Marital status: Single    Spouse name: Not on file   Number of children: Not on file   Years of education: Not on file   Highest education level: Not on file  Occupational History   Not on file  Tobacco Use   Smoking status: Every Day    Packs/day: 0.50    Types: Cigarettes   Smokeless tobacco: Never  Substance and Sexual Activity   Alcohol use: Yes    Comment: OCCASIONAL   Drug use: No   Sexual activity: Not on file  Other Topics Concern   Not on file  Social History Narrative   Not on file   Social Determinants of Health   Financial Resource Strain: Low Risk   (11/17/2020)   Overall Financial Resource Strain (CARDIA)    Difficulty of Paying Living Expenses: Not hard at all  Food Insecurity: No Food Insecurity (11/17/2020)   Hunger Vital Sign    Worried About Running Out of Food in the Last Year: Never true    [place] in the Last Year: Never true  Transportation Needs: No Transportation Needs (11/17/2020)   PRAPARE - Hydrologist (Medical): No    Lack of Transportation (Non-Medical): No  Physical Activity: Unknown (11/17/2020)   Exercise Vital Sign    Days of Exercise per Week: Not on file    Minutes of Exercise per Session: 30 min  Stress: No Stress Concern Present (11/17/2020)   [place]    Feeling of Stress : Not at all  Social Connections: Unknown (11/17/2020)   Social Connection and Isolation Panel [NHANES]    Frequency of Communication with Friends and Family: More than three times a week    Frequency of Social Gatherings with Friends and Family: More than three times a week    Attends Religious Services: Never    Marine scientist or Organizations: No    Attends Music therapist: Never    Marital Status: Not on file    Tobacco Counseling Ready to quit: Not Answered Counseling given: Not Answered   Clinical Intake:     Pain : No/denies pain     Diabetes: Yes CBG done?: No     Diabetic?Yes         Activities of Daily Living    04/03/2022   12:36 PM  In your present state of health, do you have any difficulty performing the following activities:  Hearing? 0  Vision? 0  Difficulty concentrating or making decisions? 0  Walking or climbing stairs? 0  Dressing or bathing? 0  Doing errands, shopping? 0  Preparing Food and eating ? N  Using the Toilet? N  In the past six months, have you accidently leaked urine? N  Do you have problems with loss of bowel control? N  Managing your Medications? N   Managing your Finances? N  Housekeeping or managing your Housekeeping? N    Patient Care Team: Kerin Perna, NP as PCP - General (Internal Medicine)  Indicate any recent Medical Services you may have received from other than [facility] providers in the past year (date may be approximate).     Assessment:   This is a routine wellness examination for Kyle Novak.  Hearing/Vision screen No results found.  Dietary issues and exercise activities discussed:     Goals Addressed   None   Depression Screen    04/03/2022  12:36 PM 09/01/2021    2:10 PM 10/14/2020   11:13 AM 03/19/2020    4:10 PM 08/16/2019    2:26 PM 03/30/2019    3:38 PM 12/27/2018    3:43 PM  PHQ 2/9 Scores  PHQ - 2 Score 0 0 0 0 0 0 0    Fall Risk    04/03/2022   12:36 PM 12/02/2021    3:14 PM 09/01/2021    2:10 PM 11/17/2020    9:29 AM 10/14/2020   11:13 AM  Fall Risk   Falls in the past year? 0 0 0 0 0  Number falls in past yr: 0 0  0   Injury with Fall? 0 0  0   Risk for fall due to : No Fall Risks No Fall Risks  No Fall Risks   Follow up    Falls evaluation completed     FALL RISK PREVENTION PERTAINING TO THE HOME:  Any stairs in or around the home? Yes  If so, are there any without handrails? No  Home free of loose throw rugs in walkways, pet beds, electrical cords, etc? Yes  Adequate lighting in your home to reduce risk of falls? Yes   ASSISTIVE DEVICES UTILIZED TO PREVENT FALLS:  Life alert? No  Use of a cane, walker or w/c? No  Grab bars in the bathroom? No  Shower chair or bench in shower? No  Elevated toilet seat or a handicapped toilet? No   TIMED UP AND GO:  Was the test performed? No .  Length of time to ambulate 10 feet: Pt states it would take him 5-10 secs  Gait steady and fast without use of assistive device  Cognitive Function:    04/03/2022   12:37 PM  MMSE - Mini Mental State Exam  Orientation to time 5  Orientation to Place 5  Registration 3  Attention/ Calculation 5   Recall 3  Language- name 2 objects 2  Language- repeat 1  Language- follow 3 step command 3  Language- read & follow direction 1  Write a sentence 1  Copy design 1  Total score 30        11/17/2020    9:32 AM  6CIT Screen  What Year? 0 points  What month? 0 points  What time? 0 points  Count back from 20 0 points  Months in reverse 0 points  Repeat phrase 0 points  Total Score 0 points    Immunizations Immunization History  Administered Date(s) Administered   PFIZER(Purple Top)SARS-COV-2 Vaccination 04/27/2019, 05/18/2019   Pneumococcal Polysaccharide-23 03/30/2018   Zoster, Live 04/06/2018    TDAP status: Due, Education has been provided regarding the importance of this vaccine. Advised may receive this vaccine at local pharmacy or Health Dept. Aware to provide a copy of the vaccination record if obtained from local pharmacy or Health Dept. Verbalized acceptance and understanding. Pt stated he had a Tdap when he had to get stiches a while back but doesn't remember when that was. Pt state she will get another Tdap to stay up to date   Flu Vaccine status: Declined, Education has been provided regarding the importance of this vaccine but patient still declined. Advised may receive this vaccine at local pharmacy or Health Dept. Aware to provide a copy of the vaccination record if obtained from local pharmacy or Health Dept. Verbalized acceptance and understanding.  Pneumococcal vaccine status: Due, Education has been provided regarding the importance of this vaccine. Advised may receive this  vaccine at local pharmacy or Health Dept. Aware to provide a copy of the vaccination record if obtained from local pharmacy or Health Dept. Verbalized acceptance and understanding. Pt states he will think about getting pcv 20.   Covid-19 vaccine status: Completed vaccines  Qualifies for Shingles Vaccine? Yes   Zostavax completed No   Shingrix Completed?: No.    Education has been provided  regarding the importance of this vaccine. Patient has been advised to call insurance company to determine out of pocket expense if they have not yet received this vaccine. Advised may also receive vaccine at local pharmacy or Health Dept. Verbalized acceptance and understanding.  Screening Tests Health Maintenance  Topic Date Due   Diabetic kidney evaluation - Urine ACR  Never done   DTaP/Tdap/Td (1 - Tdap) Never done   Zoster Vaccines- Shingrix (1 of 2) Never done   Diabetic kidney evaluation - eGFR measurement  10/14/2021   COVID-19 Vaccine (3 - 2023-24 season) 10/17/2021   INFLUENZA VACCINE  05/17/2022 (Originally 09/16/2021)   HEMOGLOBIN A1C  06/03/2022   FOOT EXAM  12/03/2022   OPHTHALMOLOGY EXAM  03/06/2023   Medicare Annual Wellness (AWV)  04/04/2023   COLONOSCOPY (Pts 45-19yr Insurance coverage will need to be confirmed)  04/06/2028   Hepatitis C Screening  Completed   HIV Screening  Completed   HPV VACCINES  Aged Out    Health Maintenance  Health Maintenance Due  Topic Date Due   Diabetic kidney evaluation - Urine ACR  Never done   DTaP/Tdap/Td (1 - Tdap) Never done   Zoster Vaccines- Shingrix (1 of 2) Never done   Diabetic kidney evaluation - eGFR measurement  10/14/2021   COVID-19 Vaccine (3 - 2023-24 season) 10/17/2021    Colorectal cancer screening: Type of screening: Colonoscopy. Completed 04/06/2018. Repeat every 10 years  Lung Cancer Screening: (Low Dose CT Chest recommended if Age 57-80years, 30 pack-year currently smoking OR have quit w/in 15years.) does qualify.   Lung Cancer Screening Referral: Will have provider place order  Additional Screening:  Hepatitis C Screening: does qualify; Completed 1/30/219  Vision Screening: Recommended annual ophthalmology exams for early detection of glaucoma and other disorders of the eye. Is the patient up to date with their annual eye exam?  Yes  Who is the provider or what is the name of the office in which the  patient [facility]ends [facility]  If pt is not established with a provider, would they like to be referred to a provider to establish care? No .   Dental Screening: Recommended annual dental exams for proper oral hygiene  Community Resource Referral / Chronic Care Management: CRR required this visit?  No   CCM required this visit?  No      Plan:     I have personally reviewed and noted the following in the patient's chart:   Medical and social history Use of alcohol, tobacco or illicit drugs  Current medications and supplements including opioid prescriptions. Patient is not currently taking opioid prescriptions. Functional ability and status Nutritional status Physical activity Advanced directives List of other physicians Hospitalizations, surgeries, and ER visits in previous 12 months Vitals Screenings to include cognitive, depression, and falls Referrals and appointments  In addition, I have reviewed and discussed with patient certain preventive protocols, quality metrics, and best practice recommendations. A written personalized care plan for preventive services as well as general preventive health recommendations were provided to patient.     Jay'A R  Alford [place], [place]   04/03/2022   Nurse Notes: Non face to face 11 minutes.

## 2022-04-06 ENCOUNTER — Other Ambulatory Visit (INDEPENDENT_AMBULATORY_CARE_PROVIDER_SITE_OTHER): Payer: Self-pay | Admitting: Primary Care

## 2022-04-06 DIAGNOSIS — Z122 Encounter for screening for malignant neoplasm of respiratory organs: Secondary | ICD-10-CM

## 2022-04-20 ENCOUNTER — Other Ambulatory Visit (INDEPENDENT_AMBULATORY_CARE_PROVIDER_SITE_OTHER): Payer: Self-pay | Admitting: Primary Care

## 2022-04-20 DIAGNOSIS — F17209 Nicotine dependence, unspecified, with unspecified nicotine-induced disorders: Secondary | ICD-10-CM

## 2022-04-24 ENCOUNTER — Other Ambulatory Visit (INDEPENDENT_AMBULATORY_CARE_PROVIDER_SITE_OTHER): Payer: Self-pay | Admitting: Primary Care

## 2022-04-24 DIAGNOSIS — I10 Essential (primary) hypertension: Secondary | ICD-10-CM

## 2022-05-04 ENCOUNTER — Ambulatory Visit
Admission: RE | Admit: 2022-05-04 | Discharge: 2022-05-04 | Disposition: A | Discharge status: Home / Self Care | Payer: 59 | Source: Ambulatory Visit | Attending: Primary Care | Admitting: Primary Care

## 2022-05-04 DIAGNOSIS — Z122 Encounter for screening for malignant neoplasm of respiratory organs: Secondary | ICD-10-CM

## 2022-05-12 NOTE — Progress Notes (Signed)
Encourage stop smoking. Test repeat in a year -new dx emphysema

## 2022-05-26 ENCOUNTER — Other Ambulatory Visit (INDEPENDENT_AMBULATORY_CARE_PROVIDER_SITE_OTHER): Payer: Self-pay | Admitting: Primary Care

## 2022-05-26 DIAGNOSIS — G4700 Insomnia, unspecified: Secondary | ICD-10-CM

## 2022-05-26 NOTE — Telephone Encounter (Signed)
Requested medication (s) are due for refill today: yes  Requested medication (s) are on the active medication list: yes  Last refill:  04/02/22 #30/0  Future visit scheduled: yes  Notes to clinic:  Unable to refill per protocol due to failed labs, no updated results.     Requested Prescriptions  Pending Prescriptions Disp Refills   DULoxetine (CYMBALTA) 60 MG capsule [Pharmacy Med Name: DULOXETINE HCL 60 MG ORAL CAPSULE DELAYED RELEASE PARTICLES] 30 capsule 0    Sig: TAKE 1 (ONE) CAPSULE (60 MG TOTAL) BY MOUTH DAILY.     Psychiatry: Antidepressants - SNRI - duloxetine Failed - 05/26/2022  1:16 PM      Failed - Cr in normal range and within 360 days    Creatinine, Ser  Date Value Ref Range Status  10/14/2020 0.84 0.76 - 1.27 mg/dL Final         Failed - eGFR is 30 or above and within 360 days    GFR calc Af Amer  Date Value Ref Range Status  07/22/2019 >60 >60 mL/min Final   GFR calc non Af Amer  Date Value Ref Range Status  07/22/2019 >60 >60 mL/min Final   eGFR  Date Value Ref Range Status  10/14/2020 104 >59 mL/min/1.73 Final         Passed - Completed PHQ-2 or PHQ-9 in the last 360 days      Passed - Last BP in normal range    BP Readings from Last 1 Encounters:  12/02/21 132/87         Passed - Valid encounter within last 6 months    Recent Outpatient Visits           5 months ago Diabetes mellitus without complication (HCC)   [place] [facility] Grayce Sessions, NP   8 months ago Diabetes mellitus without complication [facility])   [place] [facility] Grayce Sessions, NP   1 year ago Diabetes mellitus without complication [facility])   [place] [facility] Grayce Sessions, NP   2 years ago Diabetes mellitus without complication [facility])   [place] [facility] Grayce Sessions, NP   2 years ago Diabetes mellitus without complication [facility])   [place] [facility]  Grayce Sessions, NP       Future Appointments             In 1 week Randa Evens, Kinnie Scales, NP  [facility]

## 2022-06-01 ENCOUNTER — Other Ambulatory Visit (INDEPENDENT_AMBULATORY_CARE_PROVIDER_SITE_OTHER): Payer: Self-pay | Admitting: Primary Care

## 2022-06-01 DIAGNOSIS — G4700 Insomnia, unspecified: Secondary | ICD-10-CM

## 2022-06-02 ENCOUNTER — Encounter: Payer: Self-pay | Admitting: Pharmacist

## 2022-06-02 NOTE — Progress Notes (Signed)
[facility] Quality Pharmacy Team Statin Quality Measure Assessment   06/02/2022  Kyle Novak 08/05/1965 [id number]  Per review of chart and payor information, [name] has a diagnosis of diabetes but is not currently filling a statin prescription.  This places patient into the Statin Use In Patients with Diabetes (SUPD) measure for CMS.  His last fill for atorvastatin was January 2022.  His 10-year ASCVD risk score (Arnett DK, et al., 2019) is: 33.1%.  Please consider discussing compliance with statin therapy at today's office visit.  Please consider ONE of the following recommendations:  Initiate moderate intensity statin Atorvastatin 10 mg once daily, #90, 3 refills   Rosuvastatin 5 mg once daily, #90, 3 refills    Initiate low intensity          statin with reduced frequency if prior          statin intolerance 1x weekly, #13, 3 refills   2x weekly, #26, 3 refills   3x weekly, #39, 3 refills    Code for past statin intolerance or  other exclusions (required annually)  Provider Requirements: Associate code during an office visit or telehealth encounter  Drug Induced Myopathy G72.0   Myopathy, unspecified G72.9   Myositis, unspecified M60.9   Rhabdomyolysis M62.82   Cirrhosis of liver K74.69   Prediabetes R73.03   PCOS E28.2   Thank you for allowing [facility] pharmacy to be a part of this patient's care. Dellie Burns, PharmD [facility]  [facility] Clinical Pharmacist Office: [phone]

## 2022-06-03 ENCOUNTER — Encounter (INDEPENDENT_AMBULATORY_CARE_PROVIDER_SITE_OTHER): Payer: Self-pay | Admitting: Primary Care

## 2022-06-03 ENCOUNTER — Ambulatory Visit (INDEPENDENT_AMBULATORY_CARE_PROVIDER_SITE_OTHER): Payer: 59 | Admitting: Primary Care

## 2022-06-03 VITALS — BP 123/83 | HR 84 | Resp 16 | Ht 69.0 in | Wt 175.6 lb

## 2022-06-03 DIAGNOSIS — E119 Type 2 diabetes mellitus without complications: Secondary | ICD-10-CM | POA: Diagnosis not present

## 2022-06-03 DIAGNOSIS — I10 Essential (primary) hypertension: Secondary | ICD-10-CM

## 2022-06-03 LAB — POCT GLYCOSYLATED HEMOGLOBIN (HGB A1C): HbA1c, POC (controlled diabetic range): 6.3 % (ref 0.0–7.0)

## 2022-06-03 NOTE — Progress Notes (Signed)
Subjective:  Patient ID: Kyle Novak, male    DOB: 20-Mar-1965  Age: 57 y.o. MRN: [id number]  CC: No chief complaint on file.   HPI Kyle Novak presents for follow-up of diabetes. Patient does check blood sugar at home.  Management of hypertension-Patient has No headache, No chest pain, No abdominal pain - No Nausea, No new weakness tingling or numbness, No Cough - shortness of breath   Compliant with meds - Yes Checking CBGs? Yes  Fasting avg - 110-200  Postprandial average -  Exercising regularly? - Yes Watching carbohydrate intake? - Yes Neuropathy ? - No Hypoglycemic events - No  - Recovers with :   Pertinent ROS:  Polyuria - No Polydipsia - No Vision problems - No  Medications as noted below. Taking them regularly without complication/adverse reaction being reported today.   History Kyle Novak has a past medical history of Diabetes mellitus without complication (HCC), GERD (gastroesophageal reflux disease), and Hypertension.   Kyle Novak has a past surgical history that includes No past surgeries; Wisdom tooth extraction; and Knee arthroscopy with medial menisectomy (Right, 11/23/2018).   His family history includes Heart failure in his brother; Lung cancer in his father.Kyle Novak reports that Kyle Novak has been smoking cigarettes. Kyle Novak has been smoking an average of .5 packs per day. Kyle Novak has never used smokeless tobacco. Kyle Novak reports current alcohol use. Kyle Novak reports that Kyle Novak does not use drugs.  Current Outpatient Medications on File Prior to Visit  Medication Sig Dispense Refill   albuterol (VENTOLIN HFA) 108 (90 Base) MCG/ACT inhaler Inhale 1-2 puffs into the lungs every 6 (six) hours as needed for wheezing or shortness of breath. 18 g 1   amLODipine (NORVASC) 10 MG tablet TAKE 1 TABLET (10 MG TOTAL) BY MOUTH DAILY. 90 tablet 1   diclofenac Sodium (VOLTAREN) 1 % GEL Apply 2 g topically 4 (four) times daily. 150 g 1   DULoxetine (CYMBALTA) 60 MG capsule TAKE 1 (ONE) CAPSULE (60 MG TOTAL) BY  MOUTH DAILY. 30 capsule 0   gabapentin (NEURONTIN) 600 MG tablet TAKE 2 TABLETS (1,200 MG TOTAL) BY MOUTH 3 (THREE) TIMES DAILY. 540 tablet 1   losartan (COZAAR) 25 MG tablet TAKE 1 TABLET (25 MG TOTAL) BY MOUTH DAILY. 30 tablet 0   metFORMIN (GLUCOPHAGE-XR) 500 MG 24 hr tablet TAKE 1 TABLET (500 MG TOTAL) BY MOUTH DAILY WITH BREAKFAST. 90 tablet 1   omeprazole (PRILOSEC) 20 MG capsule TAKE 1 CAPSULE (20 MG TOTAL) BY MOUTH DAILY. 90 capsule 0   tadalafil (CIALIS) 10 MG tablet Take 1 tablet (10 mg total) by mouth every other day as needed for erectile dysfunction. 10 tablet 1   TRULICITY 0.75 MG/0.5ML SOPN INJECT 0.75 MG INTO THE SKIN ONCE A WEEK. 2 mL 2   Current Facility-Administered Medications on File Prior to Visit  Medication Dose Route Frequency Provider Last Rate Last Admin   acetaminophen (TYLENOL) tablet 650 mg  650 mg Oral Q4H PRN Francene Boyers, MD        ROS Comprehensive ROS Pertinent positive and negative noted in HPI    Objective:  There were no vitals taken for this visit.  BP Readings from Last 3 Encounters:  12/02/21 132/87  09/01/21 (Abnormal) 138/98  10/14/20 129/90    Wt Readings from Last 3 Encounters:  12/02/21 179 lb (81.2 kg)  09/01/21 179 lb 12.8 oz (81.6 kg)  10/14/20 183 lb (83 kg)   Physical exam: General: Vital signs reviewed.  Patient is well-developed and well-nourished, overweight  no acute distress and cooperative with exam. Head: Normocephalic and atraumatic. Eyes: EOMI, conjunctivae normal, no scleral icterus. Neck: Supple, trachea midline, normal ROM, no JVD, masses, thyromegaly, or carotid bruit present. Cardiovascular: RRR, S1 normal, S2 normal, no murmurs, gallops, or rubs. Pulmonary/Chest: Clear to auscultation bilaterally, no wheezes, rales, or rhonchi. Abdominal: Soft, non-tender, non-distended, BS +, no masses, organomegaly, or guarding present. Musculoskeletal: No joint deformities, erythema, or stiffness, ROM full and  nontender. Extremities: No lower extremity edema bilaterally,  pulses symmetric and intact bilaterally. No cyanosis or clubbing. Neurological: A&O x3, Strength is normal Skin: Warm, dry and intact. No rashes or erythema. Psychiatric: Normal mood and affect. speech and behavior is normal. Cognition and memory are normal.    Lab Results  Component Value Date   HGBA1C 6.2 12/02/2021   HGBA1C 6.3 (A) 09/01/2021   HGBA1C 7.4 (A) 10/14/2020    Lab Results  Component Value Date   WBC 10.6 10/14/2020   HGB 14.8 10/14/2020   HCT 45.5 10/14/2020   PLT 365 10/14/2020   GLUCOSE 245 (H) 10/14/2020   CHOL 149 10/14/2020   TRIG 132 10/14/2020   HDL 45 10/14/2020   LDLCALC 81 10/14/2020   ALT 37 10/14/2020   AST 23 10/14/2020   NA 138 10/14/2020   K 4.3 10/14/2020   CL 101 10/14/2020   CREATININE 0.84 10/14/2020   BUN 16 10/14/2020   CO2 22 10/14/2020   INR 1.0 02/07/2019   HGBA1C 6.2 12/02/2021     Assessment & Plan:  Kyle Novak was seen today for diabetes.  Diagnoses and all orders for this visit:  Diabetes mellitus without complication (HCC) - educated on lifestyle modifications, including but not limited to diet choices and adding exercise to daily routine.   -     POCT glycosylated hemoglobin (Hb A1C) 6.3 -     Microalbumin, urine -     Ambulatory referral to Optometry  Hypertension, unspecified type BP goal - < 130/80 Explained that having normal blood pressure is the goal and medications are helping to get to goal and maintain normal blood pressure. DIET: Limit salt intake, read nutrition labels to check salt content, limit fried and high fatty foods  Avoid using multisymptom OTC cold preparations that generally contain sudafed which can rise BP. Consult with pharmacist on best cold relief products to use for persons with HTN EXERCISE Discussed incorporating exercise such as walking - 30 minutes most days of the week and can do in 10 minute intervals    -     losartan  (COZAAR) 25 MG tablet; Take 1 tablet (25 mg total) by mouth daily.  Comprehensive diabetic foot examination, type 2 DM, encounter for [facility]) completed   I am having Kyle Novak maintain his diclofenac Sodium, gabapentin, albuterol, tadalafil, metFORMIN, Trulicity, losartan, omeprazole, amLODipine, and DULoxetine.      Follow-up:   No follow-ups on file.  The above assessment and management plan was discussed with the patient. The patient verbalized understanding of and has agreed to the management plan. Patient is aware to call the clinic if symptoms fail to improve or worsen. Patient is aware when to return to the clinic for a follow-up visit. Patient educated on when it is appropriate to go to the emergency department.   Gwinda Passe, NP-C

## 2022-06-04 LAB — CMP14+EGFR

## 2022-06-04 LAB — MICROALBUMIN / CREATININE URINE RATIO
Creatinine, Urine: 162.5 mg/dL
Microalb/Creat Ratio: 8 mg/g creat (ref 0–29)

## 2022-06-04 LAB — LIPID PANEL

## 2022-06-05 LAB — CMP14+EGFR
ALT: 44 IU/L (ref 0–44)
Albumin: 4.9 g/dL (ref 3.8–4.9)
Alkaline Phosphatase: 143 IU/L — ABNORMAL HIGH (ref 44–121)
BUN/Creatinine Ratio: 13 (ref 9–20)
BUN: 11 mg/dL (ref 6–24)
CO2: 20 mmol/L (ref 20–29)
Calcium: 10.4 mg/dL — ABNORMAL HIGH (ref 8.7–10.2)
Chloride: 101 mmol/L (ref 96–106)
Glucose: 116 mg/dL — ABNORMAL HIGH (ref 70–99)
Potassium: 4.2 mmol/L (ref 3.5–5.2)
Sodium: 142 mmol/L (ref 134–144)
Total Protein: 7.9 g/dL (ref 6.0–8.5)

## 2022-06-05 LAB — LIPID PANEL
Chol/HDL Ratio: 2.8 ratio (ref 0.0–5.0)
Cholesterol, Total: 169 mg/dL (ref 100–199)
HDL: 60 mg/dL (ref >39)
LDL Chol Calc (NIH): 97 mg/dL (ref 0–99)
Triglycerides: 64 mg/dL (ref 0–149)

## 2022-06-05 LAB — MICROALBUMIN / CREATININE URINE RATIO: Microalbumin, Urine: 13 ug/mL

## 2022-06-08 ENCOUNTER — Other Ambulatory Visit (INDEPENDENT_AMBULATORY_CARE_PROVIDER_SITE_OTHER): Payer: Self-pay | Admitting: Primary Care

## 2022-06-08 MED ORDER — PRAVASTATIN SODIUM 10 MG PO TABS: 10.0000 mg | ORAL_TABLET | Freq: Every day | ORAL | 1 refills | Status: DC

## 2022-06-09 ENCOUNTER — Telehealth (INDEPENDENT_AMBULATORY_CARE_PROVIDER_SITE_OTHER): Payer: Self-pay

## 2022-06-09 ENCOUNTER — Other Ambulatory Visit (INDEPENDENT_AMBULATORY_CARE_PROVIDER_SITE_OTHER): Payer: Self-pay | Admitting: Primary Care

## 2022-06-09 DIAGNOSIS — E119 Type 2 diabetes mellitus without complications: Secondary | ICD-10-CM

## 2022-06-09 NOTE — Telephone Encounter (Signed)
Requested Prescriptions  Pending Prescriptions Disp Refills   TRULICITY 0.75 MG/0.5ML SOPN [Pharmacy Med Name: TRULICITY 0.75 MG/0.5ML SUBCUTANEOUS SOLUTION PEN-INJECTOR] 3 mL 3    Sig: INJECT 0.75 MG INTO THE SKIN ONCE A WEEK.     Endocrinology:  Diabetes - GLP-1 Receptor Agonists Passed - 06/09/2022  2:02 PM      Passed - HBA1C is between 0 and 7.9 and within 180 days    HbA1c, POC (controlled diabetic range)  Date Value Ref Range Status  06/03/2022 6.3 0.0 - 7.0 % Final         Passed - Valid encounter within last 6 months    Recent Outpatient Visits           6 days ago Diabetes mellitus without complication   [place] [facility] Sessions, NP   6 months ago Diabetes mellitus without complication [facility])   [place] [facility], NP   9 months ago Diabetes mellitus without complication [facility])   [place] [facility] Sessions, NP   1 year ago Diabetes mellitus without complication [facility])   [place] [facility] Sessions, NP   2 years ago Diabetes mellitus without complication [facility])   [place] [facility] Sessions, NP

## 2022-06-09 NOTE — Telephone Encounter (Signed)
Contacted pt to go over lab results pt is aware and doesn't have any questions or concerns 

## 2022-07-20 ENCOUNTER — Other Ambulatory Visit (INDEPENDENT_AMBULATORY_CARE_PROVIDER_SITE_OTHER): Payer: Self-pay | Admitting: Primary Care

## 2022-07-21 NOTE — Telephone Encounter (Signed)
Requested Prescriptions  Pending Prescriptions Disp Refills   metFORMIN (GLUCOPHAGE-XR) 500 MG 24 hr tablet [Pharmacy Med Name: METFORMIN HCL ER 500 MG ORAL TABLET EXTENDED RELEASE 24 HOUR] 90 tablet 0    Sig: TAKE 1 TABLET (500 MG TOTAL) BY MOUTH DAILY WITH BREAKFAST.     Endocrinology:  Diabetes - Biguanides Failed - 07/20/2022 12:40 PM      Failed - B12 Level in normal range and within 720 days    Vitamin B-12  Date Value Ref Range Status  10/11/2017 >2000 (H) 232 - 1245 pg/mL Final         Failed - CBC within normal limits and completed in the last 12 months    WBC  Date Value Ref Range Status  10/14/2020 10.6 3.4 - 10.8 x10E3/uL Final  07/22/2019 10.6 (H) 4.0 - 10.5 K/uL Final   RBC  Date Value Ref Range Status  10/14/2020 5.14 4.14 - 5.80 x10E6/uL Final  07/22/2019 5.03 4.22 - 5.81 MIL/uL Final   Hemoglobin  Date Value Ref Range Status  10/14/2020 14.8 13.0 - 17.7 g/dL Final   Hematocrit  Date Value Ref Range Status  10/14/2020 45.5 37.5 - 51.0 % Final   MCHC  Date Value Ref Range Status  10/14/2020 32.5 31.5 - 35.7 g/dL Final  16/11/9602 54.0 30.0 - 36.0 g/dL Final   [facility]  Date Value Ref Range Status  10/14/2020 28.8 26.6 - 33.0 pg Final  07/22/2019 28.2 26.0 - 34.0 pg Final   MCV  Date Value Ref Range Status  10/14/2020 89 79 - 97 fL Final   No results found for: "PLTCOUNTKUC", "LABPLAT", "POCPLA" RDW  Date Value Ref Range Status  10/14/2020 11.5 (L) 11.6 - 15.4 % Final         Passed - Cr in normal range and within 360 days    Creatinine, Ser  Date Value Ref Range Status  06/03/2022 0.86 0.76 - 1.27 mg/dL Final         Passed - HBA1C is between 0 and 7.9 and within 180 days    HbA1c, POC (controlled diabetic range)  Date Value Ref Range Status  06/03/2022 6.3 0.0 - 7.0 % Final         Passed - eGFR in normal range and within 360 days    GFR calc Af Amer  Date Value Ref Range Status  07/22/2019 >60 >60 mL/min Final   GFR calc non Af Amer   Date Value Ref Range Status  07/22/2019 >60 >60 mL/min Final   eGFR  Date Value Ref Range Status  06/03/2022 102 >59 mL/min/1.73 Final         Passed - Valid encounter within last 6 months    Recent Outpatient Visits           1 month ago Diabetes mellitus without complication (HCC)   [place] [facility] Sessions, NP   7 months ago Diabetes mellitus without complication [facility])   [place] [facility] Sessions, NP   10 months ago Diabetes mellitus without complication [facility])   [place] [facility] Sessions, NP   1 year ago Diabetes mellitus without complication [facility])   [place] [facility] Sessions, NP   2 years ago Diabetes mellitus without complication [facility])   [place] [facility] Sessions, NP               omeprazole (PRILOSEC) 20  MG capsule [Pharmacy Med Name: OMEPRAZOLE 20 MG ORAL CAPSULE DELAYED RELEASE] 90 capsule 0    Sig: TAKE 1 CAPSULE (20 MG TOTAL) BY MOUTH DAILY.     Gastroenterology: Proton Pump Inhibitors Passed - 07/20/2022 12:40 PM      Passed - Valid encounter within last 12 months    Recent Outpatient Visits           1 month ago Diabetes mellitus without complication (HCC)   [place] [facility] Sessions, NP   7 months ago Diabetes mellitus without complication [facility])   [place] [facility] Sessions, NP   10 months ago Diabetes mellitus without complication [facility])   [place] [facility] Sessions, NP   1 year ago Diabetes mellitus without complication [facility])   [place] [facility] Sessions, NP   2 years ago Diabetes mellitus without complication [facility])   [place] [facility] Sessions, NP

## 2022-07-29 ENCOUNTER — Other Ambulatory Visit (INDEPENDENT_AMBULATORY_CARE_PROVIDER_SITE_OTHER): Payer: Self-pay | Admitting: Primary Care

## 2022-07-29 DIAGNOSIS — I10 Essential (primary) hypertension: Secondary | ICD-10-CM

## 2022-07-30 ENCOUNTER — Other Ambulatory Visit (INDEPENDENT_AMBULATORY_CARE_PROVIDER_SITE_OTHER): Payer: Self-pay

## 2022-07-30 DIAGNOSIS — I10 Essential (primary) hypertension: Secondary | ICD-10-CM

## 2022-07-30 MED ORDER — LOSARTAN POTASSIUM 25 MG PO TABS
25.0000 mg | ORAL_TABLET | Freq: Every day | ORAL | 1 refills | Status: DC
Start: 2022-07-30 — End: 2023-01-20

## 2022-08-04 ENCOUNTER — Other Ambulatory Visit (INDEPENDENT_AMBULATORY_CARE_PROVIDER_SITE_OTHER): Payer: Self-pay | Admitting: Primary Care

## 2022-08-04 DIAGNOSIS — I10 Essential (primary) hypertension: Secondary | ICD-10-CM

## 2022-08-17 ENCOUNTER — Other Ambulatory Visit (INDEPENDENT_AMBULATORY_CARE_PROVIDER_SITE_OTHER): Payer: Self-pay | Admitting: Primary Care

## 2022-08-17 DIAGNOSIS — G8929 Other chronic pain: Secondary | ICD-10-CM

## 2022-08-18 NOTE — Telephone Encounter (Signed)
Requested Prescriptions  Pending Prescriptions Disp Refills   gabapentin (NEURONTIN) 600 MG tablet [Pharmacy Med Name: GABAPENTIN 600 MG ORAL TABLET] 540 tablet 0    Sig: TAKE 2 TABLETS (1,200 MG TOTAL) BY MOUTH 3 (THREE) TIMES DAILY.     Neurology: Anticonvulsants - gabapentin Passed - 08/17/2022  2:08 PM      Passed - Cr in normal range and within 360 days    Creatinine, Ser  Date Value Ref Range Status  06/03/2022 0.86 0.76 - 1.27 mg/dL Final         Passed - Completed PHQ-2 or PHQ-9 in the last 360 days      Passed - Valid encounter within last 12 months    Recent Outpatient Visits           2 months ago Diabetes mellitus without complication (HCC)   [place] [facility] Sessions, NP   8 months ago Diabetes mellitus without complication [facility])   [place] [facility] Sessions, NP   11 months ago Diabetes mellitus without complication [facility])   [place] [facility] Sessions, NP   1 year ago Diabetes mellitus without complication [facility])   [place] [facility] Sessions, NP   2 years ago Diabetes mellitus without complication [facility])   [place] [facility] Sessions, NP

## 2022-10-05 ENCOUNTER — Other Ambulatory Visit (INDEPENDENT_AMBULATORY_CARE_PROVIDER_SITE_OTHER): Payer: Self-pay | Admitting: Primary Care

## 2022-10-07 NOTE — Telephone Encounter (Signed)
Requested Prescriptions  Pending Prescriptions Disp Refills   tadalafil (CIALIS) 10 MG tablet [Pharmacy Med Name: TADALAFIL 10 MG ORAL TABLET] 10 tablet 1    Sig: TAKE 1 TABLET (10 MG TOTAL) BY MOUTH EVERY OTHER DAY AS NEEDED FOR ERECTILE DYSFUNCTION.     Urology: Erectile Dysfunction Agents Passed - 10/05/2022  3:44 PM      Passed - AST in normal range and within 360 days    AST  Date Value Ref Range Status  06/03/2022 30 0 - 40 IU/L Final         Passed - ALT in normal range and within 360 days    ALT  Date Value Ref Range Status  06/03/2022 44 0 - 44 IU/L Final         Passed - Last BP in normal range    BP Readings from Last 1 Encounters:  06/03/22 123/83         Passed - Valid encounter within last 12 months    Recent Outpatient Visits           4 months ago Diabetes mellitus without complication (HCC)   [place] [facility] Sessions, NP   10 months ago Diabetes mellitus without complication [facility])   [place] [facility] Sessions, NP   1 year ago Diabetes mellitus without complication [facility])   [place] [facility] Sessions, NP   1 year ago Diabetes mellitus without complication [facility])   [place] [facility] Sessions, NP   2 years ago Diabetes mellitus without complication [facility])   [place] [facility] Sessions, NP

## 2022-10-12 ENCOUNTER — Other Ambulatory Visit (INDEPENDENT_AMBULATORY_CARE_PROVIDER_SITE_OTHER): Payer: Self-pay | Admitting: Primary Care

## 2022-10-12 DIAGNOSIS — E119 Type 2 diabetes mellitus without complications: Secondary | ICD-10-CM

## 2022-10-13 NOTE — Telephone Encounter (Signed)
Pravastatin- Rx 06/08/22 #90 1RF- too soon Requested Prescriptions  Pending Prescriptions Disp Refills   TRULICITY 0.75 MG/0.5ML SOPN [Pharmacy Med Name: TRULICITY 0.75 MG/0.5ML SUBCUTANEOUS SOLUTION PEN-INJECTOR] 2 mL     Sig: INJECT 0.75 MG INTO THE SKIN ONCE A WEEK.     Endocrinology:  Diabetes - GLP-1 Receptor Agonists Passed - 10/12/2022 12:59 PM      Passed - HBA1C is between 0 and 7.9 and within 180 days    HbA1c, POC (controlled diabetic range)  Date Value Ref Range Status  06/03/2022 6.3 0.0 - 7.0 % Final         Passed - Valid encounter within last 6 months    Recent Outpatient Visits           4 months ago Diabetes mellitus without complication (HCC)   [place] [facility] Sessions, NP   10 months ago Diabetes mellitus without complication [facility])   [place] [facility] Sessions, NP   1 year ago Diabetes mellitus without complication [facility])   [place] [facility] Sessions, NP   1 year ago Diabetes mellitus without complication [facility])   [place] [facility], NP   2 years ago Diabetes mellitus without complication (HCC)   [place] [facility] Sessions, NP               pravastatin (PRAVACHOL) 10 MG tablet [Pharmacy Med Name: PRAVASTATIN SODIUM 10 MG ORAL TABLET] 90 tablet 1    Sig: TAKE 1 TABLET (10 MG TOTAL) BY MOUTH DAILY.     Cardiovascular:  Antilipid - Statins Failed - 10/12/2022 12:59 PM      Failed - Lipid Panel in normal range within the last 12 months    Cholesterol, Total  Date Value Ref Range Status  06/03/2022 169 100 - 199 mg/dL Final   LDL Chol Calc (NIH)  Date Value Ref Range Status  06/03/2022 97 0 - 99 mg/dL Final   HDL  Date Value Ref Range Status  06/03/2022 60 >39 mg/dL Final   Triglycerides  Date Value Ref Range Status  06/03/2022 64 0 - 149 mg/dL Final         Passed - Patient  is not pregnant      Passed - Valid encounter within last 12 months    Recent Outpatient Visits           4 months ago Diabetes mellitus without complication (HCC)   [place] [facility] Sessions, NP   10 months ago Diabetes mellitus without complication [facility])   [place] [facility] Sessions, NP   1 year ago Diabetes mellitus without complication [facility])   [place] [facility] Sessions, NP   1 year ago Diabetes mellitus without complication [facility])   [place] [facility] Sessions, NP   2 years ago Diabetes mellitus without complication [facility])   [place] [facility] Sessions, NP

## 2022-10-16 ENCOUNTER — Other Ambulatory Visit (INDEPENDENT_AMBULATORY_CARE_PROVIDER_SITE_OTHER): Payer: Self-pay | Admitting: Primary Care

## 2022-10-16 NOTE — Telephone Encounter (Signed)
Requested Prescriptions  Pending Prescriptions Disp Refills   omeprazole (PRILOSEC) 20 MG capsule [Pharmacy Med Name: OMEPRAZOLE 20 MG ORAL CAPSULE DELAYED RELEASE] 90 capsule 0    Sig: TAKE 1 CAPSULE (20 MG TOTAL) BY MOUTH DAILY.     Gastroenterology: Proton Pump Inhibitors Passed - 10/16/2022  2:05 PM      Passed - Valid encounter within last 12 months    Recent Outpatient Visits           4 months ago Diabetes mellitus without complication (HCC)   [place] [facility] Sessions, NP   10 months ago Diabetes mellitus without complication [facility])   [place] [facility]ce Sessions, NP   1 year ago Diabetes mellitus without complication [facility])   [place] [facility] Sessions, NP   2 years ago Diabetes mellitus without complication [facility])   [place] [facility] Sessions, NP   2 years ago Diabetes mellitus without complication [facility])   [place] [facility] Sessions, NP

## 2022-10-30 ENCOUNTER — Other Ambulatory Visit (INDEPENDENT_AMBULATORY_CARE_PROVIDER_SITE_OTHER): Payer: Self-pay | Admitting: Primary Care

## 2022-10-30 DIAGNOSIS — I10 Essential (primary) hypertension: Secondary | ICD-10-CM

## 2022-12-11 ENCOUNTER — Other Ambulatory Visit (INDEPENDENT_AMBULATORY_CARE_PROVIDER_SITE_OTHER): Payer: Self-pay

## 2022-12-11 DIAGNOSIS — E119 Type 2 diabetes mellitus without complications: Secondary | ICD-10-CM

## 2022-12-21 MED ORDER — TRULICITY 0.75 MG/0.5ML ~~LOC~~ SOAJ
0.7500 mg | SUBCUTANEOUS | 0 refills | Status: DC
Start: 2022-12-21 — End: 2023-02-03

## 2022-12-21 NOTE — Telephone Encounter (Signed)
Need appt

## 2022-12-21 NOTE — Telephone Encounter (Signed)
Please schedule pt an appt

## 2023-01-04 ENCOUNTER — Other Ambulatory Visit (INDEPENDENT_AMBULATORY_CARE_PROVIDER_SITE_OTHER): Payer: Self-pay | Admitting: Primary Care

## 2023-01-04 DIAGNOSIS — G4700 Insomnia, unspecified: Secondary | ICD-10-CM

## 2023-01-05 ENCOUNTER — Telehealth: Payer: Self-pay | Admitting: Pharmacist

## 2023-01-05 ENCOUNTER — Other Ambulatory Visit: Payer: Self-pay | Admitting: Pharmacist

## 2023-01-05 MED ORDER — PRAVASTATIN SODIUM 10 MG PO TABS: 10.0000 mg | ORAL_TABLET | Freq: Every day | ORAL | 0 refills | Status: DC

## 2023-01-05 NOTE — Telephone Encounter (Signed)
Hey friend,   Doing some medication adherence stuff for [employer]. Pt is needing a pravastatin refill but has not been seen since April. I went ahead and sent a 30-day supply. Can y'all call and get him on Michelle's schedule for a visit?

## 2023-01-05 NOTE — Telephone Encounter (Signed)
Please contact pt and schedule an appt per [facility]

## 2023-01-05 NOTE — Progress Notes (Signed)
Pharmacy Quality Measure Review  This patient is appearing on a report for being at risk of failing the adherence measure for cholesterol (statin) medications this calendar year.   Medication: pravastatin  Last fill date: 10/12/22 for 90 day supply  Contacted pharmacy to facilitate refills. Pt is overdue for an appt. Rxn sent for a 30-day supply. Sent message to patient's PCP to get him scheduled for an appt.   Butch Penny, PharmD, [place]y [place], CPP Clinical Pharmacist [facility] [phone]

## 2023-01-11 ENCOUNTER — Other Ambulatory Visit (INDEPENDENT_AMBULATORY_CARE_PROVIDER_SITE_OTHER): Payer: Self-pay | Admitting: Primary Care

## 2023-01-11 DIAGNOSIS — I10 Essential (primary) hypertension: Secondary | ICD-10-CM

## 2023-01-12 ENCOUNTER — Other Ambulatory Visit (INDEPENDENT_AMBULATORY_CARE_PROVIDER_SITE_OTHER): Payer: Self-pay | Admitting: Primary Care

## 2023-01-12 DIAGNOSIS — I10 Essential (primary) hypertension: Secondary | ICD-10-CM

## 2023-01-18 ENCOUNTER — Other Ambulatory Visit (INDEPENDENT_AMBULATORY_CARE_PROVIDER_SITE_OTHER): Payer: Self-pay | Admitting: Primary Care

## 2023-01-18 DIAGNOSIS — I10 Essential (primary) hypertension: Secondary | ICD-10-CM

## 2023-01-20 ENCOUNTER — Other Ambulatory Visit (INDEPENDENT_AMBULATORY_CARE_PROVIDER_SITE_OTHER): Payer: Self-pay

## 2023-01-20 DIAGNOSIS — I10 Essential (primary) hypertension: Secondary | ICD-10-CM

## 2023-01-20 MED ORDER — METFORMIN HCL ER 500 MG PO TB24: 500.0000 mg | ORAL_TABLET | Freq: Every day | ORAL | 0 refills | Status: DC

## 2023-01-20 MED ORDER — LOSARTAN POTASSIUM 25 MG PO TABS: 25.0000 mg | ORAL_TABLET | Freq: Every day | ORAL | 0 refills | Status: DC

## 2023-01-21 ENCOUNTER — Ambulatory Visit: Payer: 59 | Admitting: Pharmacist

## 2023-01-21 ENCOUNTER — Other Ambulatory Visit: Payer: Self-pay

## 2023-01-28 ENCOUNTER — Ambulatory Visit (INDEPENDENT_AMBULATORY_CARE_PROVIDER_SITE_OTHER): Payer: 59 | Admitting: Primary Care

## 2023-02-03 ENCOUNTER — Other Ambulatory Visit (INDEPENDENT_AMBULATORY_CARE_PROVIDER_SITE_OTHER): Payer: Self-pay | Admitting: Primary Care

## 2023-02-03 DIAGNOSIS — G4700 Insomnia, unspecified: Secondary | ICD-10-CM

## 2023-02-03 DIAGNOSIS — E119 Type 2 diabetes mellitus without complications: Secondary | ICD-10-CM

## 2023-02-05 ENCOUNTER — Other Ambulatory Visit (INDEPENDENT_AMBULATORY_CARE_PROVIDER_SITE_OTHER): Payer: Self-pay | Admitting: Primary Care

## 2023-02-05 DIAGNOSIS — G4700 Insomnia, unspecified: Secondary | ICD-10-CM

## 2023-02-05 DIAGNOSIS — E119 Type 2 diabetes mellitus without complications: Secondary | ICD-10-CM

## 2023-02-05 DIAGNOSIS — G8929 Other chronic pain: Secondary | ICD-10-CM

## 2023-02-05 DIAGNOSIS — I10 Essential (primary) hypertension: Secondary | ICD-10-CM

## 2023-02-05 NOTE — Telephone Encounter (Signed)
[facility] called and spoke to [facility] about the refill(s) duloxetine and trulicity requested. Advised it was sent on 02/03/23. She says he picked them up yesterday, but he has no more refills left on those as well as he needs refills on amlodipine, gabapentin, losartan, metformin, omeprazole, and pravastatin. Advised they will be sent in.

## 2023-02-05 NOTE — Telephone Encounter (Signed)
Medication Refill -  Most Recent Primary Care Visit:  Provider: Grayce Sessions  Department: [facility]  Visit Type: OFFICE VISIT  Date: 06/03/2022  Medication:  DULoxetine (CYMBALTA) 60 MG capsule TRULICITY 0.75 MG/0.5ML SOAJ  Has the patient contacted their pharmacy? Yes (Agent: If no, request that the patient contact the pharmacy for the refill. If patient does not wish to contact the pharmacy document the reason why and proceed with request.) (Agent: If yes, when and what did the pharmacy advise?)  Is this the correct pharmacy for this prescription? Yes  [facility] - [place], [place] - [address] [place] [address] Phone: [phone] Fax: [phone]   Has the prescription been filled recently? Yes                They stated to call provider  Is the patient out of the medication? Yes  Has the patient been seen for an appointment in the last year OR does the patient have an upcoming appointment? Yes  Can we respond through MyChart? No  Agent: Please be advised that Rx refills may take up to 3 business days. We ask that you follow-up with your pharmacy.

## 2023-02-08 ENCOUNTER — Ambulatory Visit (INDEPENDENT_AMBULATORY_CARE_PROVIDER_SITE_OTHER): Payer: 59 | Admitting: Primary Care

## 2023-02-08 MED ORDER — METFORMIN HCL ER 500 MG PO TB24: 500.0000 mg | ORAL_TABLET | Freq: Every day | ORAL | 0 refills | Status: DC

## 2023-02-08 MED ORDER — PRAVASTATIN SODIUM 10 MG PO TABS: 10.0000 mg | ORAL_TABLET | Freq: Every day | ORAL | 0 refills | Status: DC

## 2023-02-08 MED ORDER — OMEPRAZOLE 20 MG PO CPDR: 20.0000 mg | DELAYED_RELEASE_CAPSULE | Freq: Every day | ORAL | 0 refills | Status: DC

## 2023-02-08 MED ORDER — GABAPENTIN 600 MG PO TABS: 1200.0000 mg | ORAL_TABLET | Freq: Three times a day (TID) | ORAL | 0 refills | Status: DC

## 2023-02-08 MED ORDER — LOSARTAN POTASSIUM 25 MG PO TABS: 25.0000 mg | ORAL_TABLET | Freq: Every day | ORAL | 0 refills | Status: DC

## 2023-02-08 NOTE — Telephone Encounter (Signed)
Patient has appointment today- did refill Rx that fall into the 1 year protocol. Courtesy 30 day given for those in 6 month protocol Requested Prescriptions  Pending Prescriptions Disp Refills   pravastatin (PRAVACHOL) 10 MG tablet 30 tablet 0    Sig: Take 1 tablet (10 mg total) by mouth daily.     Cardiovascular:  Antilipid - Statins Failed - 02/08/2023 11:12 AM      Failed - Lipid Panel in normal range within the last 12 months    Cholesterol, Total  Date Value Ref Range Status  06/03/2022 169 100 - 199 mg/dL Final   LDL Chol Calc (NIH)  Date Value Ref Range Status  06/03/2022 97 0 - 99 mg/dL Final   HDL  Date Value Ref Range Status  06/03/2022 60 >39 mg/dL Final   Triglycerides  Date Value Ref Range Status  06/03/2022 64 0 - 149 mg/dL Final         Passed - Patient is not pregnant      Passed - Valid encounter within last 12 months    Recent Outpatient Visits           8 months ago Diabetes mellitus without complication (HCC)   [place] [facility] Grayce Sessions, NP   1 year ago Diabetes mellitus without complication (HCC)   [place] [facility] Grayce Sessions, NP   1 year ago Diabetes mellitus without complication [facility])   [place] [facility] Grayce Sessions, NP   2 years ago Diabetes mellitus without complication [facility])   [place] [facility] Grayce Sessions, NP   2 years ago Diabetes mellitus without complication [facility])   [place] [facility] Grayce Sessions, NP       Future Appointments             Today Grayce Sessions, NP [place] [facility]             gabapentin (NEURONTIN) 600 MG tablet 540 tablet 0    Sig: Take 2 tablets (1,200 mg total) by mouth 3 (three) times daily.     Neurology: Anticonvulsants - gabapentin Passed - 02/08/2023 11:12 AM      Passed - Cr in normal range and within 360 days     Creatinine, Ser  Date Value Ref Range Status  06/03/2022 0.86 0.76 - 1.27 mg/dL Final         Passed - Completed PHQ-2 or PHQ-9 in the last 360 days      Passed - Valid encounter within last 12 months    Recent Outpatient Visits           8 months ago Diabetes mellitus without complication (HCC)   [place] [facility] Grayce Sessions, NP   1 year ago Diabetes mellitus without complication [facility])   [place] [facility] Grayce Sessions, NP   1 year ago Diabetes mellitus without complication [facility])   [place] [facility] Grayce Sessions, NP   2 years ago Diabetes mellitus without complication [facility])   [place] [facility] Grayce Sessions, NP   2 years ago Diabetes mellitus without complication [facility])   [place] [facility] Grayce Sessions, NP       Future Appointments             Today Grayce Sessions, NP [place] [facility]  metFORMIN (GLUCOPHAGE-XR) 500 MG 24 hr tablet 30 tablet 0    Sig: Take 1 tablet (500 mg total) by mouth daily with breakfast.     Endocrinology:  Diabetes - Biguanides Failed - 02/08/2023 11:12 AM      Failed - HBA1C is between 0 and 7.9 and within 180 days    HbA1c, POC (controlled diabetic range)  Date Value Ref Range Status  06/03/2022 6.3 0.0 - 7.0 % Final         Failed - B12 Level in normal range and within 720 days    Vitamin B-12  Date Value Ref Range Status  10/11/2017 >2000 (H) 232 - 1245 pg/mL Final         Failed - Valid encounter within last 6 months    Recent Outpatient Visits           8 months ago Diabetes mellitus without complication (HCC)   [place] [facility] Grayce Sessions, NP   1 year ago Diabetes mellitus without complication (HCC)   [place] [facility] Grayce Sessions, NP   1 year ago Diabetes mellitus without complication  (HCC)   [place] [facility] Grayce Sessions, NP   2 years ago Diabetes mellitus without complication (HCC)   [place] [facility] Grayce Sessions, NP   2 years ago Diabetes mellitus without complication (HCC)   [place] [facility] Grayce Sessions, NP       Future Appointments             Today Grayce Sessions, NP  [facility]            Failed - CBC within normal limits and completed in the last 12 months    WBC  Date Value Ref Range Status  10/14/2020 10.6 3.4 - 10.8 x10E3/uL Final  07/22/2019 10.6 (H) 4.0 - 10.5 K/uL Final   RBC  Date Value Ref Range Status  10/14/2020 5.14 4.14 - 5.80 x10E6/uL Final  07/22/2019 5.03 4.22 - 5.81 MIL/uL Final   Hemoglobin  Date Value Ref Range Status  10/14/2020 14.8 13.0 - 17.7 g/dL Final   Hematocrit  Date Value Ref Range Status  10/14/2020 45.5 37.5 - 51.0 % Final   MCHC  Date Value Ref Range Status  10/14/2020 32.5 31.5 - 35.7 g/dL Final  02/72/5366 44.0 30.0 - 36.0 g/dL Final   [facility]  Date Value Ref Range Status  10/14/2020 28.8 26.6 - 33.0 pg Final  07/22/2019 28.2 26.0 - 34.0 pg Final   MCV  Date Value Ref Range Status  10/14/2020 89 79 - 97 fL Final   No results found for: "PLTCOUNTKUC", "LABPLAT", "POCPLA" RDW  Date Value Ref Range Status  10/14/2020 11.5 (L) 11.6 - 15.4 % Final         Passed - Cr in normal range and within 360 days    Creatinine, Ser  Date Value Ref Range Status  06/03/2022 0.86 0.76 - 1.27 mg/dL Final         Passed - eGFR in normal range and within 360 days    GFR calc Af Amer  Date Value Ref Range Status  07/22/2019 >60 >60 mL/min Final   GFR calc non Af Amer  Date Value Ref Range Status  07/22/2019 >60 >60 mL/min Final   eGFR  Date Value Ref Range Status  06/03/2022 102 >59 mL/min/1.73 Final  losartan (COZAAR) 25 MG tablet 30 tablet 0    Sig: Take 1 tablet (25 mg  total) by mouth daily.     Cardiovascular:  Angiotensin Receptor Blockers Failed - 02/08/2023 11:12 AM      Failed - Cr in normal range and within 180 days    Creatinine, Ser  Date Value Ref Range Status  06/03/2022 0.86 0.76 - 1.27 mg/dL Final         Failed - K in normal range and within 180 days    Potassium  Date Value Ref Range Status  06/03/2022 4.2 3.5 - 5.2 mmol/L Final         Failed - Valid encounter within last 6 months    Recent Outpatient Visits           8 months ago Diabetes mellitus without complication (HCC)   [place] [facility] Grayce Sessions, NP   1 year ago Diabetes mellitus without complication [facility])   [place] [facility] Grayce Sessions, NP   1 year ago Diabetes mellitus without complication [facility])   [place] [facility] Grayce Sessions, NP   2 years ago Diabetes mellitus without complication [facility])   [place] [facility] Grayce Sessions, NP   2 years ago Diabetes mellitus without complication [facility])   [place] [facility] Grayce Sessions, NP       Future Appointments             Today Grayce Sessions, NP [place] [facility]            Passed - Patient is not pregnant      Passed - Last BP in normal range    BP Readings from Last 1 Encounters:  06/03/22 123/83         Refused Prescriptions Disp Refills   omeprazole (PRILOSEC) 20 MG capsule 90 capsule 0    Sig: Take 1 capsule (20 mg total) by mouth daily.     Gastroenterology: Proton Pump Inhibitors Passed - 02/08/2023 11:12 AM      Passed - Valid encounter within last 12 months    Recent Outpatient Visits           8 months ago Diabetes mellitus without complication (HCC)   [place] [facility] Grayce Sessions, NP   1 year ago Diabetes mellitus without complication [facility])   [place] [facility] Grayce Sessions, NP   1 year ago Diabetes mellitus without complication [facility])   [place] [facility] Grayce Sessions, NP   2 years ago Diabetes mellitus without complication [facility])   [place] [facility] Grayce Sessions, NP   2 years ago Diabetes mellitus without complication [facility])   [place] [facility] Grayce Sessions, NP       Future Appointments             Today Grayce Sessions, NP [place] [facility]             DULoxetine (CYMBALTA) 60 MG capsule 30 capsule 0    Sig: TAKE 1 (ONE) CAPSULE (60 MG TOTAL) BY MOUTH DAILY.     Psychiatry: Antidepressants - SNRI - duloxetine Failed - 02/08/2023 11:12 AM      Failed - Valid encounter within last 6 months    Recent Outpatient Visits  8 months ago Diabetes mellitus without complication [facility])   [place] [facility] Grayce Sessions, NP   1 year ago Diabetes mellitus without complication [facility])   [place] [facility] Grayce Sessions, NP   1 year ago Diabetes mellitus without complication [facility])   [place] [facility] Grayce Sessions, NP   2 years ago Diabetes mellitus without complication (HCC)   [place] [facility] Grayce Sessions, NP   2 years ago Diabetes mellitus without complication [facility])   [place] [facility] Grayce Sessions, NP       Future Appointments             Today Grayce Sessions, NP [place] [facility]            Passed - Cr in normal range and within 360 days    Creatinine, Ser  Date Value Ref Range Status  06/03/2022 0.86 0.76 - 1.27 mg/dL Final         Passed - eGFR is 30 or above and within 360 days    GFR calc Af Amer  Date Value Ref Range Status  07/22/2019 >60 >60 mL/min Final   GFR calc non Af Amer  Date Value Ref Range Status  07/22/2019 >60 >60 mL/min  Final   eGFR  Date Value Ref Range Status  06/03/2022 102 >59 mL/min/1.73 Final         Passed - Completed PHQ-2 or PHQ-9 in the last 360 days      Passed - Last BP in normal range    BP Readings from Last 1 Encounters:  06/03/22 123/83          Dulaglutide (TRULICITY) 0.75 MG/0.5ML SOAJ 2 mL 0    Sig: Inject 0.75 mg into the skin once a week.     Endocrinology:  Diabetes - GLP-1 Receptor Agonists Failed - 02/08/2023 11:12 AM      Failed - HBA1C is between 0 and 7.9 and within 180 days    HbA1c, POC (controlled diabetic range)  Date Value Ref Range Status  06/03/2022 6.3 0.0 - 7.0 % Final         Failed - Valid encounter within last 6 months    Recent Outpatient Visits           8 months ago Diabetes mellitus without complication (HCC)   [place] [facility] Grayce Sessions, NP   1 year ago Diabetes mellitus without complication [facility])   [place] [facility] Grayce Sessions, NP   1 year ago Diabetes mellitus without complication [facility])   [place] [facility] Grayce Sessions, NP   2 years ago Diabetes mellitus without complication [facility])   [place] [facility] Grayce Sessions, NP   2 years ago Diabetes mellitus without complication [facility])   [place] [facility] Grayce Sessions, NP       Future Appointments             Today Grayce Sessions, NP [place] [facility]

## 2023-02-23 ENCOUNTER — Encounter (INDEPENDENT_AMBULATORY_CARE_PROVIDER_SITE_OTHER): Payer: Self-pay | Admitting: Primary Care

## 2023-02-23 ENCOUNTER — Ambulatory Visit (INDEPENDENT_AMBULATORY_CARE_PROVIDER_SITE_OTHER): Payer: 59 | Admitting: Primary Care

## 2023-02-23 VITALS — BP 140/88 | HR 107 | Resp 16 | Ht 69.0 in | Wt 176.6 lb

## 2023-02-23 DIAGNOSIS — E119 Type 2 diabetes mellitus without complications: Secondary | ICD-10-CM

## 2023-02-23 DIAGNOSIS — G4700 Insomnia, unspecified: Secondary | ICD-10-CM | POA: Diagnosis not present

## 2023-02-23 DIAGNOSIS — Z794 Long term (current) use of insulin: Secondary | ICD-10-CM

## 2023-02-23 DIAGNOSIS — I10 Essential (primary) hypertension: Secondary | ICD-10-CM

## 2023-02-23 DIAGNOSIS — G8929 Other chronic pain: Secondary | ICD-10-CM

## 2023-02-23 DIAGNOSIS — Z7984 Long term (current) use of oral hypoglycemic drugs: Secondary | ICD-10-CM

## 2023-02-23 DIAGNOSIS — Z760 Encounter for issue of repeat prescription: Secondary | ICD-10-CM

## 2023-02-23 DIAGNOSIS — M25561 Pain in right knee: Secondary | ICD-10-CM | POA: Diagnosis not present

## 2023-02-23 DIAGNOSIS — Z2821 Immunization not carried out because of patient refusal: Secondary | ICD-10-CM

## 2023-02-23 LAB — POCT GLYCOSYLATED HEMOGLOBIN (HGB A1C): HbA1c, POC (controlled diabetic range): 6.3 % (ref 0.0–7.0)

## 2023-02-23 MED ORDER — TADALAFIL 20 MG PO TABS: 20.0000 mg | ORAL_TABLET | ORAL | 3 refills | Status: DC | PRN

## 2023-02-23 MED ORDER — TRULICITY 0.75 MG/0.5ML ~~LOC~~ SOAJ: 0.7500 mg | SUBCUTANEOUS | 3 refills | Status: DC

## 2023-02-23 MED ORDER — DULOXETINE HCL 60 MG PO CPEP: ORAL_CAPSULE | ORAL | 1 refills | Status: DC

## 2023-02-23 MED ORDER — METFORMIN HCL ER 500 MG PO TB24: 500.0000 mg | ORAL_TABLET | Freq: Every day | ORAL | 1 refills | Status: DC

## 2023-02-23 MED ORDER — GABAPENTIN 600 MG PO TABS: 1200.0000 mg | ORAL_TABLET | Freq: Three times a day (TID) | ORAL | 1 refills | Status: AC

## 2023-02-23 MED ORDER — LOSARTAN POTASSIUM 25 MG PO TABS
25.0000 mg | ORAL_TABLET | Freq: Every day | ORAL | 1 refills | Status: DC
Start: 2023-02-23 — End: 2023-08-06

## 2023-02-24 LAB — CMP14+EGFR
ALT: 33 [IU]/L (ref 0–44)
AST: 25 [IU]/L (ref 0–40)
Albumin: 4.4 g/dL (ref 3.8–4.9)
Alkaline Phosphatase: 142 [IU]/L — ABNORMAL HIGH (ref 44–121)
BUN/Creatinine Ratio: 14 (ref 9–20)
BUN: 12 mg/dL (ref 6–24)
Bilirubin Total: 0.3 mg/dL (ref 0.0–1.2)
CO2: 21 mmol/L (ref 20–29)
Calcium: 9.5 mg/dL (ref 8.7–10.2)
Chloride: 104 mmol/L (ref 96–106)
Creatinine, Ser: 0.83 mg/dL (ref 0.76–1.27)
Globulin, Total: 3.1 g/dL (ref 1.5–4.5)
Glucose: 157 mg/dL — ABNORMAL HIGH (ref 70–99)
Potassium: 4.4 mmol/L (ref 3.5–5.2)
Sodium: 141 mmol/L (ref 134–144)
Total Protein: 7.5 g/dL (ref 6.0–8.5)
eGFR: 102 mL/min/{1.73_m2} (ref >59)

## 2023-02-24 LAB — CBC WITH DIFFERENTIAL/PLATELET
Basophils Absolute: 0.1 10*3/uL (ref 0.0–0.2)
Basos: 0 %
EOS (ABSOLUTE): 0.1 10*3/uL (ref 0.0–0.4)
Eos: 1 %
Hematocrit: 46.3 % (ref 37.5–51.0)
Hemoglobin: 14.9 g/dL (ref 13.0–17.7)
Immature Grans (Abs): 0 10*3/uL (ref 0.0–0.1)
Immature Granulocytes: 0 %
Lymphocytes Absolute: 2.4 10*3/uL (ref 0.7–3.1)
Lymphs: 20 %
MCH: 28.9 pg (ref 26.6–33.0)
MCHC: 32.2 g/dL (ref 31.5–35.7)
MCV: 90 fL (ref 79–97)
Monocytes Absolute: 0.9 10*3/uL (ref 0.1–0.9)
Monocytes: 8 %
Neutrophils Absolute: 8.5 10*3/uL — ABNORMAL HIGH (ref 1.4–7.0)
Neutrophils: 71 %
Platelets: 448 10*3/uL (ref 150–450)
RBC: 5.16 x10E6/uL (ref 4.14–5.80)
RDW: 11.6 % (ref 11.6–15.4)
WBC: 11.9 10*3/uL — ABNORMAL HIGH (ref 3.4–10.8)

## 2023-02-24 LAB — LIPID PANEL
Chol/HDL Ratio: 3 {ratio} (ref 0.0–5.0)
Cholesterol, Total: 165 mg/dL (ref 100–199)
HDL: 55 mg/dL (ref >39)
LDL Chol Calc (NIH): 92 mg/dL (ref 0–99)
Triglycerides: 101 mg/dL (ref 0–149)
VLDL Cholesterol Cal: 18 mg/dL (ref 5–40)

## 2023-02-27 NOTE — Progress Notes (Signed)
 [facility]  Kyle Novak, is a 58 y.o. male  RDW:[id number]  FMW:[id number]  DOB - 1965-11-27  Chief Complaint  Patient presents with   Diabetes   Hypertension       Subjective:   Kyle Novak is a 58 y.o. male here today for a follow up visit HTN- Patient has No headache, No chest pain, No abdominal pain - No Nausea, No new weakness tingling or numbness, No Cough - shortness of breath T2D-Denies polyuria, polydipsia, polyphasia or vision changes.  Does not check blood sugars at home. Patient shared depression is real had a child hood friend that daughter committed suicide jumped off the bridge in [place] did not die from the jump but the 2nd car that ran over.  No problems updated.  Comprehensive ROS Pertinent positive and negative noted in HPI   No Known Allergies  Past Medical History:  Diagnosis Date   Diabetes mellitus without complication (HCC)    GERD (gastroesophageal reflux disease)    Hypertension     Current Outpatient Medications on File Prior to Visit  Medication Sig Dispense Refill   amLODipine  (NORVASC ) 10 MG tablet TAKE 1 TABLET (10 MG TOTAL) BY MOUTH DAILY. 90 tablet 1   omeprazole  (PRILOSEC) 20 MG capsule Take 1 capsule (20 mg total) by mouth daily. 90 capsule 0   pravastatin  (PRAVACHOL ) 10 MG tablet Take 1 tablet (10 mg total) by mouth daily. 30 tablet 0   Current Facility-Administered Medications on File Prior to Visit  Medication Dose Route Frequency Provider Last Rate Last Admin   acetaminophen  (TYLENOL ) tablet 650 mg  650 mg Oral Q4H PRN Bryan Agent, MD       Health Maintenance  Topic Date Due   DTaP/Tdap/Td vaccine (1 - Tdap) Never done   Zoster (Shingles) Vaccine (1 of 2) 12/31/2015   Pneumococcal Vaccination (2 of 2 - PCV) 03/31/2019   COVID-19 Vaccine (3 - 2024-25 season) 10/18/2022   Complete foot exam   12/03/2022   Medicare Annual Wellness Visit  04/04/2023   Flu Shot  05/17/2023*   Eye exam for diabetics   03/06/2023   Yearly kidney health urinalysis for diabetes  06/03/2023   Hemoglobin A1C  08/23/2023   Yearly kidney function blood test for diabetes  02/23/2024   Colon Cancer Screening  04/06/2028   Hepatitis C Screening  Completed   HIV Screening  Completed   HPV Vaccine  Aged Out  *Topic was postponed. The date shown is not the original due date.    Objective:   Vitals:   02/23/23 1631  BP: (!) 140/88  Pulse: (!) 107  Resp: 16  SpO2: 97%  Weight: 176 lb 9.6 oz (80.1 kg)  Height: 5' 9 (1.753 m)   BP Readings from Last 3 Encounters:  02/23/23 (!) 140/88  06/03/22 123/83  12/02/21 132/87   Physical exam: General: Vital signs reviewed.  Patient is well-developed and well-nourished, Body mass index is 26.08 kg/m. in no acute distress and cooperative with exam. Head: Normocephalic and atraumatic. Eyes: EOMI, conjunctivae normal, no scleral icterus. Neck: Supple, trachea midline, normal ROM, no JVD, masses, thyromegaly, or carotid bruit present. Cardiovascular: RRR, S1 normal, S2 normal, no murmurs, gallops, or rubs. Pulmonary/Chest: Clear to auscultation bilaterally, no wheezes, rales, or rhonchi. Abdominal: Soft, non-tender, non-distended, BS +, no masses, organomegaly, or guarding present. Musculoskeletal: No joint deformities, erythema, or stiffness, ROM full and nontender. Extremities: No lower extremity edema bilaterally,  pulses symmetric and intact bilaterally. No cyanosis or clubbing.  Neurological: A&O x3, Strength is normal Skin: Warm, dry and intact. No rashes or erythema. Psychiatric: Normal mood and affect. speech and behavior is normal. Cognition and memory are normal.     Assessment & Plan  [name] was seen today for diabetes and hypertension.  Diagnoses and all orders for this visit:  Diabetes mellitus without complication (HCC) - educated on lifestyle modifications, including but not limited to diet choices and adding exercise to daily routine.   -      POCT glycosylated hemoglobin (Hb A1C) -     CBC with Differential/Platelet -     CMP14+EGFR -     Lipid panel -     Dulaglutide  (TRULICITY ) 0.75 MG/0.5ML SOAJ; Inject 0.75 mg into the skin once a week.  Influenza vaccination declined  Insomnia, unspecified type -     DULoxetine  (CYMBALTA ) 60 MG capsule; TAKE 1 (ONE) CAPSULE (60 MG TOTAL) BY MOUTH DAILY.  Hypertension, unspecified type BP goal - < 130/80 Explained that having normal blood pressure is the goal and medications are helping to get to goal and maintain normal blood pressure. DIET: Limit salt intake, read nutrition labels to check salt content, limit fried and high fatty foods  Avoid using multisymptom OTC cold preparations that generally contain sudafed which can rise BP. Consult with pharmacist on best cold relief products to use for persons with HTN EXERCISE Discussed incorporating exercise such as walking - 30 minutes most days of the week and can do in 10 minute intervals    -     losartan  (COZAAR ) 25 MG tablet; Take 1 tablet (25 mg total) by mouth daily.  Chronic pain of right knee -     gabapentin  (NEURONTIN ) 600 MG tablet; Take 2 tablets (1,200 mg total) by mouth 3 (three) times daily.  Other order 2/2 Medication Refills -     metFORMIN  (GLUCOPHAGE -XR) 500 MG 24 hr tablet; Take 1 tablet (500 mg total) by mouth daily with breakfast. -     tadalafil  (CIALIS ) 20 MG tablet; Take 1 tablet (20 mg total) by mouth every other day as needed for erectile dysfunction.  3 months   The patient was given clear instructions to go to ER or return to medical center if symptoms don't improve, worsen or new problems develop. The patient verbalized understanding. The patient was told to call to get lab results if they haven't heard anything in the next week.   This note has been created with Education officer, environmental. Any transcriptional errors are unintentional.   [name], NP 02/27/2023,  8:37 PM

## 2023-03-10 ENCOUNTER — Telehealth (INDEPENDENT_AMBULATORY_CARE_PROVIDER_SITE_OTHER): Payer: Self-pay | Admitting: Primary Care

## 2023-03-10 NOTE — Telephone Encounter (Signed)
Will forward to provider  

## 2023-03-10 NOTE — Telephone Encounter (Signed)
Copied from CRM [id number]. Topic: General - Other >> Mar 10, 2023  1:11 PM Phill Myron wrote:  Ladene Artist with [facility] is calling to report that pt Tamer's Quantaflo on his left foot is reading 0.37.

## 2023-03-16 ENCOUNTER — Ambulatory Visit (INDEPENDENT_AMBULATORY_CARE_PROVIDER_SITE_OTHER): Payer: 59

## 2023-03-16 VITALS — BP 127/85

## 2023-03-16 DIAGNOSIS — Z0130 Encounter for examination of blood pressure without abnormal findings: Secondary | ICD-10-CM

## 2023-03-16 DIAGNOSIS — Z23 Encounter for immunization: Secondary | ICD-10-CM | POA: Diagnosis not present

## 2023-03-16 NOTE — Progress Notes (Signed)
   Blood Pressure Recheck Visit  Name: Kyle Novak MRN: [id number] Date of Birth: Apr 20, 1965  KHASIR WOODROME presents today for Blood Pressure recheck with clinical support staff.    BP Readings from Last 3 Encounters:  03/16/23 127/85  02/23/23 (!) 140/88  06/03/22 123/83    Current Outpatient Medications  Medication Sig Dispense Refill   amLODipine (NORVASC) 10 MG tablet TAKE 1 TABLET (10 MG TOTAL) BY MOUTH DAILY. 90 tablet 1   Dulaglutide (TRULICITY) 0.75 MG/0.5ML SOAJ Inject 0.75 mg into the skin once a week. 2 mL 3   DULoxetine (CYMBALTA) 60 MG capsule TAKE 1 (ONE) CAPSULE (60 MG TOTAL) BY MOUTH DAILY. 90 capsule 1   gabapentin (NEURONTIN) 600 MG tablet Take 2 tablets (1,200 mg total) by mouth 3 (three) times daily. 540 tablet 1   losartan (COZAAR) 25 MG tablet Take 1 tablet (25 mg total) by mouth daily. 90 tablet 1   metFORMIN (GLUCOPHAGE-XR) 500 MG 24 hr tablet Take 1 tablet (500 mg total) by mouth daily with breakfast. 90 tablet 1   omeprazole (PRILOSEC) 20 MG capsule Take 1 capsule (20 mg total) by mouth daily. 90 capsule 0   pravastatin (PRAVACHOL) 10 MG tablet Take 1 tablet (10 mg total) by mouth daily. 30 tablet 0   tadalafil (CIALIS) 20 MG tablet Take 1 tablet (20 mg total) by mouth every other day as needed for erectile dysfunction. 30 tablet 3   No current facility-administered medications for this visit.   Facility-Administered Medications Ordered in Other Visits  Medication Dose Route Frequency Provider Last Rate Last Admin   acetaminophen (TYLENOL) tablet 650 mg  650 mg Oral Q4H PRN Francene Boyers, MD        Hypertensive Medication Review: Patient states that they are taking all their hypertensive medications as prescribed and their last dose of hypertensive medications was this morning   Documentation of any medication adherence discrepancies: none  Provider Recommendation:  Spoke to [place] and she stated: bp is much better continue medication and  salt in take and will see in 3 months   Pt has also received Tdap vaccine while in office   Patient has been given provider's recommendations and does not have any questions or concerns at this time. Patient will contact the office for any future questions or concerns.

## 2023-03-16 NOTE — Patient Instructions (Signed)
Td (Tetanus, Diphtheria) Vaccine: What You Need to Know Many vaccine information statements are available in Spanish and other languages. See PromoAge.com.br. 1. Why get vaccinated? Td vaccine can prevent tetanus and diphtheria. Tetanus enters the body through cuts or wounds. Diphtheria spreads from person to person. TETANUS (T) causes painful stiffening of the muscles. Tetanus can lead to serious health problems, including being unable to open the mouth, having trouble swallowing and breathing, or death. DIPHTHERIA (D) can lead to difficulty breathing, heart failure, paralysis, or death. 2. Td vaccine Td is only for children 7 years and older, adolescents, and adults.  Td is usually given as a booster dose every 10 years, or after 5 years in the case of a severe or dirty wound or burn. Another vaccine, called "Tdap," may be used instead of Td. Tdap protects against pertussis, also known as "whooping cough," in addition to tetanus and diphtheria. Td may be given at the same time as other vaccines. 3. Talk with your health care provider Tell your vaccination provider if the person getting the vaccine: Has had an allergic reaction after a previous dose of any vaccine that protects against tetanus or diphtheria, or has any severe, life-threatening allergies Has ever had Guillain-Barr Syndrome (also called "GBS") Has had severe pain or swelling after a previous dose of any vaccine that protects against tetanus or diphtheria In some cases, your health care provider may decide to postpone Td vaccination until a future visit. People with minor illnesses, such as a cold, may be vaccinated. People who are moderately or severely ill should usually wait until they recover before getting Td vaccine.  Your health care provider can give you more information. 4. Risks of a vaccine reaction Pain, redness, or swelling where the shot was given, mild fever, headache, feeling tired, and nausea, vomiting,  diarrhea, or stomachache sometimes happen after Td vaccination. People sometimes faint after medical procedures, including vaccination. Tell your provider if you feel dizzy or have vision changes or ringing in the ears.  As with any medicine, there is a very remote chance of a vaccine causing a severe allergic reaction, other serious injury, or death. 5. What if there is a serious problem? An allergic reaction could occur after the vaccinated person leaves the clinic. If you see signs of a severe allergic reaction (hives, swelling of the face and throat, difficulty breathing, a fast heartbeat, dizziness, or weakness), call 9-1-1 and get the person to the nearest hospital.  For other signs that concern you, call your health care provider.  Adverse reactions should be reported to the Vaccine Adverse Event Reporting System (VAERS). Your health care provider will usually file this report, or you can do it yourself. Visit the VAERS website at www.vaers.LAgents.no or call (726)647-7322. VAERS is only for reporting reactions, and VAERS staff members do not give medical advice. 6. The National Vaccine Injury Compensation Program The [employer] Vaccine Injury Compensation Program (VICP) is a federal program that was created to compensate people who may have been injured by certain vaccines. Claims regarding alleged injury or death due to vaccination have a time limit for filing, which may be as short as two years. Visit the VICP website at SpiritualWord.at or call (272)397-5162 to learn about the program and about filing a claim. 7. How can I learn more? Ask your health care provider. Call your local or state health department. Visit the website of the Food and Drug Administration (FDA) for vaccine package inserts and additional information at FinderList.no. Contact  the Centers for Disease Control and Prevention (CDC): Call 4437483466 (1-800-CDC-INFO) or Visit  CDC's website at PicCapture.uy. Source: CDC Vaccine Information Statement Td (Tetanus, Diphtheria) Vaccine (09/22/2019) This same material is available at FootballExhibition.com.br for no charge. This information is not intended to replace advice given to you by your health care provider. Make sure you discuss any questions you have with your health care provider. Document Revised: 05/20/2022 Document Reviewed: 03/20/2022 Elsevier Patient Education  2024 [employer].

## 2023-03-19 ENCOUNTER — Other Ambulatory Visit (INDEPENDENT_AMBULATORY_CARE_PROVIDER_SITE_OTHER): Payer: Self-pay | Admitting: Primary Care

## 2023-03-23 ENCOUNTER — Other Ambulatory Visit (INDEPENDENT_AMBULATORY_CARE_PROVIDER_SITE_OTHER): Payer: Self-pay | Admitting: Primary Care

## 2023-03-23 DIAGNOSIS — E119 Type 2 diabetes mellitus without complications: Secondary | ICD-10-CM

## 2023-03-23 DIAGNOSIS — I739 Peripheral vascular disease, unspecified: Secondary | ICD-10-CM

## 2023-03-29 ENCOUNTER — Ambulatory Visit (INDEPENDENT_AMBULATORY_CARE_PROVIDER_SITE_OTHER): Payer: Self-pay | Admitting: Primary Care

## 2023-03-29 NOTE — Telephone Encounter (Signed)
 Chief Complaint: Flu-like symptoms Symptoms: cough, shaking, sweating, body aches, headache, shortness of breath Frequency: since Friday Pertinent Negatives: Patient denies n/a Disposition: [] ED /[x] Urgent Care (no appt availability in office) / [x] Appointment(In office/virtual)/ []  [place] [facility]/ [] Home Care/ [] Refused Recommended Disposition /[] [place] [facility]/ []  Follow-up with PCP Additional Notes: Patient called in stating he was experiencing symptoms similar to flu. Patient has shaking (chills), sweating, cough, headache, body aches. Patient states he is feeling slightly more short of breath than normal. Patient states some family members have been ill lately. Patient is a diabetic and states glucose levels are normal at this time. Patient is taking OTC medication and no improvement in symptoms. In-office appt made for tomorrow for further evaluation. Advised patient to call back asap if symptoms worsen.    Copied from CRM [id number]. Topic: Clinical - Red Word Triage >> Mar 29, 2023  1:34 PM Tisa Forester wrote: Red Word that prompted transfer to Nurse Triage: chest and throat burns really bad  having so difficulty breathing Reason for Disposition  Fever present > 3 days (72 hours)  Answer Assessment - Initial Assessment Questions 1. WORST SYMPTOM: "What is your worst symptom?" (e.g., cough, runny nose, muscle aches, headache, sore throat, fever)      Chills 2. ONSET: "When did your flu symptoms start?"      Friday night 3. COUGH: "How bad is the cough?"       Cough, not productive 4. RESPIRATORY DISTRESS: "Describe your breathing."      Feeling more short of breath 5. FEVER: "Do you have a fever?" If Yes, ask: "What is your temperature, how was it measured, and when did it start?"     Suspected with sweating and chills 6. EXPOSURE: "Were you exposed to someone with influenza?"       Family members have been really ill 7. FLU VACCINE: "Did you get a flu shot this year?"      No 8. HIGH RISK DISEASE: "Do you have any chronic medical problems?" (e.g., heart or lung disease, asthma, weak immune system, or other HIGH RISK conditions)     No, but patient is diabetic 10. OTHER SYMPTOMS: "Do you have any other symptoms?"  (e.g., runny nose, muscle aches, headache, sore throat)       Chills, sweating, coughing, shortness of breath, headache, body aches  Protocols used: Influenza (Flu) - [facility]

## 2023-03-30 ENCOUNTER — Ambulatory Visit (INDEPENDENT_AMBULATORY_CARE_PROVIDER_SITE_OTHER): Payer: 59 | Admitting: Primary Care

## 2023-03-30 ENCOUNTER — Encounter (INDEPENDENT_AMBULATORY_CARE_PROVIDER_SITE_OTHER): Payer: Self-pay | Admitting: Primary Care

## 2023-03-30 VITALS — BP 117/84 | HR 115 | Temp 99.2°F | Resp 16 | Ht 69.0 in | Wt 174.8 lb

## 2023-03-30 DIAGNOSIS — R6889 Other general symptoms and signs: Secondary | ICD-10-CM

## 2023-03-30 DIAGNOSIS — J069 Acute upper respiratory infection, unspecified: Secondary | ICD-10-CM | POA: Diagnosis not present

## 2023-03-30 LAB — POC SOFIA 2 FLU + SARS ANTIGEN FIA
Influenza A, POC: NEGATIVE
Influenza B, POC: NEGATIVE
SARS Coronavirus 2 Ag: NEGATIVE

## 2023-03-30 NOTE — Progress Notes (Addendum)
[facility]  Kyle Novak, is a 58 y.o. male  GNF:[id number]  QIO:[id number]  DOB - April 11, 1965  Chief Complaint  Patient presents with   URI    Symptoms started Saturday   shaking (chills), sweating, cough, headache, body aches. Patient states he is feeling slightly more short of breath than normal. Patient states some family members have been ill lately.  Patient is taking OTC medication(nyquil cough dm, alka seltzer cold and flu and tylenol) with no improvement in symptoms.       Subjective:   Kyle Novak is a 58 y.o. male here today for an acute visit. Respiratory panel negative.  He has been around his grandchildren and nephews.  He admits that they have had the same symptoms. Coughing productive with streaks of blood and brown.  Explain probable viral infection.  Keep hands washed, use paper plates, plastic silverware and cups.  If using the dishwasher and Clorox.  This can help with the spread of infection.  His main concern is feeling fatigued. He has shortness of breath, headaches, chest pain especially when coughing. Denies lower extremity edema, sudden onset, vision changes, unilateral weakness, dizziness, paresthesias    No problems updated.  Comprehensive ROS Pertinent positive and negative noted in HPI   No Known Allergies  Past Medical History:  Diagnosis Date   Diabetes mellitus without complication (HCC)    GERD (gastroesophageal reflux disease)    Hypertension     Current Outpatient Medications on File Prior to Visit  Medication Sig Dispense Refill   amLODipine (NORVASC) 10 MG tablet TAKE 1 TABLET (10 MG TOTAL) BY MOUTH DAILY. 90 tablet 1   Dulaglutide (TRULICITY) 0.75 MG/0.5ML SOAJ Inject 0.75 mg into the skin once a week. 2 mL 3   DULoxetine (CYMBALTA) 60 MG capsule TAKE 1 (ONE) CAPSULE (60 MG TOTAL) BY MOUTH DAILY. 90 capsule 1   gabapentin (NEURONTIN) 600 MG tablet Take 2 tablets (1,200 mg total) by mouth 3 (three) times daily.  540 tablet 1   losartan (COZAAR) 25 MG tablet Take 1 tablet (25 mg total) by mouth daily. 90 tablet 1   metFORMIN (GLUCOPHAGE-XR) 500 MG 24 hr tablet Take 1 tablet (500 mg total) by mouth daily with breakfast. 90 tablet 1   omeprazole (PRILOSEC) 20 MG capsule Take 1 capsule (20 mg total) by mouth daily. 90 capsule 0   pravastatin (PRAVACHOL) 10 MG tablet Take 1 tablet (10 mg total) by mouth daily. 30 tablet 0   tadalafil (CIALIS) 20 MG tablet Take 1 tablet (20 mg total) by mouth every other day as needed for erectile dysfunction. 30 tablet 3   Current Facility-Administered Medications on File Prior to Visit  Medication Dose Route Frequency Provider Last Rate Last Admin   acetaminophen (TYLENOL) tablet 650 mg  650 mg Oral Q4H PRN Francene Boyers, MD       Health Maintenance  Topic Date Due   Zoster (Shingles) Vaccine (1 of 2) 12/31/2015   Pneumococcal Vaccination (2 of 2 - PCV) 03/31/2019   COVID-19 Vaccine (3 - 2024-25 season) 10/18/2022   Complete foot exam   12/03/2022   Eye exam for diabetics  03/06/2023   Medicare Annual Wellness Visit  04/04/2023   Flu Shot  05/17/2023*   Yearly kidney health urinalysis for diabetes  06/03/2023   Hemoglobin A1C  08/23/2023   Yearly kidney function blood test for diabetes  02/23/2024   Colon Cancer Screening  04/06/2028   DTaP/Tdap/Td vaccine (2 - Td or Tdap) 03/15/2033  Hepatitis C Screening  Completed   HIV Screening  Completed   HPV Vaccine  Aged Out  *Topic was postponed. The date shown is not the original due date.    Objective:   Vitals:   03/30/23 1416  BP: 117/84  Pulse: (!) 115  Resp: 16  Temp: 99.2 F (37.3 C)  SpO2: 95%  Weight: 174 lb 12.8 oz (79.3 kg)  Height: 5\' 9"  (1.753 m)   BP Readings from Last 3 Encounters:  03/30/23 117/84  03/16/23 127/85  02/23/23 (!) 140/88      Physical Exam Vitals reviewed.  Constitutional:      Appearance: He is ill-appearing.  HENT:     Head: Normocephalic.     Comments: Pharynx  and  tonsils  red slightly swollen     Right Ear: Tympanic membrane, ear canal and external ear normal.     Left Ear: Tympanic membrane, ear canal and external ear normal.     Ears:     Comments: Canal red left > right    Nose: Congestion present.     Comments: Nares turbinate red not swollen using antihistamine nasal spray Eyes:     Extraocular Movements: Extraocular movements intact.  Cardiovascular:     Rate and Rhythm: Normal rate and regular rhythm.  Pulmonary:     Effort: Pulmonary effort is normal.     Breath sounds: Normal breath sounds.  Abdominal:     General: Bowel sounds are normal. There is no distension.     Palpations: Abdomen is soft.  Musculoskeletal:        General: Normal range of motion.     Cervical back: Normal range of motion.  Skin:    General: Skin is warm and dry.  Neurological:     Mental Status: He is oriented to person, place, and time.  Psychiatric:        Mood and Affect: Mood normal.        Behavior: Behavior normal.      Assessment & Plan  Rushi was seen today for uri.  Diagnoses and all orders for this visit:  Flu-like symptoms -     POC SOFIA 2 FLU + SARS ANTIGEN FIA See HPI    Patient have been counseled extensively about nutrition and exercise. Other issues discussed during this visit include: low cholesterol diet, weight control and daily exercise, foot care, annual eye examinations at Ophthalmology, importance of adherence with medications and regular follow-up. We also discussed long term complications of uncontrolled diabetes and hypertension.   Return in about 3 months (around 06/27/2023), or DM /BP.  The patient was given clear instructions to go to ER or return to medical center if symptoms don't improve, worsen or new problems develop. The patient verbalized understanding. The patient was told to call to get lab results if they haven't heard anything in the next week.   This note has been created with Engineer, agricultural. Any transcriptional errors are unintentional.   Grayce Sessions, NP 03/30/2023, 2:57 PM

## 2023-03-31 ENCOUNTER — Ambulatory Visit (INDEPENDENT_AMBULATORY_CARE_PROVIDER_SITE_OTHER): Payer: 59 | Admitting: Primary Care

## 2023-03-31 ENCOUNTER — Ambulatory Visit (INDEPENDENT_AMBULATORY_CARE_PROVIDER_SITE_OTHER): Payer: Self-pay | Admitting: Primary Care

## 2023-03-31 ENCOUNTER — Ambulatory Visit (HOSPITAL_COMMUNITY)
Admission: RE | Admit: 2023-03-31 | Discharge: 2023-03-31 | Disposition: A | Discharge status: Home / Self Care | Payer: 59 | Source: Ambulatory Visit | Attending: Primary Care | Admitting: Primary Care

## 2023-03-31 DIAGNOSIS — J069 Acute upper respiratory infection, unspecified: Secondary | ICD-10-CM | POA: Insufficient documentation

## 2023-03-31 NOTE — Telephone Encounter (Signed)
noted

## 2023-03-31 NOTE — Telephone Encounter (Signed)
  Chief Complaint: cough Symptoms: cough, runny nose, chest burns when coughing, low grade fever Frequency: started 03/27/2023 Disposition: [] ED /[] Urgent Care (no appt availability in office) / [x] Appointment(In office/virtual)/ []  [place] [facility]/ [] Home Care/ [] Refused Recommended Disposition /[] [place] [facility]/ []  Follow-up with PCP Additional Notes: patient saw provider yesterday for possible flu like symptoms. Patient endorses worsened cough and possible sinus infection. Patient endorses runny nose, cough, burning in his chest with coughing and low grade fever. Patient is thinking he has a sinus infection. Per protocol, an appointment made for 03/31/2023 at 3:10 pm is available. Patient agrees to appointment and verbalizes understanding of the plan. All questions answered.     Copied From CRM [id number].  Reason for Triage: recently saw provider, but was told to take OTC meds but now his mucus is starting to have an odor. Please advise  Reason for Disposition  [1] MILD difficulty breathing (e.g., minimal/no SOB at rest, SOB with walking, pulse <100) AND [2] still present when not coughing  Answer Assessment - Initial Assessment Questions 1. ONSET: "When did the cough begin?"      Saturday 03/27/2023 2. SEVERITY: "How bad is the cough today?"      10 3. SPUTUM: "Describe the color of your sputum" (none, dry cough; clear, white, yellow, green)     Yellow to brown 4. HEMOPTYSIS: "Are you coughing up any blood?" If so ask: "How much?" (flecks, streaks, tablespoons, etc.)     Flecks  5. DIFFICULTY BREATHING: "Are you having difficulty breathing?" If Yes, ask: "How bad is it?" (e.g., mild, moderate, severe)    - MILD: No SOB at rest, mild SOB with walking, speaks normally in sentences, can lie down, no retractions, pulse < 100.    - MODERATE: SOB at rest, SOB with minimal exertion and prefers to sit, cannot lie down flat, speaks in phrases, mild retractions, audible wheezing, pulse  100-120.    - SEVERE: Very SOB at rest, speaks in single words, struggling to breathe, sitting hunched forward, retractions, pulse > 120      Mild-patient states he is breathing harder than he normally does 6. FEVER: "Do you have a fever?" If Yes, ask: "What is your temperature, how was it measured, and when did it start?"     Yes-low grade fevers 7. CARDIAC HISTORY: "Do you have any history of heart disease?" (e.g., heart attack, congestive heart failure)      no 8. LUNG HISTORY: "Do you have any history of lung disease?"  (e.g., pulmonary embolus, asthma, emphysema)     no 9. PE RISK FACTORS: "Do you have a history of blood clots?" (or: recent major surgery, recent prolonged travel, bedridden)     no 10. OTHER SYMPTOMS: "Do you have any other symptoms?" (e.g., runny nose, wheezing, chest pain)       Runny nose, chest hurts when coughing  Protocols used: Cough - Acute Productive-A-AH

## 2023-03-31 NOTE — Addendum Note (Signed)
Addended by: Herbert Deaner on: 03/31/2023 03:39 PM   Modules accepted: Orders

## 2023-04-01 ENCOUNTER — Telehealth (INDEPENDENT_AMBULATORY_CARE_PROVIDER_SITE_OTHER): Payer: Self-pay | Admitting: Primary Care

## 2023-04-01 NOTE — Telephone Encounter (Signed)
PEC called Kyle Novak) and stated that Pt called stating he is still not feeling better. No change in how he is feeling since being seen.   Pt is wanting to see if she can result his X-Ray and see if he could know what is going on and if medication can be prescribed.

## 2023-04-01 NOTE — Telephone Encounter (Signed)
Copied from CRM [id number]. Topic: Clinical - Lab/Test Results >> Apr 01, 2023 10:00 AM Patsy Lager T wrote: Reason for CRM: patient stated there is no change in his symptoms and request someone call him about his results

## 2023-04-02 NOTE — Telephone Encounter (Signed)
Will forward to provider

## 2023-04-03 ENCOUNTER — Encounter (INDEPENDENT_AMBULATORY_CARE_PROVIDER_SITE_OTHER): Payer: Self-pay | Admitting: Primary Care

## 2023-04-07 ENCOUNTER — Other Ambulatory Visit (INDEPENDENT_AMBULATORY_CARE_PROVIDER_SITE_OTHER): Payer: Self-pay | Admitting: Primary Care

## 2023-05-03 ENCOUNTER — Other Ambulatory Visit (INDEPENDENT_AMBULATORY_CARE_PROVIDER_SITE_OTHER): Payer: Self-pay | Admitting: Primary Care

## 2023-05-03 DIAGNOSIS — G4700 Insomnia, unspecified: Secondary | ICD-10-CM

## 2023-05-03 NOTE — Telephone Encounter (Signed)
 Will forward to provider

## 2023-05-10 ENCOUNTER — Other Ambulatory Visit: Payer: Self-pay

## 2023-05-10 DIAGNOSIS — I739 Peripheral vascular disease, unspecified: Secondary | ICD-10-CM

## 2023-05-19 NOTE — Progress Notes (Unsigned)
 Patient ID: Kyle Novak, male   DOB: 13-Jul-1965, 58 y.o.   MRN: [id number]  Reason for Consult: No chief complaint on file.   Referred by Grayce Sessions, NP  Subjective:     HPI  Kyle Novak is a 58 y.o. male ***  Past Medical History:  Diagnosis Date   Diabetes mellitus without complication (HCC)    GERD (gastroesophageal reflux disease)    Hypertension    Family History  Problem Relation Age of Onset   Lung cancer Father    Heart failure Brother    Past Surgical History:  Procedure Laterality Date   KNEE ARTHROSCOPY WITH MEDIAL MENISECTOMY Right 11/23/2018   Procedure: RIGHT KNEE ARTHROSCOPY WITH PARTIAL MEDIAL MENISCECTOMY, SYNOVECTOMY;  Surgeon: Tarry Kos, MD;  Location: [facility];  Service: Orthopedics;  Laterality: Right;   NO PAST SURGERIES     WISDOM TOOTH EXTRACTION      Short Social History:  Social History   Tobacco Use   Smoking status: Every Day    Current packs/day: 0.50    Types: Cigarettes   Smokeless tobacco: Never  Substance Use Topics   Alcohol use: Yes    Comment: OCCASIONAL    No Known Allergies  Current Outpatient Medications  Medication Sig Dispense Refill   amLODipine (NORVASC) 10 MG tablet TAKE 1 TABLET (10 MG TOTAL) BY MOUTH DAILY. 90 tablet 1   Dulaglutide (TRULICITY) 0.75 MG/0.5ML SOAJ Inject 0.75 mg into the skin once a week. 2 mL 3   DULoxetine (CYMBALTA) 60 MG capsule TAKE 1 (ONE) CAPSULE (60 MG TOTAL) BY MOUTH DAILY. 90 capsule 1   gabapentin (NEURONTIN) 600 MG tablet Take 2 tablets (1,200 mg total) by mouth 3 (three) times daily. 540 tablet 1   losartan (COZAAR) 25 MG tablet Take 1 tablet (25 mg total) by mouth daily. 90 tablet 1   metFORMIN (GLUCOPHAGE-XR) 500 MG 24 hr tablet Take 1 tablet (500 mg total) by mouth daily with breakfast. 90 tablet 1   omeprazole (PRILOSEC) 20 MG capsule Take 1 capsule (20 mg total) by mouth daily. 90 capsule 0   pravastatin (PRAVACHOL) 10 MG tablet TAKE 1  TABLET (10 MG TOTAL) BY MOUTH DAILY. 30 tablet 0   tadalafil (CIALIS) 20 MG tablet Take 1 tablet (20 mg total) by mouth every other day as needed for erectile dysfunction. 30 tablet 3   No current facility-administered medications for this visit.   Facility-Administered Medications Ordered in Other Visits  Medication Dose Route Frequency Provider Last Rate Last Admin   acetaminophen (TYLENOL) tablet 650 mg  650 mg Oral Q4H PRN Francene Boyers, MD        REVIEW OF SYSTEMS  Positive for ***  All other systems were reviewed and are negative     Objective:  Objective   There were no vitals filed for this visit. There is no height or weight on file to calculate BMI.  Physical Exam General: no acute distress Cardiac: hemodynamically stable Pulm: normal work of breathing Abdomen: non-tender, no pulsatile mass*** Neuro: alert, no focal deficit Extremities: no edema, cyanosis or wounds*** Vascular:   Right: ***  Left: ***   Data: ABI ***     Assessment/Plan:     Kyle Novak is a 58 y.o. male ***    Recommendations to optimize cardiovascular risk: Abstinence from all tobacco products. Blood glucose control with goal A1c < 7%. Blood pressure control with goal blood pressure < 140/90 mmHg. Lipid reduction  therapy with goal LDL-C <100 mg/dL  Aspirin 81mg  PO QD.  Atorvastatin 40-80mg  PO QD (or other "high intensity" statin therapy).     Daria Pastures MD [facility]

## 2023-05-21 ENCOUNTER — Ambulatory Visit (HOSPITAL_COMMUNITY)
Admission: RE | Admit: 2023-05-21 | Discharge: 2023-05-21 | Disposition: A | Discharge status: Home / Self Care | Payer: 59 | Source: Ambulatory Visit | Attending: Vascular Surgery | Admitting: Vascular Surgery

## 2023-05-21 ENCOUNTER — Encounter: Payer: Self-pay | Admitting: Vascular Surgery

## 2023-05-21 ENCOUNTER — Ambulatory Visit: Payer: 59 | Admitting: Vascular Surgery

## 2023-05-21 VITALS — BP 129/83 | HR 94 | Temp 98.2°F | Resp 18 | Ht 69.0 in | Wt 174.7 lb

## 2023-05-21 DIAGNOSIS — I739 Peripheral vascular disease, unspecified: Secondary | ICD-10-CM | POA: Insufficient documentation

## 2023-05-21 DIAGNOSIS — G629 Polyneuropathy, unspecified: Secondary | ICD-10-CM | POA: Diagnosis not present

## 2023-05-21 LAB — VAS US ABI WITH/WO TBI
Left ABI: 0.89
Right ABI: 1.1

## 2023-05-27 ENCOUNTER — Other Ambulatory Visit (INDEPENDENT_AMBULATORY_CARE_PROVIDER_SITE_OTHER): Payer: Self-pay

## 2023-05-27 DIAGNOSIS — E119 Type 2 diabetes mellitus without complications: Secondary | ICD-10-CM

## 2023-05-27 MED ORDER — TRULICITY 0.75 MG/0.5ML ~~LOC~~ SOAJ: 0.7500 mg | SUBCUTANEOUS | 3 refills | Status: DC

## 2023-07-07 ENCOUNTER — Encounter: Payer: Self-pay | Admitting: Pharmacist

## 2023-07-07 ENCOUNTER — Other Ambulatory Visit (HOSPITAL_BASED_OUTPATIENT_CLINIC_OR_DEPARTMENT_OTHER): Admitting: Pharmacist

## 2023-07-07 DIAGNOSIS — E785 Hyperlipidemia, unspecified: Secondary | ICD-10-CM

## 2023-07-07 MED ORDER — PRAVASTATIN SODIUM 10 MG PO TABS: 10.0000 mg | ORAL_TABLET | Freq: Every day | ORAL | 1 refills | Status: DC

## 2023-07-07 NOTE — Progress Notes (Signed)
 Pharmacy Quality Measure Review  This patient is appearing on a report for being at risk of failing the adherence measure for cholesterol (statin) medications this calendar year.   Medication: pravastatin  Last fill date: 04/08/23 for 30 day supply  Contacted pharmacy to facilitate refills.  Marene Shape, PharmD, Becky Bowels, CPP Clinical Pharmacist [facility] & [facility] [phone]

## 2023-07-14 ENCOUNTER — Other Ambulatory Visit (HOSPITAL_BASED_OUTPATIENT_CLINIC_OR_DEPARTMENT_OTHER): Admitting: Pharmacist

## 2023-07-14 DIAGNOSIS — E785 Hyperlipidemia, unspecified: Secondary | ICD-10-CM

## 2023-07-14 NOTE — Progress Notes (Signed)
 Pharmacy Quality Measure Review  This patient is appearing on a report for being at risk of failing the adherence measure for cholesterol (statin) medications this calendar year.   Medication: pravastatin  Last fill date: 04/08/23 for 30 day supply  Contacted pharmacy to facilitate refills. I contacted them last week and pt has still not filled the medication.   Marene Shape, PharmD, Becky Bowels, CPP Clinical Pharmacist [facility] & [facility] [phone]

## 2023-07-19 ENCOUNTER — Other Ambulatory Visit (INDEPENDENT_AMBULATORY_CARE_PROVIDER_SITE_OTHER): Payer: Self-pay | Admitting: Primary Care

## 2023-08-05 ENCOUNTER — Other Ambulatory Visit (INDEPENDENT_AMBULATORY_CARE_PROVIDER_SITE_OTHER): Payer: Self-pay | Admitting: Primary Care

## 2023-08-05 DIAGNOSIS — I10 Essential (primary) hypertension: Secondary | ICD-10-CM

## 2023-08-06 ENCOUNTER — Other Ambulatory Visit (INDEPENDENT_AMBULATORY_CARE_PROVIDER_SITE_OTHER): Payer: Self-pay

## 2023-08-22 ENCOUNTER — Emergency Department (HOSPITAL_BASED_OUTPATIENT_CLINIC_OR_DEPARTMENT_OTHER)
Admission: EM | Admit: 2023-08-22 | Discharge: 2023-08-22 | Disposition: A | Discharge status: Home / Self Care | Attending: Emergency Medicine | Admitting: Emergency Medicine

## 2023-08-22 ENCOUNTER — Other Ambulatory Visit: Payer: Self-pay

## 2023-08-22 ENCOUNTER — Encounter (HOSPITAL_BASED_OUTPATIENT_CLINIC_OR_DEPARTMENT_OTHER): Payer: Self-pay | Admitting: Emergency Medicine

## 2023-08-22 ENCOUNTER — Emergency Department (HOSPITAL_BASED_OUTPATIENT_CLINIC_OR_DEPARTMENT_OTHER)

## 2023-08-22 DIAGNOSIS — E119 Type 2 diabetes mellitus without complications: Secondary | ICD-10-CM | POA: Insufficient documentation

## 2023-08-22 DIAGNOSIS — M25552 Pain in left hip: Secondary | ICD-10-CM | POA: Diagnosis present

## 2023-08-22 DIAGNOSIS — F17210 Nicotine dependence, cigarettes, uncomplicated: Secondary | ICD-10-CM | POA: Diagnosis not present

## 2023-08-22 DIAGNOSIS — Z7984 Long term (current) use of oral hypoglycemic drugs: Secondary | ICD-10-CM | POA: Diagnosis not present

## 2023-08-22 DIAGNOSIS — R519 Headache, unspecified: Secondary | ICD-10-CM | POA: Insufficient documentation

## 2023-08-22 DIAGNOSIS — R109 Unspecified abdominal pain: Secondary | ICD-10-CM | POA: Diagnosis not present

## 2023-08-22 DIAGNOSIS — Y92410 Unspecified street and highway as the place of occurrence of the external cause: Secondary | ICD-10-CM | POA: Diagnosis not present

## 2023-08-22 DIAGNOSIS — M542 Cervicalgia: Secondary | ICD-10-CM | POA: Insufficient documentation

## 2023-08-22 DIAGNOSIS — R0781 Pleurodynia: Secondary | ICD-10-CM | POA: Insufficient documentation

## 2023-08-22 LAB — CBC WITH DIFFERENTIAL/PLATELET
Abs Immature Granulocytes: 0.06 K/uL (ref 0.00–0.07)
Basophils Absolute: 0 K/uL (ref 0.0–0.1)
Basophils Relative: 0 %
Eosinophils Absolute: 0.1 K/uL (ref 0.0–0.5)
Eosinophils Relative: 1 %
HCT: 43.7 % (ref 39.0–52.0)
Hemoglobin: 14.5 g/dL (ref 13.0–17.0)
Immature Granulocytes: 1 %
Lymphocytes Relative: 19 %
Lymphs Abs: 2.4 K/uL (ref 0.7–4.0)
MCH: 29.3 pg (ref 26.0–34.0)
MCHC: 33.2 g/dL (ref 30.0–36.0)
MCV: 88.3 fL (ref 80.0–100.0)
Monocytes Absolute: 1.2 K/uL — ABNORMAL HIGH (ref 0.1–1.0)
Monocytes Relative: 9 %
Neutro Abs: 8.8 K/uL — ABNORMAL HIGH (ref 1.7–7.7)
Neutrophils Relative %: 70 %
Platelets: 373 K/uL (ref 150–400)
RBC: 4.95 MIL/uL (ref 4.22–5.81)
RDW: 12.7 % (ref 11.5–15.5)
WBC: 12.5 K/uL — ABNORMAL HIGH (ref 4.0–10.5)
nRBC: 0 % (ref 0.0–0.2)

## 2023-08-22 LAB — BASIC METABOLIC PANEL WITH GFR
Anion gap: 11 (ref 5–15)
BUN: 15 mg/dL (ref 6–20)
CO2: 25 mmol/L (ref 22–32)
Calcium: 9.9 mg/dL (ref 8.9–10.3)
Chloride: 101 mmol/L (ref 98–111)
Creatinine, Ser: 0.95 mg/dL (ref 0.61–1.24)
GFR, Estimated: >60 mL/min (ref >60)
Glucose, Bld: 172 mg/dL — ABNORMAL HIGH (ref 70–99)
Potassium: 4.8 mmol/L (ref 3.5–5.1)
Sodium: 137 mmol/L (ref 135–145)

## 2023-08-22 MED ORDER — IOHEXOL 300 MG/ML  SOLN
100.0000 mL | Freq: Once | INTRAMUSCULAR | Status: AC | PRN
  Administered 2023-08-22: 100 mL via INTRAVENOUS
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
## 2023-08-25 ENCOUNTER — Encounter (INDEPENDENT_AMBULATORY_CARE_PROVIDER_SITE_OTHER): Payer: Self-pay | Admitting: Primary Care

## 2023-08-25 ENCOUNTER — Ambulatory Visit (INDEPENDENT_AMBULATORY_CARE_PROVIDER_SITE_OTHER): Admitting: Primary Care

## 2023-08-25 VITALS — BP 140/92 | HR 109 | Resp 16 | Wt 172.0 lb

## 2023-08-25 DIAGNOSIS — E119 Type 2 diabetes mellitus without complications: Secondary | ICD-10-CM

## 2023-08-25 DIAGNOSIS — R7303 Prediabetes: Secondary | ICD-10-CM

## 2023-08-25 DIAGNOSIS — N400 Enlarged prostate without lower urinary tract symptoms: Secondary | ICD-10-CM

## 2023-08-25 DIAGNOSIS — I10 Essential (primary) hypertension: Secondary | ICD-10-CM | POA: Diagnosis not present

## 2023-08-25 DIAGNOSIS — Z09 Encounter for follow-up examination after completed treatment for conditions other than malignant neoplasm: Secondary | ICD-10-CM | POA: Diagnosis not present

## 2023-08-25 DIAGNOSIS — Z7985 Long-term (current) use of injectable non-insulin antidiabetic drugs: Secondary | ICD-10-CM

## 2023-08-25 LAB — POCT GLYCOSYLATED HEMOGLOBIN (HGB A1C): HbA1c, POC (prediabetic range): 6.8 % — AB (ref 5.7–6.4)

## 2023-08-25 MED ORDER — LOSARTAN POTASSIUM 50 MG PO TABS: 50.0000 mg | ORAL_TABLET | Freq: Every day | ORAL | 1 refills | Status: DC
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
## 2023-08-30 NOTE — Patient Instructions (Signed)
 w

## 2023-09-03 ENCOUNTER — Encounter (INDEPENDENT_AMBULATORY_CARE_PROVIDER_SITE_OTHER): Payer: Self-pay

## 2023-09-03 ENCOUNTER — Encounter (INDEPENDENT_AMBULATORY_CARE_PROVIDER_SITE_OTHER)

## 2023-09-03 ENCOUNTER — Ambulatory Visit (INDEPENDENT_AMBULATORY_CARE_PROVIDER_SITE_OTHER)

## 2023-09-03 DIAGNOSIS — Z0000 Encounter for general adult medical examination without abnormal findings: Secondary | ICD-10-CM

## 2023-09-03 NOTE — Progress Notes (Signed)
 Subjective:   Kyle Novak is a 58 y.o. male who presents for Medicare Annual/Subsequent preventive examination.  Visit Complete: Virtual I connected with  Kyle Novak on 09/03/23 by a audio enabled telemedicine application and verified that I am speaking with the correct person using two identifiers.  Patient Location: Home  Provider Location: Office/Clinic  I discussed the limitations of evaluation and management by telemedicine. The patient expressed understanding and agreed to proceed.  Vital Signs: Because this visit was a virtual/telehealth visit, some criteria may be missing or patient reported. Any vitals not documented were not able to be obtained and vitals that have been documented are patient reported.  Patient Medicare AWV questionnaire was completed on 09/03/23 with the nurse; I have confirmed that all information answered by patient is correct and no changes since this date.  Cardiac Risk Factors include: advanced age (>56men, >63 women);diabetes mellitus;dyslipidemia;hypertension     Objective:    Today's Vitals   09/03/23 1159  PainSc: 5    There is no height or weight on file to calculate BMI.     09/03/2023   12:01 PM 08/22/2023    5:25 PM 04/03/2022   12:37 PM 11/17/2020    9:28 AM 02/14/2019    5:24 PM 02/07/2019   12:35 AM 01/11/2019    3:48 PM  Advanced Directives  Does Patient Have a Medical Advance Directive? No No No No No No No  Would patient like information on creating a medical advance directive? No - Guardian declined No - Patient declined No - Patient declined No - Patient declined Yes (ED - Information included in AVS) No - Patient declined No - Patient declined    Current Medications (verified) Outpatient Encounter Medications as of 09/03/2023  Medication Sig   amLODipine  (NORVASC ) 10 MG tablet TAKE 1 TABLET (10 MG TOTAL) BY MOUTH DAILY.   Dulaglutide  (TRULICITY ) 0.75 MG/0.5ML SOAJ Inject 0.75 mg into the skin once a week.    DULoxetine  (CYMBALTA ) 60 MG capsule TAKE 1 (ONE) CAPSULE (60 MG TOTAL) BY MOUTH DAILY.   gabapentin  (NEURONTIN ) 600 MG tablet Take 2 tablets (1,200 mg total) by mouth 3 (three) times daily.   losartan  (COZAAR ) 50 MG tablet Take 1 tablet (50 mg total) by mouth daily.   metFORMIN  (GLUCOPHAGE -XR) 500 MG 24 hr tablet Take 1 tablet (500 mg total) by mouth daily with breakfast.   omeprazole  (PRILOSEC) 20 MG capsule TAKE 1 CAPSULE (20 MG TOTAL) BY MOUTH DAILY.   pravastatin  (PRAVACHOL ) 10 MG tablet Take 1 tablet (10 mg total) by mouth daily.   tadalafil  (CIALIS ) 20 MG tablet Take 1 tablet (20 mg total) by mouth every other day as needed for erectile dysfunction.   Facility-Administered Encounter Medications as of 09/03/2023  Medication   acetaminophen  (TYLENOL ) tablet 650 mg    Allergies (verified) Patient has no known allergies.   History: Past Medical History:  Diagnosis Date   Diabetes mellitus without complication (HCC)    GERD (gastroesophageal reflux disease)    Hyperlipidemia    Hypertension    Past Surgical History:  Procedure Laterality Date   KNEE ARTHROSCOPY WITH MEDIAL MENISECTOMY Right 11/23/2018   Procedure: RIGHT KNEE ARTHROSCOPY WITH PARTIAL MEDIAL MENISCECTOMY, SYNOVECTOMY;  Surgeon: Jerri Kay HERO, MD;  Location: [facility];  Service: Orthopedics;  Laterality: Right;   NO PAST SURGERIES     WISDOM TOOTH EXTRACTION     Family History  Problem Relation Age of Onset   Lung cancer Father  Heart failure Brother    Social History   Socioeconomic History   Marital status: Single    Spouse name: Not on file   Number of children: Not on file   Years of education: Not on file   Highest education level: Not on file  Occupational History   Not on file  Tobacco Use   Smoking status: Every Day    Current packs/day: 0.50    Types: Cigarettes   Smokeless tobacco: Never  Vaping Use   Vaping status: Never Used  Substance and Sexual Activity   Alcohol use:  Yes    Comment: OCCASIONAL   Drug use: No   Sexual activity: Not on file  Other Topics Concern   Not on file  Social History Narrative   Not on file   Social Drivers of Health   Financial Resource Strain: Low Risk  (11/17/2020)   Overall Financial Resource Strain (CARDIA)    Difficulty of Paying Living Expenses: Not hard at all  Food Insecurity: No Food Insecurity (03/30/2023)   Hunger Vital Sign    Worried About Running Out of Food in the Last Year: Never true    Ran Out of Food in the Last Year: Never true  Transportation Needs: No Transportation Needs (03/30/2023)   PRAPARE - Administrator, Civil Service (Medical): No    Lack of Transportation (Non-Medical): No  Physical Activity: Unknown (11/17/2020)   Exercise Vital Sign    Days of Exercise per Week: Not on file    Minutes of Exercise per Session: 30 min  Stress: No Stress Concern Present (11/17/2020)   [employer] of Occupational Health - Occupational Stress Questionnaire    Feeling of Stress : Not at all  Social Connections: Unknown (11/17/2020)   Social Connection and Isolation Panel    Frequency of Communication with Friends and Family: More than three times a week    Frequency of Social Gatherings with Friends and Family: More than three times a week    Attends Religious Services: Never    Database administrator or Organizations: No    Attends Engineer, structural: Never    Marital Status: Not on file    Tobacco Counseling Ready to quit: Not Answered Counseling given: Not Answered   Clinical Intake:     Pain : 0-10 Pain Score: 5  Pain Location: Flank     Diabetes: Yes CBG done?: No Did pt. bring in CBG monitor from home?: No  How often do you need to have someone help you when you read instructions, pamphlets, or other written materials from your doctor or pharmacy?: 1 - Never         Activities of Daily Living    09/03/2023   12:00 PM  In your present state of health,  do you have any difficulty performing the following activities:  Hearing? 0  Vision? 0  Difficulty concentrating or making decisions? 0  Walking or climbing stairs? 0  Dressing or bathing? 0  Doing errands, shopping? 0  Preparing Food and eating ? N  Using the Toilet? N  In the past six months, have you accidently leaked urine? N  Do you have problems with loss of bowel control? N  Managing your Medications? N  Managing your Finances? N  Housekeeping or managing your Housekeeping? N    Patient Care Team: Celestia Rosaline SQUIBB, NP as PCP - General (Internal Medicine)  Indicate any recent Medical Services you may have received from  other than [facility] providers in the past year (date may be approximate).     Assessment:   This is a routine wellness examination for Kyle Novak.  Hearing/Vision screen No results found.   Goals Addressed   None    Depression Screen    09/03/2023   12:02 PM 08/25/2023    1:46 PM 03/30/2023    2:18 PM 06/03/2022    3:11 PM 04/03/2022   12:36 PM 09/01/2021    2:10 PM 10/14/2020   11:13 AM  PHQ 2/9 Scores  PHQ - 2 Score 0 0 0 0 0 0 0    Fall Risk    09/03/2023   12:02 PM 06/03/2022    3:11 PM 04/03/2022   12:36 PM 12/02/2021    3:14 PM 09/01/2021    2:10 PM  Fall Risk   Falls in the past year? 0 0 0 0 0  Number falls in past yr: 0 0 0 0   Injury with Fall? 0 0 0 0   Risk for fall due to : No Fall Risks No Fall Risks No Fall Risks No Fall Risks   Follow up Education provided        MEDICARE RISK AT HOME: Medicare Risk at Home Any stairs in or around the home?: Yes If so, are there any without handrails?: No Home free of loose throw rugs in walkways, pet beds, electrical cords, etc?: Yes Adequate lighting in your home to reduce risk of falls?: Yes Life alert?: No Use of a cane, walker or w/c?: No Grab bars in the bathroom?: No Shower chair or bench in shower?: No Elevated toilet seat or a handicapped toilet?: No  TIMED UP AND GO:  Was the  test performed?  No    Cognitive Function:    04/03/2022   12:37 PM  MMSE - Mini Mental State Exam  Orientation to time 5  Orientation to Place 5  Registration 3  Attention/ Calculation 5  Recall 3  Language- name 2 objects 2  Language- repeat 1  Language- follow 3 step command 3  Language- read & follow direction 1  Write a sentence 1  Copy design 1  Total score 30        09/03/2023   12:02 PM 11/17/2020    9:32 AM  6CIT Screen  What Year? 0 points 0 points  What month? 0 points 0 points  What time? 0 points 0 points  Count back from 20 0 points 0 points  Months in reverse 0 points 0 points  Repeat phrase 0 points 0 points  Total Score 0 points 0 points    Immunizations Immunization History  Administered Date(s) Administered   PFIZER(Purple Top)SARS-COV-2 Vaccination 04/27/2019, 05/18/2019   Pneumococcal Polysaccharide-23 03/30/2018   Tdap 03/16/2023   Zoster, Live 04/06/2018    TDAP status: Up to date  Flu Vaccine status: Due, Education has been provided regarding the importance of this vaccine. Advised may receive this vaccine at local pharmacy or Health Dept. Aware to provide a copy of the vaccination record if obtained from local pharmacy or Health Dept. Verbalized acceptance and understanding.  Pt doesn't get the  flu vaccine  Pneumococcal vaccine status: Due, Education has been provided regarding the importance of this vaccine. Advised may receive this vaccine at local pharmacy or Health Dept. Aware to provide a copy of the vaccination record if obtained from local pharmacy or Health Dept. Verbalized acceptance and understanding.  Pt is due for last pcv vaccine  Covid-19 vaccine status: Completed vaccines  Pt only received 2 Covid vaccines  Qualifies for Shingles Vaccine? Yes   Zostavax completed No   Shingrix Completed?: No.    Education has been provided regarding the importance of this vaccine. Patient has been advised to call insurance company to  determine out of pocket expense if they have not yet received this vaccine. Advised may also receive vaccine at local pharmacy or Health Dept. Verbalized acceptance and understanding.  Screening Tests Health Maintenance  Topic Date Due   Hepatitis B Vaccines (1 of 3 - 19+ 3-dose series) Never done   Zoster Vaccines- Shingrix (1 of 2) 12/31/2015   Pneumococcal Vaccine 64-4 Years old (2 of 2 - PCV) 03/31/2019   COVID-19 Vaccine (3 - 2024-25 season) 10/18/2022   Diabetic kidney evaluation - Urine ACR  06/03/2023   INFLUENZA VACCINE  09/17/2023   HEMOGLOBIN A1C  02/25/2024   OPHTHALMOLOGY EXAM  07/25/2024   Diabetic kidney evaluation - eGFR measurement  08/21/2024   FOOT EXAM  08/24/2024   Medicare Annual Wellness (AWV)  09/02/2024   Colonoscopy  04/06/2028   DTaP/Tdap/Td (2 - Td or Tdap) 03/15/2033   Hepatitis C Screening  Completed   HIV Screening  Completed   HPV VACCINES  Aged Out   Meningococcal B Vaccine  Aged Out    Health Maintenance  Health Maintenance Due  Topic Date Due   Hepatitis B Vaccines (1 of 3 - 19+ 3-dose series) Never done   Zoster Vaccines- Shingrix (1 of 2) 12/31/2015   Pneumococcal Vaccine 36-24 Years old (2 of 2 - PCV) 03/31/2019   COVID-19 Vaccine (3 - 2024-25 season) 10/18/2022   Diabetic kidney evaluation - Urine ACR  06/03/2023    Colorectal cancer screening: Type of screening: Colonoscopy. Completed 04/06/2018. Repeat every 10 years  Lung Cancer Screening: (Low Dose CT Chest recommended if Age 18-80 years, 20 pack-year currently smoking OR have quit w/in 15years.) does qualify.   Lung Cancer Screening Referral: Will defer to provider   Additional Screening:  Hepatitis C Screening: does qualify; Completed 03/17/2017  Vision Screening: Recommended annual ophthalmology exams for early detection of glaucoma and other disorders of the eye. Is the patient up to date with their annual eye exam?  Yes  Who is the provider or what is the name of the office  in which the patient attends annual eye exams? [facility]  If pt is not established with a provider, would they like to be referred to a provider to establish care? No .   Dental Screening: Recommended annual dental exams for proper oral hygiene  Diabetic Foot Exam: Diabetic Foot Exam: Completed 08/25/2023  Community Resource Referral / Chronic Care Management: CRR required this visit?  No   CCM required this visit?  No     Plan:     I have personally reviewed and noted the following in the patient's chart:   Medical and social history Use of alcohol, tobacco or illicit drugs  Current medications and supplements including opioid prescriptions. Patient is not currently taking opioid prescriptions. Functional ability and status Nutritional status Physical activity Advanced directives List of other physicians Hospitalizations, surgeries, and ER visits in previous 12 months Vitals Screenings to include cognitive, depression, and falls Referrals and appointments  In addition, I have reviewed and discussed with patient certain preventive protocols, quality metrics, and best practice recommendations. A written personalized care plan for preventive services as well as general preventive health recommendations were provided to patient.  Kyle Novak, RMA   09/03/2023   After Visit Summary: (Mail) Due to this being a telephonic visit, the after visit summary with patients personalized plan was offered to patient via mail   Nurse Notes: 30 mins

## 2023-09-03 NOTE — Patient Instructions (Signed)
 Kyle Novak , Thank you for taking time out of your busy schedule to complete your Annual Wellness Visit with me. I enjoyed our conversation and look forward to speaking with you again next year. I, as well as your care team,  appreciate your ongoing commitment to your health goals. Please review the following plan we discussed and let me know if I can assist you in the future. Your Game plan/ To Do List     Follow up Visits: Next Medicare AWV with our clinical staff: 1 year  Have you seen your provider in the last 6 months (3 months if uncontrolled diabetes)? Yes Next Office Visit with your provider: 09/22/23  Clinician Recommendations:  Aim for 30 minutes of exercise or brisk walking, 6-8 glasses of water, and 5 servings of fruits and vegetables each day.       This is a list of the screening recommended for you and due dates:  Health Maintenance  Topic Date Due   Hepatitis B Vaccine (1 of 3 - 19+ 3-dose series) Never done   Zoster (Shingles) Vaccine (1 of 2) 12/31/2015   Pneumococcal Vaccination (2 of 2 - PCV) 03/31/2019   COVID-19 Vaccine (3 - 2024-25 season) 10/18/2022   Yearly kidney health urinalysis for diabetes  06/03/2023   Flu Shot  09/17/2023   Hemoglobin A1C  02/25/2024   Eye exam for diabetics  07/25/2024   Yearly kidney function blood test for diabetes  08/21/2024   Complete foot exam   08/24/2024   Medicare Annual Wellness Visit  09/02/2024   Colon Cancer Screening  04/06/2028   DTaP/Tdap/Td vaccine (2 - Td or Tdap) 03/15/2033   Hepatitis C Screening  Completed   HIV Screening  Completed   HPV Vaccine  Aged Out   Meningitis B Vaccine  Aged Out    Advanced directives: (Declined) Advance directive discussed with you today. Even though you declined this today, please call our office should you change your mind, and we can give you the proper paperwork for you to fill out. Advance Care Planning is important because it:  [x]  Makes sure you receive the medical care that  is consistent with your values, goals, and preferences  [x]  It provides guidance to your family and loved ones and reduces their decisional burden about whether or not they are making the right decisions based on your wishes.  Follow the link provided in your after visit summary or read over the paperwork we have mailed to you to help you started getting your Advance Directives in place. If you need assistance in completing these, please reach out to us  so that we can help you!  See attachments for Preventive Care and Fall Prevention Tips.

## 2023-09-17 ENCOUNTER — Other Ambulatory Visit (INDEPENDENT_AMBULATORY_CARE_PROVIDER_SITE_OTHER): Payer: Self-pay | Admitting: Primary Care

## 2023-09-17 DIAGNOSIS — E119 Type 2 diabetes mellitus without complications: Secondary | ICD-10-CM

## 2023-09-17 NOTE — Telephone Encounter (Signed)
 Requested Prescriptions  Pending Prescriptions Disp Refills   Dulaglutide  (TRULICITY ) 0.75 MG/0.5ML SOAJ [Pharmacy Med Name: TRULICITY  0.75 MG/0.5ML SUBCUTANEOUS SOLUTION AUTO-INJECTOR] 2 mL 2    Sig: INJECT 0.75 MG INTO THE SKIN ONCE A WEEK.     Endocrinology:  Diabetes - GLP-1 Receptor Agonists Passed - 09/17/2023  5:57 PM      Passed - HBA1C is between 0 and 7.9 and within 180 days    HbA1c, POC (prediabetic range)  Date Value Ref Range Status  08/25/2023 6.8 (A) 5.7 - 6.4 % Final         Passed - Valid encounter within last 6 months    Recent Outpatient Visits           3 weeks ago Hospital discharge follow-up   [place] [facility] Celestia Rosaline SQUIBB, NP   5 months ago Flu-like symptoms   [place] [facility] Celestia Rosaline SQUIBB, NP   6 months ago Diabetes mellitus without complication [facility])   [place] [facility] Celestia Rosaline SQUIBB, NP   1 year ago Diabetes mellitus without complication [facility])   [place] [facility] Celestia Rosaline SQUIBB, NP   1 year ago Diabetes mellitus without complication [facility])   [place] [facility] Celestia Rosaline SQUIBB, NP

## 2023-09-22 ENCOUNTER — Ambulatory Visit (INDEPENDENT_AMBULATORY_CARE_PROVIDER_SITE_OTHER): Admitting: Primary Care

## 2023-09-22 ENCOUNTER — Encounter (INDEPENDENT_AMBULATORY_CARE_PROVIDER_SITE_OTHER): Payer: Self-pay | Admitting: Primary Care

## 2023-09-22 VITALS — BP 134/82 | HR 97 | Ht 69.0 in | Wt 173.0 lb

## 2023-09-22 DIAGNOSIS — Z7984 Long term (current) use of oral hypoglycemic drugs: Secondary | ICD-10-CM

## 2023-09-22 DIAGNOSIS — I739 Peripheral vascular disease, unspecified: Secondary | ICD-10-CM

## 2023-09-22 DIAGNOSIS — I10 Essential (primary) hypertension: Secondary | ICD-10-CM

## 2023-09-22 DIAGNOSIS — E1142 Type 2 diabetes mellitus with diabetic polyneuropathy: Secondary | ICD-10-CM | POA: Diagnosis not present

## 2023-09-22 DIAGNOSIS — G4700 Insomnia, unspecified: Secondary | ICD-10-CM

## 2023-09-22 DIAGNOSIS — C61 Malignant neoplasm of prostate: Secondary | ICD-10-CM | POA: Insufficient documentation

## 2023-09-22 DIAGNOSIS — Z7985 Long-term (current) use of injectable non-insulin antidiabetic drugs: Secondary | ICD-10-CM

## 2023-09-22 MED ORDER — DULOXETINE HCL 60 MG PO CPEP: ORAL_CAPSULE | ORAL | 1 refills | Status: AC

## 2023-09-22 MED ORDER — METFORMIN HCL ER 500 MG PO TB24: 500.0000 mg | ORAL_TABLET | Freq: Every day | ORAL | 1 refills | Status: DC

## 2023-09-22 MED ORDER — AMLODIPINE BESYLATE 10 MG PO TABS: 10.0000 mg | ORAL_TABLET | Freq: Every day | ORAL | 1 refills | Status: AC

## 2023-09-22 NOTE — Progress Notes (Signed)
 [facility]   Kyle Novak is a 58 y.o. male presents for hypertension evaluation, Denies shortness of breath, headaches, chest pain or lower extremity edema, sudden onset, vision changes, unilateral weakness, dizziness, paresthesias   Patient reports adherence with medications.  Dietary habits include: everything  Exercise habits include:walks Family / Social history: mother CVA-42 and father MI 9   Past Medical History:  Diagnosis Date   Diabetes mellitus without complication (HCC)    GERD (gastroesophageal reflux disease)    Hyperlipidemia    Hypertension    Past Surgical History:  Procedure Laterality Date   KNEE ARTHROSCOPY WITH MEDIAL MENISECTOMY Right 11/23/2018   Procedure: RIGHT KNEE ARTHROSCOPY WITH PARTIAL MEDIAL MENISCECTOMY, SYNOVECTOMY;  Surgeon: Jerri Kay HERO, MD;  Location: [facility];  Service: Orthopedics;  Laterality: Right;   NO PAST SURGERIES     WISDOM TOOTH EXTRACTION     No Known Allergies Current Outpatient Medications on File Prior to Visit  Medication Sig Dispense Refill   amLODipine  (NORVASC ) 10 MG tablet TAKE 1 TABLET (10 MG TOTAL) BY MOUTH DAILY. 90 tablet 1   Dulaglutide  (TRULICITY ) 0.75 MG/0.5ML SOAJ INJECT 0.75 MG INTO THE SKIN ONCE A WEEK. 2 mL 2   DULoxetine  (CYMBALTA ) 60 MG capsule TAKE 1 (ONE) CAPSULE (60 MG TOTAL) BY MOUTH DAILY. 90 capsule 1   gabapentin  (NEURONTIN ) 600 MG tablet Take 2 tablets (1,200 mg total) by mouth 3 (three) times daily. 540 tablet 1   losartan  (COZAAR ) 50 MG tablet Take 1 tablet (50 mg total) by mouth daily. 90 tablet 1   metFORMIN  (GLUCOPHAGE -XR) 500 MG 24 hr tablet Take 1 tablet (500 mg total) by mouth daily with breakfast. 90 tablet 1   omeprazole  (PRILOSEC) 20 MG capsule TAKE 1 CAPSULE (20 MG TOTAL) BY MOUTH DAILY. 90 capsule 0   pravastatin  (PRAVACHOL ) 10 MG tablet Take 1 tablet (10 mg total) by mouth daily. 90 tablet 1   tadalafil  (CIALIS ) 20 MG tablet Take 1 tablet (20 mg  total) by mouth every other day as needed for erectile dysfunction. 30 tablet 3   Current Facility-Administered Medications on File Prior to Visit  Medication Dose Route Frequency Provider Last Rate Last Admin   acetaminophen  (TYLENOL ) tablet 650 mg  650 mg Oral Q4H PRN Bryan Agent, MD       Social History   Socioeconomic History   Marital status: Single    Spouse name: Not on file   Number of children: Not on file   Years of education: Not on file   Highest education level: Not on file  Occupational History   Not on file  Tobacco Use   Smoking status: Every Day    Current packs/day: 0.50    Types: Cigarettes   Smokeless tobacco: Never  Vaping Use   Vaping status: Never Used  Substance and Sexual Activity   Alcohol use: Yes    Comment: OCCASIONAL   Drug use: No   Sexual activity: Not on file  Other Topics Concern   Not on file  Social History Narrative   Not on file   Social Drivers of Health   Financial Resource Strain: Low Risk  (11/17/2020)   Overall Financial Resource Strain (CARDIA)    Difficulty of Paying Living Expenses: Not hard at all  Food Insecurity: No Food Insecurity (03/30/2023)   Hunger Vital Sign    Worried About Running Out of Food in the Last Year: Never true    Ran Out of Food in  the Last Year: Never true  Transportation Needs: No Transportation Needs (03/30/2023)   PRAPARE - Administrator, Civil Service (Medical): No    Lack of Transportation (Non-Medical): No  Physical Activity: Unknown (11/17/2020)   Exercise Vital Sign    Days of Exercise per Week: Not on file    Minutes of Exercise per Session: 30 min  Stress: No Stress Concern Present (11/17/2020)   [employer] of Occupational Health - Occupational Stress Questionnaire    Feeling of Stress : Not at all  Social Connections: Unknown (11/17/2020)   Social Connection and Isolation Panel    Frequency of Communication with Friends and Family: More than three times a week     Frequency of Social Gatherings with Friends and Family: More than three times a week    Attends Religious Services: Never    Database administrator or Organizations: No    Attends Banker Meetings: Never    Marital Status: Not on file  Intimate Partner Violence: Not At Risk (03/30/2023)   Humiliation, Afraid, Rape, and Kick questionnaire    Fear of Current or Ex-Partner: No    Emotionally Abused: No    Physically Abused: No    Sexually Abused: No   Family History  Problem Relation Age of Onset   Lung cancer Father    Heart failure Brother    Health Maintenance  Topic Date Due   Hepatitis B Vaccines (1 of 3 - 19+ 3-dose series) Never done   Zoster Vaccines- Shingrix (1 of 2) 12/31/2015   Pneumococcal Vaccine: 19-49 Years (2 of 2 - PCV) 03/31/2019   Pneumococcal Vaccine: 50+ Years (2 of 2 - PCV) 03/31/2019   COVID-19 Vaccine (3 - 2024-25 season) 10/18/2022   Diabetic kidney evaluation - Urine ACR  06/03/2023   INFLUENZA VACCINE  09/17/2023   HEMOGLOBIN A1C  02/25/2024   OPHTHALMOLOGY EXAM  07/25/2024   Diabetic kidney evaluation - eGFR measurement  08/21/2024   FOOT EXAM  08/24/2024   Medicare Annual Wellness (AWV)  09/02/2024   Colonoscopy  04/06/2028   DTaP/Tdap/Td (2 - Td or Tdap) 03/15/2033   Hepatitis C Screening  Completed   HIV Screening  Completed   HPV VACCINES  Aged Out   Meningococcal B Vaccine  Aged Out     OBJECTIVE: BP 134/82 (Cuff Size: Normal)     Physical Exam Constitutional:      Appearance: Normal appearance. He is normal weight.  HENT:     Head: Normocephalic.     Right Ear: Tympanic membrane, ear canal and external ear normal.     Left Ear: Tympanic membrane, ear canal and external ear normal.     Nose: Nose normal.  Eyes:     Extraocular Movements: Extraocular movements intact.     Pupils: Pupils are equal, round, and reactive to light.  Cardiovascular:     Rate and Rhythm: Normal rate and regular rhythm.  Pulmonary:      Effort: Pulmonary effort is normal.     Breath sounds: Normal breath sounds.  Abdominal:     General: Bowel sounds are normal. There is distension.     Palpations: Abdomen is soft.  Musculoskeletal:        General: Normal range of motion.     Cervical back: Normal range of motion.  Skin:    General: Skin is warm and dry.  Neurological:     Mental Status: He is alert and oriented to person, place, and  time.  Psychiatric:        Mood and Affect: Mood normal.        Behavior: Behavior normal.        Thought Content: Thought content normal.        Judgment: Judgment normal.      ROS  Last 3 Office BP readings: BP Readings from Last 3 Encounters:  08/25/23 (!) 140/92  08/22/23 128/87  05/21/23 129/83    BMET    Component Value Date/Time   NA 137 08/22/2023 1840   NA 141 02/23/2023 1632   K 4.8 08/22/2023 1840   CL 101 08/22/2023 1840   CO2 25 08/22/2023 1840   GLUCOSE 172 (H) 08/22/2023 1840   BUN 15 08/22/2023 1840   BUN 12 02/23/2023 1632   CREATININE 0.95 08/22/2023 1840   CALCIUM  9.9 08/22/2023 1840   GFRNONAA >60 08/22/2023 1840   GFRAA >60 07/22/2019 1127    Renal function: CrCl cannot be calculated (Patient's most recent lab result is older than the maximum 21 days allowed.).  Clinical ASCVD: Yes  The 10-year ASCVD risk score (Arnett DK, et al., 2019) is: 36.6%   Values used to calculate the score:     Age: 65 years     Clinically relevant sex: Male     Is Non-Hispanic African American: Yes     Diabetic: Yes     Tobacco smoker: Yes     Systolic Blood Pressure: 140 mmHg     Is BP treated: Yes     HDL Cholesterol: 55 mg/dL     Total Cholesterol: 165 mg/dL  ASCVD risk factors include- ITALY   ASSESSMENT & PLAN: Diagnoses and all orders for this visit:  Primary hypertension Improving with medication change   MI father 22 -   -     amLODipine  (NORVASC ) 10 MG tablet; Take 1 tablet (10 mg total) by mouth daily.  PVD (peripheral vascular disease)  (HCC) 2/ 2 Hypertension, unspecified type -Counseled on lifestyle modifications for blood pressure control including reduced dietary sodium, increased exercise, weight reduction and adequate sleep. Also, educated patient about the risk for cardiovascular events, stroke and heart attack. Also counseled patient about the importance of medication adherence. If you participate in smoking, it is important to stop using tobacco as this will increase the risks associated with uncontrolled blood pressure.  Clinical ASCVD: Yes  The 10-year ASCVD risk score (Arnett DK, et al., 2019) is: 36.6%   Values used to calculate the score:     Age: 66 years     Clincally relevant sex: Male     Is Non-Hispanic African American: Yes     Diabetic: Yes     Tobacco smoker: Yes     Systolic Blood Pressure: 140 mmHg     Is BP treated: Yes     HDL Cholesterol: 55 mg/dL     Total Cholesterol: 165 mg/dL Family hx MI  -     Ambulatory referral to Cardiology  Insomnia, unspecified type -     DULoxetine  (CYMBALTA ) 60 MG capsule; TAKE 1 (ONE) CAPSULE (60 MG TOTAL) BY MOUTH DAILY.  -     amLODipine  (NORVASC ) 10 MG tablet; Take 1 tablet (10 mg total) by mouth daily.  Diabetic polyneuropathy associated with type 2 diabetes mellitus (HCC) -     metFORMIN  (GLUCOPHAGE -XR) 500 MG 24 hr tablet; Take 1 tablet (500 mg total) by mouth daily with breakfast.     This note has been created with Dragon speech  Office manager. Any transcriptional errors are unintentional.   [name], NP 09/22/2023, 3:12 PM

## 2023-10-11 ENCOUNTER — Other Ambulatory Visit: Payer: Self-pay | Admitting: Family Medicine

## 2023-10-11 ENCOUNTER — Other Ambulatory Visit (INDEPENDENT_AMBULATORY_CARE_PROVIDER_SITE_OTHER): Payer: Self-pay | Admitting: Primary Care

## 2023-10-11 DIAGNOSIS — I10 Essential (primary) hypertension: Secondary | ICD-10-CM

## 2023-10-12 NOTE — Telephone Encounter (Signed)
 Requested Prescriptions  Refused Prescriptions Disp Refills   pravastatin  (PRAVACHOL ) 10 MG tablet [Pharmacy Med Name: PRAVASTATIN  SODIUM 10 MG ORAL TABLET] 90 tablet 1    Sig: TAKE 1 TABLET (10 MG TOTAL) BY MOUTH DAILY.     Cardiovascular:  Antilipid - Statins Failed - 10/12/2023  5:42 PM      Failed - Lipid Panel in normal range within the last 12 months    Cholesterol, Total  Date Value Ref Range Status  02/23/2023 165 100 - 199 mg/dL Final   LDL Chol Calc (NIH)  Date Value Ref Range Status  02/23/2023 92 0 - 99 mg/dL Final   HDL  Date Value Ref Range Status  02/23/2023 55 >39 mg/dL Final   Triglycerides  Date Value Ref Range Status  02/23/2023 101 0 - 149 mg/dL Final         Passed - Patient is not pregnant      Passed - Valid encounter within last 12 months    Recent Outpatient Visits           2 weeks ago Primary hypertension   [place] [facility] Celestia Rosaline SQUIBB, NP   1 month ago Hospital discharge follow-up   [place] [facility] Celestia Rosaline SQUIBB, NP   6 months ago Flu-like symptoms   [place] [facility] Celestia Rosaline SQUIBB, NP   7 months ago Diabetes mellitus without complication [facility])   [place] [facility] Celestia Rosaline SQUIBB, NP   1 year ago Diabetes mellitus without complication [facility])   [place] [facility] Celestia Rosaline SQUIBB, NP

## 2023-11-25 ENCOUNTER — Other Ambulatory Visit: Payer: Self-pay | Admitting: Pharmacist

## 2023-11-25 NOTE — Progress Notes (Signed)
 Pharmacy Quality Measure Review  This patient is appearing on a report for being at risk of failing the adherence measure for cholesterol (statin) medications this calendar year.   Medication: pravastatin  Last fill date: 10/11/23 for 30 day supply  Contacted pharmacy to facilitate refills. I contacted them last week and pt has still not filled the medication.   [name], PharmD, [name], CPP Clinical Pharmacist [facility] & [facility] [phone]

## 2023-12-10 ENCOUNTER — Other Ambulatory Visit (INDEPENDENT_AMBULATORY_CARE_PROVIDER_SITE_OTHER): Payer: Self-pay | Admitting: Primary Care

## 2023-12-10 DIAGNOSIS — E119 Type 2 diabetes mellitus without complications: Secondary | ICD-10-CM

## 2023-12-28 ENCOUNTER — Ambulatory Visit (INDEPENDENT_AMBULATORY_CARE_PROVIDER_SITE_OTHER): Admitting: Primary Care

## 2024-01-11 NOTE — Progress Notes (Signed)
 Kyle Novak                                          MRN: [id number]   01/11/2024   The VBCI Quality Team Specialist reviewed this patient medical record for the purposes of chart review for care gap closure. The following were reviewed: chart review for care gap closure-kidney health evaluation for diabetes:eGFR  and uACR.    VBCI Quality Team

## 2024-01-12 ENCOUNTER — Other Ambulatory Visit: Payer: Self-pay | Admitting: Pharmacist

## 2024-01-12 MED ORDER — PRAVASTATIN SODIUM 10 MG PO TABS: 10.0000 mg | ORAL_TABLET | Freq: Every day | ORAL | 1 refills | Status: AC

## 2024-01-12 NOTE — Progress Notes (Signed)
 Pharmacy Quality Measure Review  This patient is appearing on a report for being at risk of failing the adherence measure for cholesterol (statin) medications this calendar year.   Medication: pravastatin  Last fill date: 10/11/23 for 90 day supply  Contacted pharmacy to facilitate refills. Current rxn is out of refills. I sent a new rxn today. Will set reminder to make sure this was filled next week.   [name], PharmD, [name], CPP Clinical Pharmacist [facility] [phone]

## 2024-01-19 ENCOUNTER — Other Ambulatory Visit: Payer: Self-pay | Admitting: Pharmacist

## 2024-01-19 NOTE — Progress Notes (Signed)
 Pharmacy Quality Measure Review  This patient is appearing on a report for being at risk of failing the adherence measure for cholesterol (statin) medications this calendar year.   Medication: pravastatin  Last fill date: 10/11/23 for 90 day supply  Contacted pharmacy to facilitate refills. They filled this for him last week (01/12/24) but he has not picked-up. Contacted patient and left VM encouraging him to pick-up.  [name], PharmD, [name], CPP Clinical Pharmacist [facility] & [facility] [phone]

## 2024-01-21 ENCOUNTER — Other Ambulatory Visit (INDEPENDENT_AMBULATORY_CARE_PROVIDER_SITE_OTHER): Payer: Self-pay | Admitting: Primary Care

## 2024-01-27 ENCOUNTER — Ambulatory Visit (INDEPENDENT_AMBULATORY_CARE_PROVIDER_SITE_OTHER): Admitting: Primary Care

## 2024-01-27 ENCOUNTER — Encounter (INDEPENDENT_AMBULATORY_CARE_PROVIDER_SITE_OTHER): Payer: Self-pay | Admitting: Primary Care

## 2024-01-27 VITALS — BP 137/93 | HR 78 | Resp 16 | Wt 168.6 lb

## 2024-01-27 DIAGNOSIS — R7303 Prediabetes: Secondary | ICD-10-CM | POA: Diagnosis not present

## 2024-01-27 DIAGNOSIS — Z1211 Encounter for screening for malignant neoplasm of colon: Secondary | ICD-10-CM | POA: Diagnosis not present

## 2024-01-27 DIAGNOSIS — I10 Essential (primary) hypertension: Secondary | ICD-10-CM

## 2024-01-30 ENCOUNTER — Encounter (INDEPENDENT_AMBULATORY_CARE_PROVIDER_SITE_OTHER): Payer: Self-pay | Admitting: Primary Care

## 2024-01-30 NOTE — Progress Notes (Signed)
 [facility]  Kyle Novak, is a 58 y.o. male  RDW:[id number]  FMW:[id number]  DOB - 1965/04/13  Chief Complaint  Patient presents with   Hypertension   Prediabetes       Subjective:   Kyle Novak is a 58 y.o. male here today for a follow up visit. HTN-Patient has No headache, No chest pain, No abdominal pain - No Nausea, No new weakness tingling or numbness, No Cough - shortness of breath. Prediabetes -Denies polyuria, polydipsia, polyphasia or vision changes.  Does not check blood sugars at home.  Hypertension    No problems updated.  Comprehensive ROS Pertinent positive and negative noted in HPI   Allergies[1]  Past Medical History:  Diagnosis Date   Diabetes mellitus without complication (HCC)    GERD (gastroesophageal reflux disease)    Hyperlipidemia    Hypertension     Medications Ordered Prior to Encounter[2] Health Maintenance  Topic Date Due   Zoster (Shingles) Vaccine (1 of 2) 12/30/1984   Colon Cancer Screening  Never done   COVID-19 Vaccine (3 - Pfizer risk series) 06/15/2019   Yearly kidney health urinalysis for diabetes  06/03/2023   Flu Shot  05/16/2024*   Pneumococcal Vaccine for age over 83 (2 of 2 - PCV) 09/21/2024*   Hepatitis B Vaccine (1 of 3 - 19+ 3-dose series) 09/21/2024*   Hemoglobin A1C  02/25/2024   Eye exam for diabetics  07/25/2024   Yearly kidney function blood test for diabetes  08/21/2024   Complete foot exam   08/24/2024   Medicare Annual Wellness Visit  09/02/2024   DTaP/Tdap/Td vaccine (2 - Td or Tdap) 03/15/2033   Hepatitis C Screening  Completed   HIV Screening  Completed   HPV Vaccine  Aged Out   Meningitis B Vaccine  Aged Out  *Topic was postponed. The date shown is not the original due date.    Objective:   Vitals:   01/27/24 1620  BP: (!) 137/93  Pulse: 78  Resp: 16  SpO2: 99%  Weight: 168 lb 9.6 oz (76.5 kg)   BP Readings from Last 3 Encounters:  01/27/24 (!) 137/93  09/22/23 134/82   08/25/23 (!) 140/92      Physical Exam Vitals reviewed.  Constitutional:      Appearance: Normal appearance. He is normal weight.  HENT:     Head: Normocephalic.     Right Ear: Tympanic membrane, ear canal and external ear normal.     Left Ear: Tympanic membrane, ear canal and external ear normal.     Nose: Nose normal.  Eyes:     Extraocular Movements: Extraocular movements intact.     Pupils: Pupils are equal, round, and reactive to light.  Cardiovascular:     Rate and Rhythm: Normal rate and regular rhythm.  Pulmonary:     Effort: Pulmonary effort is normal.     Breath sounds: Normal breath sounds.  Abdominal:     General: Bowel sounds are normal. There is distension.     Palpations: Abdomen is soft.  Musculoskeletal:        General: Normal range of motion.     Cervical back: Normal range of motion.  Skin:    General: Skin is warm and dry.  Neurological:     Mental Status: He is oriented to person, place, and time.  Psychiatric:        Mood and Affect: Mood normal.        Behavior: Behavior normal.  Thought Content: Thought content normal.        Judgment: Judgment normal.       Assessment & Plan  Kyle Novak was seen today for hypertension and prediabetes.  Diagnoses and all orders for this visit:  Screening for colon cancer -     Cologuard  Prediabetes -     CBC with Differential/Platelet; Future -     Hemoglobin A1c; Future  Primary hypertension BP goal - < 130/80 Explained that having normal blood pressure is the goal and medications are helping to get to goal and maintain normal blood pressure. DIET: Limit salt intake, read nutrition labels to check salt content, limit fried and high fatty foods  Avoid using multisymptom OTC cold preparations that generally contain sudafed which can rise BP. Consult with pharmacist on best cold relief products to use for persons with HTN EXERCISE Discussed incorporating exercise such as walking - 30 minutes most  days of the week and can do in 10 minute intervals    -     CMP14+EGFR; Future    Patient have been counseled extensively about nutrition and exercise. Other issues discussed during this visit include: low cholesterol diet, weight control and daily exercise, foot care, annual eye examinations at Ophthalmology, importance of adherence with medications and regular follow-up. We also discussed long term complications of uncontrolled diabetes and hypertension.   Return in about 3 months (around 04/26/2024) for fasting labs.  The patient was given clear instructions to go to ER or return to medical center if symptoms don't improve, worsen or new problems develop. The patient verbalized understanding. The patient was told to call to get lab results if they haven't heard anything in the next week.   This note has been created with Education officer, environmental. Any transcriptional errors are unintentional.   Kyle SHAUNNA Bohr, NP 01/30/2024, 11:38 PM    [1] No Known Allergies [2]  Current Outpatient Medications on File Prior to Visit  Medication Sig Dispense Refill   amLODipine  (NORVASC ) 10 MG tablet Take 1 tablet (10 mg total) by mouth daily. 90 tablet 1   DULoxetine  (CYMBALTA ) 60 MG capsule TAKE 1 (ONE) CAPSULE (60 MG TOTAL) BY MOUTH DAILY. 90 capsule 1   gabapentin  (NEURONTIN ) 600 MG tablet Take 2 tablets (1,200 mg total) by mouth 3 (three) times daily. 540 tablet 1   losartan  (COZAAR ) 50 MG tablet TAKE 1 TABLET (50 MG TOTAL) BY MOUTH DAILY. 90 tablet 1   metFORMIN  (GLUCOPHAGE -XR) 500 MG 24 hr tablet Take 1 tablet (500 mg total) by mouth daily with breakfast. 90 tablet 1   omeprazole  (PRILOSEC) 20 MG capsule TAKE 1 CAPSULE (20 MG TOTAL) BY MOUTH DAILY. 90 capsule 0   pravastatin  (PRAVACHOL ) 10 MG tablet Take 1 tablet (10 mg total) by mouth daily. 90 tablet 1   tadalafil  (CIALIS ) 20 MG tablet Take 1 tablet (20 mg total) by mouth every other day as needed for  erectile dysfunction. 30 tablet 3   TRULICITY  0.75 MG/0.5ML SOAJ INJECT 0.75 MG INTO THE SKIN ONCE A WEEK. 2 mL 2   Current Facility-Administered Medications on File Prior to Visit  Medication Dose Route Frequency Provider Last Rate Last Admin   acetaminophen  (TYLENOL ) tablet 650 mg  650 mg Oral Q4H PRN Bryan Agent, MD

## 2024-02-21 DIAGNOSIS — E1142 Type 2 diabetes mellitus with diabetic polyneuropathy: Secondary | ICD-10-CM

## 2024-03-01 ENCOUNTER — Other Ambulatory Visit: Payer: Self-pay | Admitting: Pharmacist

## 2024-03-01 NOTE — Progress Notes (Addendum)
 Pharmacy Quality Measure Review  This patient is appearing on a report for being at risk of failing the adherence measure for cholesterol (statin) medications this calendar year.   Medication: pravastatin  Last fill date: 01/12/24 for 90 day supply  Reminder set for 04/08/24 fill.   [name], PharmD, [name], CPP Clinical Pharmacist [facility] [phone]

## 2024-03-10 LAB — COLOGUARD: COLOGUARD: NEGATIVE

## 2024-03-12 ENCOUNTER — Ambulatory Visit (INDEPENDENT_AMBULATORY_CARE_PROVIDER_SITE_OTHER): Payer: Self-pay | Admitting: Primary Care

## 2024-03-15 ENCOUNTER — Ambulatory Visit: Payer: Self-pay

## 2024-03-15 NOTE — Telephone Encounter (Signed)
 Will forward to provider

## 2024-03-15 NOTE — Telephone Encounter (Signed)
 FYI Only or Action Required?: FYI only for provider: appointment scheduled on 1/29.  Patient was last seen in primary care on 01/27/2024 by Celestia Rosaline SQUIBB, NP.  Called Nurse Triage reporting Neurologic Problem.  Symptoms began 2 weeks ago.  Interventions attempted: Prescription medications: gabapentin .  Symptoms are: gradually worsening.  Triage Disposition: See HCP Within 4 Hours (Or PCP Triage)  Patient/caregiver understands and will follow disposition?: Yes, but will wait  Reason for Triage: pt is having numbness in left leg as well unbalanced  Reason for Disposition  [1] Weakness of the face, arm / hand, or leg / foot on one side of the body AND [2] gradual onset (e.g., days to weeks) AND [3] present now  Answer Assessment - Initial Assessment Questions Patient requesting new referral to neuro. Only wants to see pcp.   1. SYMPTOM: What is the main symptom you are concerned about? (e.g., weakness, numbness)     Numbness, tingling left leg 2. ONSET: When did this start? (e.g., minutes, hours, days; while sleeping)     Few weeks ago 3. LAST NORMAL: When was the last time you (the patient) were normal (no symptoms)?     Few weeks 4. PATTERN Does this come and go, or has it been constant since it started?  Is it present now?     constant  Protocols used: Neurologic Deficit-A-AH

## 2024-03-16 ENCOUNTER — Encounter (INDEPENDENT_AMBULATORY_CARE_PROVIDER_SITE_OTHER): Payer: Self-pay | Admitting: Primary Care

## 2024-03-16 ENCOUNTER — Ambulatory Visit (INDEPENDENT_AMBULATORY_CARE_PROVIDER_SITE_OTHER): Admitting: Primary Care

## 2024-03-16 VITALS — BP 136/78 | HR 110 | Resp 16 | Ht 69.0 in | Wt 172.8 lb

## 2024-03-16 DIAGNOSIS — E119 Type 2 diabetes mellitus without complications: Secondary | ICD-10-CM

## 2024-03-16 DIAGNOSIS — E1142 Type 2 diabetes mellitus with diabetic polyneuropathy: Secondary | ICD-10-CM | POA: Diagnosis not present

## 2024-03-16 DIAGNOSIS — I10 Essential (primary) hypertension: Secondary | ICD-10-CM

## 2024-03-16 DIAGNOSIS — G4700 Insomnia, unspecified: Secondary | ICD-10-CM

## 2024-03-17 ENCOUNTER — Other Ambulatory Visit (INDEPENDENT_AMBULATORY_CARE_PROVIDER_SITE_OTHER): Payer: Self-pay | Admitting: Primary Care

## 2024-03-17 DIAGNOSIS — E119 Type 2 diabetes mellitus without complications: Secondary | ICD-10-CM

## 2024-03-20 NOTE — Telephone Encounter (Signed)
 Will forward to provider

## 2024-03-23 NOTE — Telephone Encounter (Signed)
 Will forward to provider
